# Patient Record
Sex: Female | Born: 1940 | Race: White | Hispanic: No | Smoking: Former smoker
Health system: Southern US, Community
[De-identification: ages and names within clinical notes are randomized; demographics above are authoritative.]

## PROBLEM LIST (undated history)

## (undated) DIAGNOSIS — N63 Unspecified lump in unspecified breast: Secondary | ICD-10-CM

## (undated) DIAGNOSIS — M1990 Unspecified osteoarthritis, unspecified site: Secondary | ICD-10-CM

## (undated) DIAGNOSIS — K219 Gastro-esophageal reflux disease without esophagitis: Secondary | ICD-10-CM

## (undated) DIAGNOSIS — N83209 Unspecified ovarian cyst, unspecified side: Secondary | ICD-10-CM

## (undated) DIAGNOSIS — F419 Anxiety disorder, unspecified: Secondary | ICD-10-CM

## (undated) DIAGNOSIS — M797 Fibromyalgia: Secondary | ICD-10-CM

## (undated) DIAGNOSIS — I6522 Occlusion and stenosis of left carotid artery: Secondary | ICD-10-CM

## (undated) DIAGNOSIS — I10 Essential (primary) hypertension: Secondary | ICD-10-CM

## (undated) DIAGNOSIS — J449 Chronic obstructive pulmonary disease, unspecified: Secondary | ICD-10-CM

## (undated) DIAGNOSIS — Z973 Presence of spectacles and contact lenses: Secondary | ICD-10-CM

## (undated) DIAGNOSIS — G629 Polyneuropathy, unspecified: Secondary | ICD-10-CM

## (undated) DIAGNOSIS — F32A Depression, unspecified: Secondary | ICD-10-CM

## (undated) DIAGNOSIS — F329 Major depressive disorder, single episode, unspecified: Secondary | ICD-10-CM

## (undated) DIAGNOSIS — K760 Fatty (change of) liver, not elsewhere classified: Secondary | ICD-10-CM

## (undated) DIAGNOSIS — C50919 Malignant neoplasm of unspecified site of unspecified female breast: Secondary | ICD-10-CM

## (undated) DIAGNOSIS — J45909 Unspecified asthma, uncomplicated: Secondary | ICD-10-CM

## (undated) DIAGNOSIS — K5790 Diverticulosis of intestine, part unspecified, without perforation or abscess without bleeding: Secondary | ICD-10-CM

## (undated) DIAGNOSIS — Z8719 Personal history of other diseases of the digestive system: Secondary | ICD-10-CM

## (undated) DIAGNOSIS — G43909 Migraine, unspecified, not intractable, without status migrainosus: Secondary | ICD-10-CM

## (undated) DIAGNOSIS — B009 Herpesviral infection, unspecified: Secondary | ICD-10-CM

## (undated) HISTORY — PX: MYOMECTOMY: SHX85

## (undated) HISTORY — DX: Unspecified ovarian cyst, unspecified side: N83.209

## (undated) HISTORY — PX: CARPAL TUNNEL RELEASE: SHX101

## (undated) HISTORY — DX: Depression, unspecified: F32.A

## (undated) HISTORY — DX: Unspecified lump in unspecified breast: N63.0

## (undated) HISTORY — DX: Diverticulosis of intestine, part unspecified, without perforation or abscess without bleeding: K57.90

## (undated) HISTORY — DX: Polyneuropathy, unspecified: G62.9

## (undated) HISTORY — PX: TONSILLECTOMY: SUR1361

## (undated) HISTORY — DX: Herpesviral infection, unspecified: B00.9

## (undated) HISTORY — DX: Essential (primary) hypertension: I10

## (undated) HISTORY — PX: ABDOMINAL HYSTERECTOMY: SHX81

## (undated) HISTORY — PX: BACK SURGERY: SHX140

## (undated) HISTORY — DX: Major depressive disorder, single episode, unspecified: F32.9

## (undated) HISTORY — PX: APPENDECTOMY: SHX54

## (undated) HISTORY — PX: BREAST DUCTAL SYSTEM EXCISION: SHX5242

---

## 1997-05-18 ENCOUNTER — Other Ambulatory Visit: Admission: RE | Admit: 1997-05-18 | Discharge: 1997-05-18 | Payer: Self-pay | Admitting: *Deleted

## 1997-06-01 ENCOUNTER — Other Ambulatory Visit: Admission: RE | Admit: 1997-06-01 | Discharge: 1997-06-01 | Payer: Self-pay | Admitting: *Deleted

## 1997-09-20 ENCOUNTER — Encounter: Admission: RE | Admit: 1997-09-20 | Discharge: 1997-12-19 | Payer: Self-pay

## 1998-03-28 ENCOUNTER — Inpatient Hospital Stay (HOSPITAL_COMMUNITY): Admission: AD | Admit: 1998-03-28 | Discharge: 1998-04-01 | Payer: Self-pay | Admitting: *Deleted

## 1998-03-28 ENCOUNTER — Encounter: Payer: Self-pay | Admitting: *Deleted

## 1999-05-09 ENCOUNTER — Encounter: Admission: RE | Admit: 1999-05-09 | Discharge: 1999-05-09 | Payer: Self-pay | Admitting: Family Medicine

## 1999-05-09 ENCOUNTER — Encounter: Payer: Self-pay | Admitting: Family Medicine

## 1999-05-23 ENCOUNTER — Encounter: Payer: Self-pay | Admitting: Family Medicine

## 1999-05-23 ENCOUNTER — Encounter: Admission: RE | Admit: 1999-05-23 | Discharge: 1999-05-23 | Payer: Self-pay | Admitting: Family Medicine

## 1999-07-21 ENCOUNTER — Encounter: Payer: Self-pay | Admitting: Family Medicine

## 1999-07-21 ENCOUNTER — Ambulatory Visit (HOSPITAL_COMMUNITY): Admission: RE | Admit: 1999-07-21 | Discharge: 1999-07-21 | Payer: Self-pay | Admitting: Family Medicine

## 2001-02-18 ENCOUNTER — Encounter: Payer: Self-pay | Admitting: Internal Medicine

## 2001-02-18 ENCOUNTER — Ambulatory Visit (HOSPITAL_COMMUNITY): Admission: RE | Admit: 2001-02-18 | Discharge: 2001-02-18 | Payer: Self-pay | Admitting: Internal Medicine

## 2001-02-18 DIAGNOSIS — K648 Other hemorrhoids: Secondary | ICD-10-CM | POA: Insufficient documentation

## 2001-02-18 DIAGNOSIS — K5730 Diverticulosis of large intestine without perforation or abscess without bleeding: Secondary | ICD-10-CM | POA: Insufficient documentation

## 2001-02-18 DIAGNOSIS — K2940 Chronic atrophic gastritis without bleeding: Secondary | ICD-10-CM | POA: Insufficient documentation

## 2001-04-28 ENCOUNTER — Ambulatory Visit (HOSPITAL_COMMUNITY): Admission: RE | Admit: 2001-04-28 | Discharge: 2001-04-28 | Payer: Self-pay | Admitting: Family Medicine

## 2001-04-28 ENCOUNTER — Encounter: Payer: Self-pay | Admitting: Family Medicine

## 2001-06-20 ENCOUNTER — Encounter: Payer: Self-pay | Admitting: Neurological Surgery

## 2001-06-20 ENCOUNTER — Encounter: Admission: RE | Admit: 2001-06-20 | Discharge: 2001-06-20 | Payer: Self-pay | Admitting: Neurological Surgery

## 2001-09-01 ENCOUNTER — Inpatient Hospital Stay (HOSPITAL_COMMUNITY): Admission: EM | Admit: 2001-09-01 | Discharge: 2001-09-06 | Payer: Self-pay | Admitting: Emergency Medicine

## 2001-09-01 ENCOUNTER — Encounter: Payer: Self-pay | Admitting: Emergency Medicine

## 2001-09-02 ENCOUNTER — Encounter: Payer: Self-pay | Admitting: Internal Medicine

## 2001-09-04 ENCOUNTER — Encounter: Payer: Self-pay | Admitting: Internal Medicine

## 2001-09-05 ENCOUNTER — Encounter: Payer: Self-pay | Admitting: Internal Medicine

## 2001-10-08 ENCOUNTER — Encounter: Admission: RE | Admit: 2001-10-08 | Discharge: 2001-10-08 | Payer: Self-pay | Admitting: Family Medicine

## 2001-10-08 ENCOUNTER — Encounter: Payer: Self-pay | Admitting: Family Medicine

## 2002-03-05 ENCOUNTER — Encounter: Payer: Self-pay | Admitting: Neurological Surgery

## 2002-03-05 ENCOUNTER — Encounter: Admission: RE | Admit: 2002-03-05 | Discharge: 2002-03-05 | Payer: Self-pay | Admitting: Neurological Surgery

## 2002-09-14 ENCOUNTER — Ambulatory Visit (HOSPITAL_COMMUNITY): Admission: RE | Admit: 2002-09-14 | Discharge: 2002-09-14 | Payer: Self-pay | Admitting: Family Medicine

## 2002-09-14 ENCOUNTER — Encounter: Payer: Self-pay | Admitting: Family Medicine

## 2002-12-21 ENCOUNTER — Emergency Department (HOSPITAL_COMMUNITY): Admission: EM | Admit: 2002-12-21 | Discharge: 2002-12-21 | Payer: Self-pay | Admitting: Emergency Medicine

## 2003-08-05 ENCOUNTER — Inpatient Hospital Stay (HOSPITAL_COMMUNITY): Admission: AD | Admit: 2003-08-05 | Discharge: 2003-08-12 | Payer: Self-pay | Admitting: Internal Medicine

## 2003-08-10 ENCOUNTER — Encounter (INDEPENDENT_AMBULATORY_CARE_PROVIDER_SITE_OTHER): Payer: Self-pay | Admitting: Cardiology

## 2003-08-26 ENCOUNTER — Encounter: Admission: RE | Admit: 2003-08-26 | Discharge: 2003-08-26 | Payer: Self-pay | Admitting: Internal Medicine

## 2003-09-02 HISTORY — PX: ANTERIOR CERVICAL DECOMP/DISCECTOMY FUSION: SHX1161

## 2004-01-07 ENCOUNTER — Ambulatory Visit: Payer: Self-pay | Admitting: Internal Medicine

## 2004-01-14 ENCOUNTER — Ambulatory Visit: Payer: Self-pay | Admitting: Family Medicine

## 2004-01-24 ENCOUNTER — Ambulatory Visit: Payer: Self-pay | Admitting: Internal Medicine

## 2004-02-10 ENCOUNTER — Ambulatory Visit: Payer: Self-pay | Admitting: Internal Medicine

## 2004-02-14 ENCOUNTER — Ambulatory Visit: Payer: Self-pay | Admitting: Family Medicine

## 2004-03-03 ENCOUNTER — Ambulatory Visit: Payer: Self-pay | Admitting: Family Medicine

## 2004-04-05 ENCOUNTER — Ambulatory Visit: Payer: Self-pay | Admitting: Family Medicine

## 2004-04-07 ENCOUNTER — Encounter: Admission: RE | Admit: 2004-04-07 | Discharge: 2004-04-07 | Payer: Self-pay | Admitting: Family Medicine

## 2004-04-21 ENCOUNTER — Encounter: Admission: RE | Admit: 2004-04-21 | Discharge: 2004-04-21 | Payer: Self-pay | Admitting: Family Medicine

## 2004-05-12 ENCOUNTER — Encounter: Admission: RE | Admit: 2004-05-12 | Discharge: 2004-05-12 | Payer: Self-pay | Admitting: Family Medicine

## 2004-07-14 ENCOUNTER — Emergency Department (HOSPITAL_COMMUNITY): Admission: EM | Admit: 2004-07-14 | Discharge: 2004-07-14 | Payer: Self-pay | Admitting: Emergency Medicine

## 2004-07-17 ENCOUNTER — Ambulatory Visit (HOSPITAL_COMMUNITY): Admission: RE | Admit: 2004-07-17 | Discharge: 2004-07-17 | Payer: Self-pay | Admitting: Anesthesiology

## 2004-08-30 ENCOUNTER — Ambulatory Visit: Payer: Self-pay | Admitting: Family Medicine

## 2004-09-01 ENCOUNTER — Ambulatory Visit: Payer: Self-pay | Admitting: Internal Medicine

## 2004-09-05 ENCOUNTER — Encounter (INDEPENDENT_AMBULATORY_CARE_PROVIDER_SITE_OTHER): Payer: Self-pay | Admitting: Specialist

## 2004-09-05 ENCOUNTER — Ambulatory Visit: Payer: Self-pay | Admitting: Internal Medicine

## 2004-09-05 DIAGNOSIS — K449 Diaphragmatic hernia without obstruction or gangrene: Secondary | ICD-10-CM | POA: Insufficient documentation

## 2004-09-05 DIAGNOSIS — K209 Esophagitis, unspecified without bleeding: Secondary | ICD-10-CM | POA: Insufficient documentation

## 2004-09-14 ENCOUNTER — Inpatient Hospital Stay (HOSPITAL_COMMUNITY): Admission: RE | Admit: 2004-09-14 | Discharge: 2004-09-16 | Payer: Self-pay | Admitting: Neurological Surgery

## 2004-10-01 ENCOUNTER — Emergency Department (HOSPITAL_COMMUNITY): Admission: EM | Admit: 2004-10-01 | Discharge: 2004-10-02 | Payer: Self-pay | Admitting: Emergency Medicine

## 2004-10-20 ENCOUNTER — Ambulatory Visit: Payer: Self-pay | Admitting: Family Medicine

## 2004-11-27 ENCOUNTER — Encounter: Admission: RE | Admit: 2004-11-27 | Discharge: 2005-02-25 | Payer: Self-pay | Admitting: Neurological Surgery

## 2005-01-22 ENCOUNTER — Ambulatory Visit (HOSPITAL_COMMUNITY): Admission: RE | Admit: 2005-01-22 | Discharge: 2005-01-22 | Payer: Self-pay | Admitting: Neurological Surgery

## 2005-01-26 ENCOUNTER — Ambulatory Visit (HOSPITAL_COMMUNITY): Admission: RE | Admit: 2005-01-26 | Discharge: 2005-01-26 | Payer: Self-pay | Admitting: Neurological Surgery

## 2005-02-08 ENCOUNTER — Ambulatory Visit: Payer: Self-pay | Admitting: Family Medicine

## 2005-02-15 ENCOUNTER — Ambulatory Visit: Payer: Self-pay | Admitting: Family Medicine

## 2005-04-05 ENCOUNTER — Ambulatory Visit: Payer: Self-pay | Admitting: Family Medicine

## 2005-04-30 ENCOUNTER — Ambulatory Visit: Payer: Self-pay | Admitting: Family Medicine

## 2005-05-07 ENCOUNTER — Ambulatory Visit: Payer: Self-pay | Admitting: Internal Medicine

## 2005-09-05 ENCOUNTER — Ambulatory Visit: Payer: Self-pay | Admitting: Family Medicine

## 2005-09-26 ENCOUNTER — Inpatient Hospital Stay (HOSPITAL_COMMUNITY): Admission: RE | Admit: 2005-09-26 | Discharge: 2005-09-28 | Payer: Self-pay | Admitting: Orthopaedic Surgery

## 2005-11-09 ENCOUNTER — Ambulatory Visit: Payer: Self-pay | Admitting: Family Medicine

## 2006-01-24 ENCOUNTER — Ambulatory Visit: Payer: Self-pay | Admitting: Family Medicine

## 2006-01-24 LAB — CONVERTED CEMR LAB

## 2006-01-31 ENCOUNTER — Encounter: Admission: RE | Admit: 2006-01-31 | Discharge: 2006-01-31 | Payer: Self-pay | Admitting: Family Medicine

## 2006-02-25 ENCOUNTER — Ambulatory Visit: Payer: Self-pay | Admitting: Family Medicine

## 2006-03-07 ENCOUNTER — Encounter: Admission: RE | Admit: 2006-03-07 | Discharge: 2006-03-07 | Payer: Self-pay | Admitting: Orthopaedic Surgery

## 2006-03-14 ENCOUNTER — Encounter: Admission: RE | Admit: 2006-03-14 | Discharge: 2006-03-14 | Payer: Self-pay | Admitting: Orthopaedic Surgery

## 2006-06-19 ENCOUNTER — Ambulatory Visit (HOSPITAL_COMMUNITY): Admission: RE | Admit: 2006-06-19 | Discharge: 2006-06-19 | Payer: Self-pay | Admitting: Orthopaedic Surgery

## 2006-06-19 ENCOUNTER — Ambulatory Visit: Payer: Self-pay | Admitting: Vascular Surgery

## 2006-06-19 ENCOUNTER — Ambulatory Visit: Payer: Self-pay | Admitting: Family Medicine

## 2006-06-19 ENCOUNTER — Encounter (INDEPENDENT_AMBULATORY_CARE_PROVIDER_SITE_OTHER): Payer: Self-pay | Admitting: Orthopaedic Surgery

## 2006-07-10 ENCOUNTER — Encounter: Payer: Self-pay | Admitting: Family Medicine

## 2006-08-06 ENCOUNTER — Telehealth (INDEPENDENT_AMBULATORY_CARE_PROVIDER_SITE_OTHER): Payer: Self-pay | Admitting: *Deleted

## 2006-09-04 ENCOUNTER — Ambulatory Visit: Payer: Self-pay | Admitting: Family Medicine

## 2006-09-04 DIAGNOSIS — M47812 Spondylosis without myelopathy or radiculopathy, cervical region: Secondary | ICD-10-CM | POA: Insufficient documentation

## 2006-09-04 DIAGNOSIS — G43009 Migraine without aura, not intractable, without status migrainosus: Secondary | ICD-10-CM | POA: Insufficient documentation

## 2006-09-04 DIAGNOSIS — E785 Hyperlipidemia, unspecified: Secondary | ICD-10-CM | POA: Insufficient documentation

## 2006-09-04 DIAGNOSIS — I10 Essential (primary) hypertension: Secondary | ICD-10-CM

## 2006-09-04 DIAGNOSIS — Z8679 Personal history of other diseases of the circulatory system: Secondary | ICD-10-CM | POA: Insufficient documentation

## 2006-09-04 DIAGNOSIS — E11649 Type 2 diabetes mellitus with hypoglycemia without coma: Secondary | ICD-10-CM | POA: Insufficient documentation

## 2006-09-04 DIAGNOSIS — M1990 Unspecified osteoarthritis, unspecified site: Secondary | ICD-10-CM | POA: Insufficient documentation

## 2006-09-04 LAB — CONVERTED CEMR LAB
Glucose, Urine, Semiquant: NEGATIVE
Protein, U semiquant: NEGATIVE
WBC Urine, dipstick: NEGATIVE
pH: 6.5

## 2006-09-10 ENCOUNTER — Encounter: Payer: Self-pay | Admitting: Family Medicine

## 2006-09-10 ENCOUNTER — Ambulatory Visit: Payer: Self-pay

## 2006-09-17 ENCOUNTER — Telehealth: Payer: Self-pay | Admitting: Family Medicine

## 2006-09-17 ENCOUNTER — Encounter: Payer: Self-pay | Admitting: Family Medicine

## 2006-09-17 DIAGNOSIS — K5289 Other specified noninfective gastroenteritis and colitis: Secondary | ICD-10-CM

## 2006-09-17 LAB — CONVERTED CEMR LAB
Basophils Relative: 0.7 % (ref 0.0–1.0)
CO2: 32 meq/L (ref 19–32)
Creatinine, Ser: 0.5 mg/dL (ref 0.4–1.2)
Creatinine,U: 69 mg/dL
Eosinophils Relative: 2.6 % (ref 0.0–5.0)
Glucose, Bld: 125 mg/dL — ABNORMAL HIGH (ref 70–99)
HCT: 40.3 % (ref 36.0–46.0)
Hemoglobin: 14.4 g/dL (ref 12.0–15.0)
MCHC: 35.7 g/dL (ref 30.0–36.0)
Microalb, Ur: 1.3 mg/dL (ref 0.0–1.9)
Monocytes Absolute: 0.4 10*3/uL (ref 0.2–0.7)
Neutrophils Relative %: 52 % (ref 43.0–77.0)
Potassium: 3.3 meq/L — ABNORMAL LOW (ref 3.5–5.1)
RDW: 13.1 % (ref 11.5–14.6)
Sodium: 143 meq/L (ref 135–145)
TSH: 1.58 microintl units/mL (ref 0.35–5.50)
Total Bilirubin: 0.8 mg/dL (ref 0.3–1.2)
Total CHOL/HDL Ratio: 4.9
Total Protein: 6.4 g/dL (ref 6.0–8.3)
VLDL: 50 mg/dL — ABNORMAL HIGH (ref 0–40)
WBC: 4.5 10*3/uL (ref 4.5–10.5)

## 2006-09-19 ENCOUNTER — Ambulatory Visit: Payer: Self-pay

## 2006-09-19 ENCOUNTER — Ambulatory Visit: Payer: Self-pay | Admitting: Family Medicine

## 2006-09-20 ENCOUNTER — Encounter (INDEPENDENT_AMBULATORY_CARE_PROVIDER_SITE_OTHER): Payer: Self-pay | Admitting: *Deleted

## 2006-10-03 ENCOUNTER — Ambulatory Visit: Payer: Self-pay | Admitting: Family Medicine

## 2006-10-15 ENCOUNTER — Telehealth (INDEPENDENT_AMBULATORY_CARE_PROVIDER_SITE_OTHER): Payer: Self-pay | Admitting: *Deleted

## 2006-10-16 ENCOUNTER — Ambulatory Visit: Payer: Self-pay | Admitting: Family Medicine

## 2006-10-16 DIAGNOSIS — J019 Acute sinusitis, unspecified: Secondary | ICD-10-CM

## 2006-10-22 ENCOUNTER — Telehealth (INDEPENDENT_AMBULATORY_CARE_PROVIDER_SITE_OTHER): Payer: Self-pay | Admitting: *Deleted

## 2006-10-30 ENCOUNTER — Ambulatory Visit: Payer: Self-pay | Admitting: Internal Medicine

## 2006-11-01 ENCOUNTER — Ambulatory Visit: Payer: Self-pay | Admitting: Internal Medicine

## 2006-11-05 ENCOUNTER — Telehealth (INDEPENDENT_AMBULATORY_CARE_PROVIDER_SITE_OTHER): Payer: Self-pay | Admitting: *Deleted

## 2006-11-27 ENCOUNTER — Ambulatory Visit: Payer: Self-pay | Admitting: Family Medicine

## 2006-12-09 ENCOUNTER — Ambulatory Visit: Payer: Self-pay | Admitting: Family Medicine

## 2006-12-16 ENCOUNTER — Ambulatory Visit: Payer: Self-pay | Admitting: Family Medicine

## 2006-12-19 ENCOUNTER — Telehealth (INDEPENDENT_AMBULATORY_CARE_PROVIDER_SITE_OTHER): Payer: Self-pay | Admitting: *Deleted

## 2006-12-24 LAB — CONVERTED CEMR LAB
ALT: 39 units/L — ABNORMAL HIGH (ref 0–35)
AST: 29 units/L (ref 0–37)
Alkaline Phosphatase: 186 units/L — ABNORMAL HIGH (ref 39–117)
HDL: 44.8 mg/dL (ref >39.0)
LDL Cholesterol: 117 mg/dL — ABNORMAL HIGH (ref 0–99)
Total Protein: 7.3 g/dL (ref 6.0–8.3)

## 2006-12-25 ENCOUNTER — Encounter (INDEPENDENT_AMBULATORY_CARE_PROVIDER_SITE_OTHER): Payer: Self-pay | Admitting: *Deleted

## 2006-12-30 ENCOUNTER — Encounter: Admission: RE | Admit: 2006-12-30 | Discharge: 2006-12-30 | Payer: Self-pay | Admitting: Internal Medicine

## 2007-01-06 ENCOUNTER — Ambulatory Visit: Payer: Self-pay | Admitting: Internal Medicine

## 2007-01-07 ENCOUNTER — Telehealth (INDEPENDENT_AMBULATORY_CARE_PROVIDER_SITE_OTHER): Payer: Self-pay | Admitting: *Deleted

## 2007-01-08 ENCOUNTER — Encounter: Payer: Self-pay | Admitting: Family Medicine

## 2007-01-08 ENCOUNTER — Ambulatory Visit: Payer: Self-pay | Admitting: Internal Medicine

## 2007-01-10 ENCOUNTER — Telehealth (INDEPENDENT_AMBULATORY_CARE_PROVIDER_SITE_OTHER): Payer: Self-pay | Admitting: *Deleted

## 2007-01-10 DIAGNOSIS — R945 Abnormal results of liver function studies: Secondary | ICD-10-CM

## 2007-01-14 ENCOUNTER — Ambulatory Visit: Payer: Self-pay | Admitting: Family Medicine

## 2007-01-16 ENCOUNTER — Telehealth (INDEPENDENT_AMBULATORY_CARE_PROVIDER_SITE_OTHER): Payer: Self-pay | Admitting: *Deleted

## 2007-01-24 ENCOUNTER — Encounter: Admission: RE | Admit: 2007-01-24 | Discharge: 2007-01-24 | Payer: Self-pay | Admitting: Family Medicine

## 2007-01-28 ENCOUNTER — Telehealth (INDEPENDENT_AMBULATORY_CARE_PROVIDER_SITE_OTHER): Payer: Self-pay | Admitting: *Deleted

## 2007-01-30 ENCOUNTER — Telehealth (INDEPENDENT_AMBULATORY_CARE_PROVIDER_SITE_OTHER): Payer: Self-pay | Admitting: *Deleted

## 2007-01-31 ENCOUNTER — Ambulatory Visit: Payer: Self-pay | Admitting: Family Medicine

## 2007-01-31 DIAGNOSIS — E782 Mixed hyperlipidemia: Secondary | ICD-10-CM | POA: Insufficient documentation

## 2007-02-14 ENCOUNTER — Encounter: Payer: Self-pay | Admitting: Family Medicine

## 2007-02-19 ENCOUNTER — Encounter: Payer: Self-pay | Admitting: Family Medicine

## 2007-02-20 ENCOUNTER — Telehealth (INDEPENDENT_AMBULATORY_CARE_PROVIDER_SITE_OTHER): Payer: Self-pay | Admitting: *Deleted

## 2007-02-21 ENCOUNTER — Ambulatory Visit: Payer: Self-pay | Admitting: Family Medicine

## 2007-02-21 DIAGNOSIS — J209 Acute bronchitis, unspecified: Secondary | ICD-10-CM

## 2007-02-25 ENCOUNTER — Telehealth (INDEPENDENT_AMBULATORY_CARE_PROVIDER_SITE_OTHER): Payer: Self-pay | Admitting: *Deleted

## 2007-02-25 ENCOUNTER — Ambulatory Visit: Payer: Self-pay | Admitting: Family Medicine

## 2007-02-26 DIAGNOSIS — IMO0002 Reserved for concepts with insufficient information to code with codable children: Secondary | ICD-10-CM

## 2007-02-26 DIAGNOSIS — J45909 Unspecified asthma, uncomplicated: Secondary | ICD-10-CM | POA: Insufficient documentation

## 2007-02-26 DIAGNOSIS — G43909 Migraine, unspecified, not intractable, without status migrainosus: Secondary | ICD-10-CM | POA: Insufficient documentation

## 2007-02-26 DIAGNOSIS — E669 Obesity, unspecified: Secondary | ICD-10-CM | POA: Insufficient documentation

## 2007-02-26 DIAGNOSIS — F329 Major depressive disorder, single episode, unspecified: Secondary | ICD-10-CM

## 2007-02-26 DIAGNOSIS — K219 Gastro-esophageal reflux disease without esophagitis: Secondary | ICD-10-CM

## 2007-02-26 DIAGNOSIS — IMO0001 Reserved for inherently not codable concepts without codable children: Secondary | ICD-10-CM

## 2007-02-26 DIAGNOSIS — J309 Allergic rhinitis, unspecified: Secondary | ICD-10-CM | POA: Insufficient documentation

## 2007-02-26 DIAGNOSIS — Z8679 Personal history of other diseases of the circulatory system: Secondary | ICD-10-CM

## 2007-02-28 ENCOUNTER — Telehealth (INDEPENDENT_AMBULATORY_CARE_PROVIDER_SITE_OTHER): Payer: Self-pay | Admitting: *Deleted

## 2007-03-07 ENCOUNTER — Telehealth (INDEPENDENT_AMBULATORY_CARE_PROVIDER_SITE_OTHER): Payer: Self-pay | Admitting: *Deleted

## 2007-03-14 ENCOUNTER — Telehealth (INDEPENDENT_AMBULATORY_CARE_PROVIDER_SITE_OTHER): Payer: Self-pay | Admitting: *Deleted

## 2007-03-20 ENCOUNTER — Telehealth: Payer: Self-pay | Admitting: Family Medicine

## 2007-03-25 ENCOUNTER — Ambulatory Visit: Payer: Self-pay | Admitting: Family Medicine

## 2007-03-27 ENCOUNTER — Telehealth (INDEPENDENT_AMBULATORY_CARE_PROVIDER_SITE_OTHER): Payer: Self-pay | Admitting: *Deleted

## 2007-03-28 ENCOUNTER — Ambulatory Visit: Payer: Self-pay | Admitting: Family Medicine

## 2007-04-05 LAB — CONVERTED CEMR LAB
Albumin: 3.3 g/dL — ABNORMAL LOW (ref 3.5–5.2)
BUN: 19 mg/dL (ref 6–23)
Cholesterol: 173 mg/dL (ref 0–200)
Creatinine, Ser: 0.7 mg/dL (ref 0.4–1.2)
GFR calc Af Amer: 108 mL/min
GFR calc non Af Amer: 89 mL/min
Hgb A1c MFr Bld: 9 % — ABNORMAL HIGH (ref 4.6–6.0)
LDL Cholesterol: 92 mg/dL (ref 0–99)
Potassium: 4.5 meq/L (ref 3.5–5.1)
Total CHOL/HDL Ratio: 3.9
Triglycerides: 184 mg/dL — ABNORMAL HIGH (ref 0–149)
VLDL: 37 mg/dL (ref 0–40)

## 2007-04-07 ENCOUNTER — Encounter (INDEPENDENT_AMBULATORY_CARE_PROVIDER_SITE_OTHER): Payer: Self-pay | Admitting: *Deleted

## 2007-04-08 ENCOUNTER — Telehealth: Payer: Self-pay | Admitting: Family Medicine

## 2007-04-08 ENCOUNTER — Encounter (INDEPENDENT_AMBULATORY_CARE_PROVIDER_SITE_OTHER): Payer: Self-pay | Admitting: *Deleted

## 2007-04-15 ENCOUNTER — Ambulatory Visit: Payer: Self-pay | Admitting: Family Medicine

## 2007-04-15 ENCOUNTER — Encounter (INDEPENDENT_AMBULATORY_CARE_PROVIDER_SITE_OTHER): Payer: Self-pay | Admitting: *Deleted

## 2007-04-15 ENCOUNTER — Telehealth (INDEPENDENT_AMBULATORY_CARE_PROVIDER_SITE_OTHER): Payer: Self-pay | Admitting: *Deleted

## 2007-04-16 ENCOUNTER — Telehealth (INDEPENDENT_AMBULATORY_CARE_PROVIDER_SITE_OTHER): Payer: Self-pay | Admitting: *Deleted

## 2007-04-16 ENCOUNTER — Encounter: Payer: Self-pay | Admitting: Internal Medicine

## 2007-04-16 ENCOUNTER — Encounter (INDEPENDENT_AMBULATORY_CARE_PROVIDER_SITE_OTHER): Payer: Self-pay | Admitting: *Deleted

## 2007-04-16 LAB — CONVERTED CEMR LAB
Alkaline Phosphatase: 114 units/L (ref 39–117)
Bilirubin, Direct: 0.1 mg/dL (ref 0.0–0.3)

## 2007-04-17 ENCOUNTER — Encounter (INDEPENDENT_AMBULATORY_CARE_PROVIDER_SITE_OTHER): Payer: Self-pay | Admitting: *Deleted

## 2007-04-18 ENCOUNTER — Telehealth (INDEPENDENT_AMBULATORY_CARE_PROVIDER_SITE_OTHER): Payer: Self-pay | Admitting: *Deleted

## 2007-04-21 ENCOUNTER — Ambulatory Visit: Payer: Self-pay | Admitting: Family Medicine

## 2007-04-21 DIAGNOSIS — R5381 Other malaise: Secondary | ICD-10-CM | POA: Insufficient documentation

## 2007-04-21 DIAGNOSIS — R5383 Other fatigue: Secondary | ICD-10-CM

## 2007-04-21 DIAGNOSIS — R609 Edema, unspecified: Secondary | ICD-10-CM

## 2007-04-21 DIAGNOSIS — I739 Peripheral vascular disease, unspecified: Secondary | ICD-10-CM

## 2007-04-22 LAB — CONVERTED CEMR LAB
BUN: 16 mg/dL (ref 6–23)
Basophils Absolute: 0 10*3/uL (ref 0.0–0.1)
Basophils Relative: 0.1 % (ref 0.0–1.0)
Eosinophils Absolute: 0.3 10*3/uL (ref 0.0–0.7)
GFR calc Af Amer: 108 mL/min
Glucose, Bld: 374 mg/dL — ABNORMAL HIGH (ref 70–99)
HCT: 43.5 % (ref 36.0–46.0)
Hemoglobin: 14.9 g/dL (ref 12.0–15.0)
MCHC: 34.3 g/dL (ref 30.0–36.0)
MCV: 98.1 fL (ref 78.0–100.0)
Neutro Abs: 0.8 10*3/uL — ABNORMAL LOW (ref 1.4–7.7)
Potassium: 4 meq/L (ref 3.5–5.1)
RBC: 4.44 M/uL (ref 3.87–5.11)

## 2007-04-23 ENCOUNTER — Ambulatory Visit: Payer: Self-pay

## 2007-04-23 ENCOUNTER — Encounter: Payer: Self-pay | Admitting: Family Medicine

## 2007-04-23 ENCOUNTER — Encounter (INDEPENDENT_AMBULATORY_CARE_PROVIDER_SITE_OTHER): Payer: Self-pay | Admitting: *Deleted

## 2007-04-29 ENCOUNTER — Ambulatory Visit: Payer: Self-pay | Admitting: Internal Medicine

## 2007-04-29 ENCOUNTER — Encounter: Payer: Self-pay | Admitting: Internal Medicine

## 2007-05-01 ENCOUNTER — Encounter: Payer: Self-pay | Admitting: Family Medicine

## 2007-05-02 ENCOUNTER — Telehealth (INDEPENDENT_AMBULATORY_CARE_PROVIDER_SITE_OTHER): Payer: Self-pay | Admitting: *Deleted

## 2007-05-05 ENCOUNTER — Telehealth (INDEPENDENT_AMBULATORY_CARE_PROVIDER_SITE_OTHER): Payer: Self-pay | Admitting: *Deleted

## 2007-05-05 ENCOUNTER — Ambulatory Visit: Payer: Self-pay | Admitting: Internal Medicine

## 2007-05-06 ENCOUNTER — Encounter: Payer: Self-pay | Admitting: Family Medicine

## 2007-05-08 ENCOUNTER — Telehealth (INDEPENDENT_AMBULATORY_CARE_PROVIDER_SITE_OTHER): Payer: Self-pay | Admitting: *Deleted

## 2007-06-02 ENCOUNTER — Telehealth (INDEPENDENT_AMBULATORY_CARE_PROVIDER_SITE_OTHER): Payer: Self-pay | Admitting: *Deleted

## 2007-06-06 ENCOUNTER — Ambulatory Visit: Payer: Self-pay | Admitting: Family Medicine

## 2007-06-06 ENCOUNTER — Telehealth (INDEPENDENT_AMBULATORY_CARE_PROVIDER_SITE_OTHER): Payer: Self-pay | Admitting: *Deleted

## 2007-06-09 ENCOUNTER — Encounter (INDEPENDENT_AMBULATORY_CARE_PROVIDER_SITE_OTHER): Payer: Self-pay | Admitting: *Deleted

## 2007-06-12 ENCOUNTER — Ambulatory Visit: Payer: Self-pay | Admitting: Family Medicine

## 2007-06-16 ENCOUNTER — Telehealth (INDEPENDENT_AMBULATORY_CARE_PROVIDER_SITE_OTHER): Payer: Self-pay | Admitting: *Deleted

## 2007-06-17 ENCOUNTER — Encounter (INDEPENDENT_AMBULATORY_CARE_PROVIDER_SITE_OTHER): Payer: Self-pay | Admitting: *Deleted

## 2007-06-25 ENCOUNTER — Ambulatory Visit: Payer: Self-pay | Admitting: Internal Medicine

## 2007-06-25 ENCOUNTER — Telehealth (INDEPENDENT_AMBULATORY_CARE_PROVIDER_SITE_OTHER): Payer: Self-pay | Admitting: *Deleted

## 2007-06-25 DIAGNOSIS — Z87891 Personal history of nicotine dependence: Secondary | ICD-10-CM

## 2007-06-25 DIAGNOSIS — R059 Cough, unspecified: Secondary | ICD-10-CM | POA: Insufficient documentation

## 2007-06-25 DIAGNOSIS — R05 Cough: Secondary | ICD-10-CM

## 2007-06-30 ENCOUNTER — Telehealth (INDEPENDENT_AMBULATORY_CARE_PROVIDER_SITE_OTHER): Payer: Self-pay | Admitting: *Deleted

## 2007-07-07 ENCOUNTER — Telehealth (INDEPENDENT_AMBULATORY_CARE_PROVIDER_SITE_OTHER): Payer: Self-pay | Admitting: *Deleted

## 2007-07-08 ENCOUNTER — Ambulatory Visit: Payer: Self-pay | Admitting: Family Medicine

## 2007-07-08 DIAGNOSIS — J069 Acute upper respiratory infection, unspecified: Secondary | ICD-10-CM | POA: Insufficient documentation

## 2007-07-09 ENCOUNTER — Telehealth (INDEPENDENT_AMBULATORY_CARE_PROVIDER_SITE_OTHER): Payer: Self-pay | Admitting: *Deleted

## 2007-07-10 ENCOUNTER — Ambulatory Visit: Payer: Self-pay | Admitting: Internal Medicine

## 2007-07-10 DIAGNOSIS — B370 Candidal stomatitis: Secondary | ICD-10-CM

## 2007-07-16 ENCOUNTER — Telehealth: Payer: Self-pay | Admitting: Internal Medicine

## 2007-07-21 ENCOUNTER — Ambulatory Visit: Payer: Self-pay | Admitting: Internal Medicine

## 2007-07-23 LAB — CONVERTED CEMR LAB
Basophils Absolute: 0.1 10*3/uL (ref 0.0–0.1)
Basophils Relative: 1.6 % (ref 0.0–3.0)
Eosinophils Absolute: 0.1 10*3/uL (ref 0.0–0.7)
MCHC: 35.2 g/dL (ref 30.0–36.0)
MCV: 95 fL (ref 78.0–100.0)
Monocytes Absolute: 0.4 10*3/uL (ref 0.1–1.0)
Neutro Abs: 3.4 10*3/uL (ref 1.4–7.7)
Neutrophils Relative %: 58.5 % (ref 43.0–77.0)
RBC: 4.56 M/uL (ref 3.87–5.11)

## 2007-07-24 ENCOUNTER — Ambulatory Visit: Payer: Self-pay | Admitting: Internal Medicine

## 2007-07-29 ENCOUNTER — Encounter: Payer: Self-pay | Admitting: Family Medicine

## 2007-08-11 ENCOUNTER — Ambulatory Visit: Payer: Self-pay | Admitting: Pulmonary Disease

## 2007-08-11 ENCOUNTER — Telehealth (INDEPENDENT_AMBULATORY_CARE_PROVIDER_SITE_OTHER): Payer: Self-pay | Admitting: *Deleted

## 2007-08-13 ENCOUNTER — Ambulatory Visit: Payer: Self-pay | Admitting: Cardiology

## 2007-08-15 ENCOUNTER — Telehealth (INDEPENDENT_AMBULATORY_CARE_PROVIDER_SITE_OTHER): Payer: Self-pay | Admitting: *Deleted

## 2007-09-02 ENCOUNTER — Ambulatory Visit: Payer: Self-pay | Admitting: Pulmonary Disease

## 2007-09-02 ENCOUNTER — Telehealth (INDEPENDENT_AMBULATORY_CARE_PROVIDER_SITE_OTHER): Payer: Self-pay | Admitting: *Deleted

## 2007-09-04 ENCOUNTER — Telehealth (INDEPENDENT_AMBULATORY_CARE_PROVIDER_SITE_OTHER): Payer: Self-pay | Admitting: *Deleted

## 2007-09-09 ENCOUNTER — Telehealth: Payer: Self-pay | Admitting: Internal Medicine

## 2007-09-09 ENCOUNTER — Ambulatory Visit: Payer: Self-pay | Admitting: Gastroenterology

## 2007-09-09 DIAGNOSIS — R110 Nausea: Secondary | ICD-10-CM

## 2007-09-09 DIAGNOSIS — K310 Acute dilatation of stomach: Secondary | ICD-10-CM

## 2007-09-09 DIAGNOSIS — R1013 Epigastric pain: Secondary | ICD-10-CM

## 2007-09-09 DIAGNOSIS — R1319 Other dysphagia: Secondary | ICD-10-CM

## 2007-09-09 DIAGNOSIS — R1084 Generalized abdominal pain: Secondary | ICD-10-CM | POA: Insufficient documentation

## 2007-09-09 DIAGNOSIS — K589 Irritable bowel syndrome without diarrhea: Secondary | ICD-10-CM

## 2007-09-10 ENCOUNTER — Ambulatory Visit (HOSPITAL_COMMUNITY): Admission: RE | Admit: 2007-09-10 | Discharge: 2007-09-10 | Payer: Self-pay | Admitting: Gastroenterology

## 2007-09-11 ENCOUNTER — Telehealth: Payer: Self-pay | Admitting: Physician Assistant

## 2007-09-11 LAB — CONVERTED CEMR LAB
ALT: 28 units/L (ref 0–35)
AST: 20 units/L (ref 0–37)
Albumin: 3.7 g/dL (ref 3.5–5.2)
Alkaline Phosphatase: 140 units/L — ABNORMAL HIGH (ref 39–117)
Basophils Absolute: 0.1 10*3/uL (ref 0.0–0.1)
Bilirubin Urine: NEGATIVE
Calcium: 9.7 mg/dL (ref 8.4–10.5)
Chloride: 97 meq/L (ref 96–112)
Eosinophils Absolute: 0.2 10*3/uL (ref 0.0–0.7)
Hemoglobin, Urine: NEGATIVE
Ketones, ur: NEGATIVE mg/dL
MCHC: 35.2 g/dL (ref 30.0–36.0)
MCV: 96.3 fL (ref 78.0–100.0)
Monocytes Absolute: 0.5 10*3/uL (ref 0.1–1.0)
Neutrophils Relative %: 55.9 % (ref 43.0–77.0)
Platelets: 282 10*3/uL (ref 150–400)
Potassium: 4.6 meq/L (ref 3.5–5.1)
RDW: 13 % (ref 11.5–14.6)
Sed Rate: 17 mm/hr (ref 0–22)
Urine Glucose: >=1000 mg/dL — CR
Urobilinogen, UA: 0.2 (ref 0.0–1.0)

## 2007-09-12 ENCOUNTER — Inpatient Hospital Stay (HOSPITAL_COMMUNITY): Admission: AD | Admit: 2007-09-12 | Discharge: 2007-09-18 | Payer: Self-pay | Admitting: Internal Medicine

## 2007-09-12 ENCOUNTER — Ambulatory Visit: Payer: Self-pay | Admitting: Internal Medicine

## 2007-09-18 ENCOUNTER — Encounter: Payer: Self-pay | Admitting: Family Medicine

## 2007-09-24 ENCOUNTER — Ambulatory Visit: Payer: Self-pay | Admitting: Family Medicine

## 2007-09-24 ENCOUNTER — Telehealth (INDEPENDENT_AMBULATORY_CARE_PROVIDER_SITE_OTHER): Payer: Self-pay | Admitting: *Deleted

## 2007-09-24 DIAGNOSIS — M549 Dorsalgia, unspecified: Secondary | ICD-10-CM | POA: Insufficient documentation

## 2007-09-24 DIAGNOSIS — F411 Generalized anxiety disorder: Secondary | ICD-10-CM | POA: Insufficient documentation

## 2007-09-24 DIAGNOSIS — K3184 Gastroparesis: Secondary | ICD-10-CM | POA: Insufficient documentation

## 2007-09-24 DIAGNOSIS — R079 Chest pain, unspecified: Secondary | ICD-10-CM

## 2007-09-29 ENCOUNTER — Encounter: Admission: RE | Admit: 2007-09-29 | Discharge: 2007-09-29 | Payer: Self-pay | Admitting: Family Medicine

## 2007-09-30 ENCOUNTER — Telehealth: Payer: Self-pay | Admitting: Family Medicine

## 2007-09-30 LAB — CONVERTED CEMR LAB
BUN: 16 mg/dL (ref 6–23)
Basophils Absolute: 0.1 10*3/uL (ref 0.0–0.1)
Basophils Relative: 3.1 % — ABNORMAL HIGH (ref 0.0–3.0)
CO2: 30 meq/L (ref 19–32)
Chloride: 103 meq/L (ref 96–112)
Eosinophils Relative: 4.7 % (ref 0.0–5.0)
GFR calc Af Amer: 129 mL/min
Glucose, Bld: 171 mg/dL — ABNORMAL HIGH (ref 70–99)
Hemoglobin: 15.6 g/dL — ABNORMAL HIGH (ref 12.0–15.0)
Lymphocytes Relative: 32.9 % (ref 12.0–46.0)
MCHC: 34.7 g/dL (ref 30.0–36.0)
MCV: 96.3 fL (ref 78.0–100.0)
Neutro Abs: 2.3 10*3/uL (ref 1.4–7.7)
Neutrophils Relative %: 50.5 % (ref 43.0–77.0)
Potassium: 3.9 meq/L (ref 3.5–5.1)
RBC: 4.67 M/uL (ref 3.87–5.11)
Sodium: 139 meq/L (ref 135–145)
Vit D, 1,25-Dihydroxy: 69 (ref 30–89)
WBC: 4.5 10*3/uL (ref 4.5–10.5)

## 2007-10-08 ENCOUNTER — Telehealth: Payer: Self-pay | Admitting: Internal Medicine

## 2007-10-13 ENCOUNTER — Encounter: Payer: Self-pay | Admitting: Family Medicine

## 2007-10-15 ENCOUNTER — Ambulatory Visit: Payer: Self-pay | Admitting: Family Medicine

## 2007-10-15 DIAGNOSIS — B029 Zoster without complications: Secondary | ICD-10-CM | POA: Insufficient documentation

## 2007-10-21 ENCOUNTER — Telehealth (INDEPENDENT_AMBULATORY_CARE_PROVIDER_SITE_OTHER): Payer: Self-pay | Admitting: *Deleted

## 2007-10-21 ENCOUNTER — Ambulatory Visit (HOSPITAL_COMMUNITY): Admission: RE | Admit: 2007-10-21 | Discharge: 2007-10-21 | Payer: Self-pay | Admitting: Family Medicine

## 2007-10-21 ENCOUNTER — Encounter (INDEPENDENT_AMBULATORY_CARE_PROVIDER_SITE_OTHER): Payer: Self-pay | Admitting: *Deleted

## 2007-10-22 ENCOUNTER — Ambulatory Visit: Payer: Self-pay | Admitting: Pulmonary Disease

## 2007-10-23 ENCOUNTER — Telehealth (INDEPENDENT_AMBULATORY_CARE_PROVIDER_SITE_OTHER): Payer: Self-pay | Admitting: *Deleted

## 2007-10-27 ENCOUNTER — Ambulatory Visit: Payer: Self-pay | Admitting: Family Medicine

## 2007-11-03 ENCOUNTER — Telehealth (INDEPENDENT_AMBULATORY_CARE_PROVIDER_SITE_OTHER): Payer: Self-pay | Admitting: *Deleted

## 2007-11-17 ENCOUNTER — Ambulatory Visit: Payer: Self-pay | Admitting: Family Medicine

## 2007-11-18 ENCOUNTER — Encounter (INDEPENDENT_AMBULATORY_CARE_PROVIDER_SITE_OTHER): Payer: Self-pay | Admitting: *Deleted

## 2007-11-21 ENCOUNTER — Telehealth (INDEPENDENT_AMBULATORY_CARE_PROVIDER_SITE_OTHER): Payer: Self-pay | Admitting: *Deleted

## 2007-11-24 ENCOUNTER — Encounter: Admission: RE | Admit: 2007-11-24 | Discharge: 2007-11-24 | Payer: Self-pay | Admitting: Family Medicine

## 2007-11-25 ENCOUNTER — Telehealth (INDEPENDENT_AMBULATORY_CARE_PROVIDER_SITE_OTHER): Payer: Self-pay | Admitting: *Deleted

## 2007-12-01 ENCOUNTER — Encounter: Payer: Self-pay | Admitting: Family Medicine

## 2007-12-03 ENCOUNTER — Telehealth (INDEPENDENT_AMBULATORY_CARE_PROVIDER_SITE_OTHER): Payer: Self-pay | Admitting: *Deleted

## 2007-12-09 ENCOUNTER — Telehealth (INDEPENDENT_AMBULATORY_CARE_PROVIDER_SITE_OTHER): Payer: Self-pay | Admitting: *Deleted

## 2007-12-10 ENCOUNTER — Encounter: Admission: RE | Admit: 2007-12-10 | Discharge: 2007-12-10 | Payer: Self-pay | Admitting: Neurology

## 2007-12-12 ENCOUNTER — Encounter: Payer: Self-pay | Admitting: Family Medicine

## 2007-12-16 ENCOUNTER — Ambulatory Visit: Payer: Self-pay | Admitting: Family Medicine

## 2007-12-18 ENCOUNTER — Encounter: Payer: Self-pay | Admitting: Family Medicine

## 2008-01-06 ENCOUNTER — Telehealth (INDEPENDENT_AMBULATORY_CARE_PROVIDER_SITE_OTHER): Payer: Self-pay | Admitting: *Deleted

## 2008-01-08 ENCOUNTER — Telehealth: Payer: Self-pay | Admitting: Pulmonary Disease

## 2008-01-15 ENCOUNTER — Ambulatory Visit: Payer: Self-pay | Admitting: Family Medicine

## 2008-01-19 ENCOUNTER — Telehealth: Payer: Self-pay | Admitting: Family Medicine

## 2008-01-21 ENCOUNTER — Encounter: Payer: Self-pay | Admitting: Family Medicine

## 2008-01-27 ENCOUNTER — Ambulatory Visit: Payer: Self-pay | Admitting: Family Medicine

## 2008-01-27 DIAGNOSIS — R81 Glycosuria: Secondary | ICD-10-CM

## 2008-01-27 LAB — CONVERTED CEMR LAB
Bilirubin Urine: NEGATIVE
Blood in Urine, dipstick: NEGATIVE
Glucose, Urine, Semiquant: >=1000
Protein, U semiquant: 30

## 2008-01-28 ENCOUNTER — Telehealth (INDEPENDENT_AMBULATORY_CARE_PROVIDER_SITE_OTHER): Payer: Self-pay | Admitting: *Deleted

## 2008-02-02 ENCOUNTER — Telehealth (INDEPENDENT_AMBULATORY_CARE_PROVIDER_SITE_OTHER): Payer: Self-pay | Admitting: *Deleted

## 2008-02-03 ENCOUNTER — Ambulatory Visit: Payer: Self-pay | Admitting: Family Medicine

## 2008-02-10 ENCOUNTER — Telehealth (INDEPENDENT_AMBULATORY_CARE_PROVIDER_SITE_OTHER): Payer: Self-pay | Admitting: *Deleted

## 2008-02-11 ENCOUNTER — Telehealth (INDEPENDENT_AMBULATORY_CARE_PROVIDER_SITE_OTHER): Payer: Self-pay | Admitting: *Deleted

## 2008-02-11 ENCOUNTER — Ambulatory Visit: Payer: Self-pay | Admitting: Internal Medicine

## 2008-02-12 ENCOUNTER — Telehealth (INDEPENDENT_AMBULATORY_CARE_PROVIDER_SITE_OTHER): Payer: Self-pay | Admitting: *Deleted

## 2008-02-12 ENCOUNTER — Ambulatory Visit: Payer: Self-pay | Admitting: Family Medicine

## 2008-02-12 DIAGNOSIS — Z862 Personal history of diseases of the blood and blood-forming organs and certain disorders involving the immune mechanism: Secondary | ICD-10-CM

## 2008-02-12 DIAGNOSIS — Z8639 Personal history of other endocrine, nutritional and metabolic disease: Secondary | ICD-10-CM | POA: Insufficient documentation

## 2008-02-12 LAB — CONVERTED CEMR LAB
ALT: 41 units/L — ABNORMAL HIGH (ref 0–35)
Bilirubin, Direct: 0.2 mg/dL (ref 0.0–0.3)
CO2: 29 meq/L (ref 19–32)
Calcium: 9.5 mg/dL (ref 8.4–10.5)
Cholesterol: 215 mg/dL (ref 0–200)
Creatinine, Ser: 0.7 mg/dL (ref 0.4–1.2)
Creatinine,U: 138.6 mg/dL
Direct LDL: 114.7 mg/dL
GFR calc Af Amer: 108 mL/min
HDL: 46.1 mg/dL (ref >39.0)
Microalb Creat Ratio: 259 mg/g — ABNORMAL HIGH (ref 0.0–30.0)
Microalb, Ur: 35.9 mg/dL — ABNORMAL HIGH (ref 0.0–1.9)
Sodium: 142 meq/L (ref 135–145)
Total Bilirubin: 1.1 mg/dL (ref 0.3–1.2)
Total CHOL/HDL Ratio: 4.7
Triglycerides: 377 mg/dL (ref 0–149)
VLDL: 75 mg/dL — ABNORMAL HIGH (ref 0–40)

## 2008-02-13 ENCOUNTER — Telehealth: Payer: Self-pay | Admitting: Family Medicine

## 2008-02-13 ENCOUNTER — Telehealth: Payer: Self-pay | Admitting: Internal Medicine

## 2008-02-13 ENCOUNTER — Encounter: Payer: Self-pay | Admitting: Family Medicine

## 2008-02-13 ENCOUNTER — Emergency Department (HOSPITAL_COMMUNITY): Admission: EM | Admit: 2008-02-13 | Discharge: 2008-02-14 | Payer: Self-pay | Admitting: Emergency Medicine

## 2008-02-15 ENCOUNTER — Emergency Department (HOSPITAL_COMMUNITY): Admission: EM | Admit: 2008-02-15 | Discharge: 2008-02-15 | Payer: Self-pay | Admitting: Emergency Medicine

## 2008-02-17 ENCOUNTER — Ambulatory Visit: Payer: Self-pay | Admitting: Gastroenterology

## 2008-02-17 ENCOUNTER — Encounter (INDEPENDENT_AMBULATORY_CARE_PROVIDER_SITE_OTHER): Payer: Self-pay | Admitting: *Deleted

## 2008-02-17 DIAGNOSIS — R109 Unspecified abdominal pain: Secondary | ICD-10-CM

## 2008-02-17 LAB — CONVERTED CEMR LAB
Amylase: 34 units/L (ref 27–131)
Eosinophils Absolute: 0.2 10*3/uL (ref 0.0–0.7)
H Pylori IgG: NEGATIVE
HCT: 47.5 % — ABNORMAL HIGH (ref 36.0–46.0)
Lipase: 37 units/L (ref 11.0–59.0)
MCV: 97 fL (ref 78.0–100.0)
Monocytes Absolute: 0.5 10*3/uL (ref 0.1–1.0)
Monocytes Relative: 10.1 % (ref 3.0–12.0)
Neutrophils Relative %: 50.3 % (ref 43.0–77.0)
Platelets: 191 10*3/uL (ref 150–400)
RDW: 12.5 % (ref 11.5–14.6)
WBC: 5.3 10*3/uL (ref 4.5–10.5)

## 2008-02-18 ENCOUNTER — Telehealth: Payer: Self-pay | Admitting: Nurse Practitioner

## 2008-02-18 ENCOUNTER — Telehealth: Payer: Self-pay | Admitting: Family Medicine

## 2008-02-19 ENCOUNTER — Telehealth: Payer: Self-pay | Admitting: Internal Medicine

## 2008-02-24 ENCOUNTER — Telehealth: Payer: Self-pay | Admitting: Family Medicine

## 2008-02-25 ENCOUNTER — Telehealth: Payer: Self-pay | Admitting: Nurse Practitioner

## 2008-02-27 ENCOUNTER — Encounter: Payer: Self-pay | Admitting: Family Medicine

## 2008-03-01 ENCOUNTER — Telehealth (INDEPENDENT_AMBULATORY_CARE_PROVIDER_SITE_OTHER): Payer: Self-pay | Admitting: *Deleted

## 2008-03-04 ENCOUNTER — Telehealth (INDEPENDENT_AMBULATORY_CARE_PROVIDER_SITE_OTHER): Payer: Self-pay | Admitting: *Deleted

## 2008-03-04 ENCOUNTER — Telehealth: Payer: Self-pay | Admitting: Internal Medicine

## 2008-03-10 ENCOUNTER — Telehealth (INDEPENDENT_AMBULATORY_CARE_PROVIDER_SITE_OTHER): Payer: Self-pay | Admitting: *Deleted

## 2008-03-16 ENCOUNTER — Ambulatory Visit: Payer: Self-pay | Admitting: Internal Medicine

## 2008-03-17 ENCOUNTER — Telehealth: Payer: Self-pay | Admitting: Internal Medicine

## 2008-03-23 ENCOUNTER — Encounter: Payer: Self-pay | Admitting: Family Medicine

## 2008-03-24 ENCOUNTER — Ambulatory Visit: Payer: Self-pay | Admitting: Family Medicine

## 2008-03-24 DIAGNOSIS — F341 Dysthymic disorder: Secondary | ICD-10-CM

## 2008-04-05 ENCOUNTER — Telehealth: Payer: Self-pay | Admitting: Family Medicine

## 2008-04-20 ENCOUNTER — Telehealth: Payer: Self-pay | Admitting: Family Medicine

## 2008-04-20 ENCOUNTER — Encounter: Payer: Self-pay | Admitting: Family Medicine

## 2008-05-24 ENCOUNTER — Telehealth (INDEPENDENT_AMBULATORY_CARE_PROVIDER_SITE_OTHER): Payer: Self-pay | Admitting: *Deleted

## 2008-06-07 ENCOUNTER — Telehealth (INDEPENDENT_AMBULATORY_CARE_PROVIDER_SITE_OTHER): Payer: Self-pay | Admitting: *Deleted

## 2008-06-09 ENCOUNTER — Ambulatory Visit: Payer: Self-pay | Admitting: Pulmonary Disease

## 2008-06-11 ENCOUNTER — Encounter: Payer: Self-pay | Admitting: Family Medicine

## 2008-07-06 ENCOUNTER — Telehealth: Payer: Self-pay | Admitting: Pulmonary Disease

## 2008-07-28 ENCOUNTER — Encounter: Admission: RE | Admit: 2008-07-28 | Discharge: 2008-07-28 | Payer: Self-pay | Admitting: Family Medicine

## 2008-08-03 ENCOUNTER — Telehealth: Payer: Self-pay | Admitting: Pulmonary Disease

## 2008-08-25 ENCOUNTER — Telehealth: Payer: Self-pay | Admitting: Internal Medicine

## 2008-09-02 ENCOUNTER — Encounter (INDEPENDENT_AMBULATORY_CARE_PROVIDER_SITE_OTHER): Payer: Self-pay | Admitting: *Deleted

## 2008-09-15 ENCOUNTER — Encounter: Admission: RE | Admit: 2008-09-15 | Discharge: 2008-09-15 | Payer: Self-pay | Admitting: Family Medicine

## 2008-10-04 ENCOUNTER — Telehealth (INDEPENDENT_AMBULATORY_CARE_PROVIDER_SITE_OTHER): Payer: Self-pay | Admitting: *Deleted

## 2008-10-20 ENCOUNTER — Ambulatory Visit (HOSPITAL_COMMUNITY): Admission: RE | Admit: 2008-10-20 | Discharge: 2008-10-21 | Payer: Self-pay | Admitting: General Surgery

## 2008-10-22 ENCOUNTER — Encounter: Payer: Self-pay | Admitting: Family Medicine

## 2008-10-26 ENCOUNTER — Inpatient Hospital Stay (HOSPITAL_COMMUNITY): Admission: EM | Admit: 2008-10-26 | Discharge: 2008-11-02 | Payer: Self-pay | Admitting: Emergency Medicine

## 2008-10-28 ENCOUNTER — Ambulatory Visit: Payer: Self-pay | Admitting: Vascular Surgery

## 2008-10-28 ENCOUNTER — Encounter (INDEPENDENT_AMBULATORY_CARE_PROVIDER_SITE_OTHER): Payer: Self-pay | Admitting: Internal Medicine

## 2008-11-01 ENCOUNTER — Ambulatory Visit: Payer: Self-pay | Admitting: Physical Medicine & Rehabilitation

## 2008-11-18 ENCOUNTER — Ambulatory Visit: Payer: Self-pay | Admitting: Psychiatry

## 2008-11-18 ENCOUNTER — Emergency Department (HOSPITAL_COMMUNITY): Admission: EM | Admit: 2008-11-18 | Discharge: 2008-11-18 | Payer: Self-pay | Admitting: Emergency Medicine

## 2008-11-18 ENCOUNTER — Inpatient Hospital Stay (HOSPITAL_COMMUNITY): Admission: AD | Admit: 2008-11-18 | Discharge: 2008-12-06 | Payer: Self-pay | Admitting: Psychiatry

## 2008-12-02 ENCOUNTER — Ambulatory Visit (HOSPITAL_COMMUNITY): Admission: RE | Admit: 2008-12-02 | Discharge: 2008-12-02 | Payer: Self-pay | Admitting: Psychiatry

## 2009-01-01 ENCOUNTER — Ambulatory Visit: Payer: Self-pay | Admitting: Diagnostic Radiology

## 2009-01-01 ENCOUNTER — Encounter: Payer: Self-pay | Admitting: Emergency Medicine

## 2009-01-01 ENCOUNTER — Inpatient Hospital Stay (HOSPITAL_COMMUNITY): Admission: EM | Admit: 2009-01-01 | Discharge: 2009-01-07 | Payer: Self-pay | Admitting: Internal Medicine

## 2009-02-11 ENCOUNTER — Telehealth (INDEPENDENT_AMBULATORY_CARE_PROVIDER_SITE_OTHER): Payer: Self-pay | Admitting: *Deleted

## 2009-02-17 ENCOUNTER — Encounter: Admission: RE | Admit: 2009-02-17 | Discharge: 2009-02-17 | Payer: Self-pay | Admitting: General Surgery

## 2009-05-02 ENCOUNTER — Telehealth: Payer: Self-pay | Admitting: Pulmonary Disease

## 2009-05-09 ENCOUNTER — Emergency Department (HOSPITAL_COMMUNITY): Admission: EM | Admit: 2009-05-09 | Discharge: 2009-05-09 | Payer: Self-pay | Admitting: Emergency Medicine

## 2009-05-20 ENCOUNTER — Encounter: Admission: RE | Admit: 2009-05-20 | Discharge: 2009-05-20 | Payer: Self-pay | Admitting: Orthopaedic Surgery

## 2009-06-20 ENCOUNTER — Emergency Department (HOSPITAL_COMMUNITY): Admission: EM | Admit: 2009-06-20 | Discharge: 2009-06-20 | Payer: Self-pay | Admitting: Emergency Medicine

## 2009-06-20 ENCOUNTER — Inpatient Hospital Stay (HOSPITAL_COMMUNITY): Admission: EM | Admit: 2009-06-20 | Discharge: 2009-06-27 | Payer: Self-pay | Admitting: Psychiatry

## 2009-06-20 ENCOUNTER — Ambulatory Visit: Payer: Self-pay | Admitting: Psychiatry

## 2009-06-23 ENCOUNTER — Emergency Department (HOSPITAL_COMMUNITY): Admission: EM | Admit: 2009-06-23 | Discharge: 2009-06-23 | Payer: Self-pay | Admitting: Emergency Medicine

## 2009-07-12 ENCOUNTER — Emergency Department (HOSPITAL_COMMUNITY): Admission: EM | Admit: 2009-07-12 | Discharge: 2009-07-12 | Payer: Self-pay | Admitting: Emergency Medicine

## 2009-07-13 ENCOUNTER — Emergency Department (HOSPITAL_COMMUNITY): Admission: EM | Admit: 2009-07-13 | Discharge: 2009-07-13 | Payer: Self-pay | Admitting: Emergency Medicine

## 2009-07-14 ENCOUNTER — Inpatient Hospital Stay (HOSPITAL_COMMUNITY): Admission: EM | Admit: 2009-07-14 | Discharge: 2009-07-18 | Payer: Self-pay | Admitting: Emergency Medicine

## 2009-07-14 ENCOUNTER — Ambulatory Visit: Payer: Self-pay | Admitting: Cardiovascular Disease

## 2009-07-15 ENCOUNTER — Encounter (INDEPENDENT_AMBULATORY_CARE_PROVIDER_SITE_OTHER): Payer: Self-pay | Admitting: Internal Medicine

## 2009-08-19 ENCOUNTER — Emergency Department (HOSPITAL_COMMUNITY): Admission: EM | Admit: 2009-08-19 | Discharge: 2009-08-19 | Payer: Self-pay | Admitting: Emergency Medicine

## 2009-09-13 ENCOUNTER — Emergency Department (HOSPITAL_COMMUNITY): Admission: EM | Admit: 2009-09-13 | Discharge: 2009-09-13 | Payer: Self-pay | Admitting: Emergency Medicine

## 2009-09-28 ENCOUNTER — Emergency Department (HOSPITAL_COMMUNITY): Admission: EM | Admit: 2009-09-28 | Discharge: 2009-09-28 | Payer: Self-pay | Admitting: Emergency Medicine

## 2009-11-07 ENCOUNTER — Emergency Department (HOSPITAL_COMMUNITY): Admission: EM | Admit: 2009-11-07 | Discharge: 2009-11-07 | Payer: Self-pay | Admitting: Emergency Medicine

## 2009-11-12 ENCOUNTER — Inpatient Hospital Stay (HOSPITAL_COMMUNITY)
Admission: EM | Admit: 2009-11-12 | Discharge: 2009-11-17 | Payer: Self-pay | Source: Home / Self Care | Admitting: Emergency Medicine

## 2009-11-12 ENCOUNTER — Emergency Department (HOSPITAL_COMMUNITY): Admission: EM | Admit: 2009-11-12 | Discharge: 2009-11-12 | Payer: Self-pay | Admitting: Emergency Medicine

## 2010-01-22 ENCOUNTER — Encounter: Payer: Self-pay | Admitting: Family Medicine

## 2010-01-22 ENCOUNTER — Encounter: Payer: Self-pay | Admitting: Orthopaedic Surgery

## 2010-02-02 NOTE — Progress Notes (Signed)
Summary: refill tramadol  Phone Note From Pharmacy   Caller: [facility] Call For: Kimberle Stanfill  Summary of Call: refill tramadol 50mg  1-2 tabs by mouth every 6-8 hours as needed for pain Initial call taken by: Alfred Levins, CMA,  April 08, 2007 8:44 AM  Follow-up for Phone Call        call in #100 with 5 rf Follow-up by: Nelwyn Salisbury MD,  April 08, 2007 9:08 AM  Additional Follow-up for Phone Call Additional follow up Details #1::        Phone call completed, Pharmacist called Additional Follow-up by: Alfred Levins, CMA,  April 08, 2007 9:21 AM      Prescriptions: TRAMADOL HCL 50 MG  TABS (TRAMADOL HCL) 1 TAB EVERY 4 HOURS as needed PAIN  #100 x 5   Entered by:   Alfred Levins, CMA   Authorized by:   Nelwyn Salisbury MD   Signed by:   Alfred Levins, CMA on 04/08/2007   Method used:   Telephoned to ...       [facility] [address] [phone]*       [address] [place], [place]  [address]       Ph: [phone]       Fax: [phone]   RxID:   [id number]

## 2010-02-02 NOTE — Progress Notes (Signed)
Summary: Cough syrup refill request  Phone Note Refill Request Message from:  Fax from Pharmacy  Refills Requested: Medication #1:  PROMETHAZINE-CODEINE 6.25-10 MG/5ML SYRP 1 tsp by mouth two times a day as needed [facility] [address] phone [phone] fax [phone] last filled 09-02-2008, pt last seen June 2010, please advise  Initial call taken by: Zackery Barefoot CMA,  May 02, 2009 11:59 AM  Follow-up for Phone Call        arrange ov with TP. ok x1 refill Follow-up by: Comer Locket. Vassie Loll MD,  May 02, 2009 12:33 PM  Additional Follow-up for Phone Call Additional follow up Details #1::        pt scheduled. rx called to pharmacy. Zackery Barefoot CMA  May 02, 2009 4:13 PM     Prescriptions: PROMETHAZINE-CODEINE 6.25-10 MG/5ML SYRP (PROMETHAZINE-CODEINE) 1 tsp by mouth two times a day as needed  #125mL x 0   Entered by:   Zackery Barefoot CMA   Authorized by:   Comer Locket. Vassie Loll MD   Signed by:   Zackery Barefoot CMA on 05/02/2009   Method used:   Telephoned to ...       [facility] [address] (retail)       [address]       [place], [place]  [address]       Ph: [phone] or [phone]       Fax: [phone]   RxID:   [id number]

## 2010-02-02 NOTE — Progress Notes (Signed)
Summary: REFILL  Phone Note Refill Request Message from:  Fax from Pharmacy on February 11, 2009 4:23 PM  Refills Requested: Medication #1:  METOPROLOL SUCCINATE 50 MG  TB24 1/2 by mouth once daily [facility] [address] FAX [phone]   Method Requested: Fax to Local Pharmacy Initial call taken by: Barb Merino,  February 11, 2009 4:24 PM  Follow-up for Phone Call        Pt no longer a pt here.faxed to pharm. Army Fossa CMA  February 11, 2009 4:26 PM

## 2010-02-27 ENCOUNTER — Emergency Department (HOSPITAL_COMMUNITY): Payer: Medicare Other

## 2010-02-27 ENCOUNTER — Inpatient Hospital Stay (HOSPITAL_COMMUNITY)
Admission: EM | Admit: 2010-02-27 | Discharge: 2010-03-01 | DRG: 641 | Disposition: A | Discharge status: Home Health | Payer: Medicare Other | Attending: Internal Medicine | Admitting: Internal Medicine

## 2010-02-27 DIAGNOSIS — Z794 Long term (current) use of insulin: Secondary | ICD-10-CM

## 2010-02-27 DIAGNOSIS — E119 Type 2 diabetes mellitus without complications: Secondary | ICD-10-CM | POA: Diagnosis present

## 2010-02-27 DIAGNOSIS — R32 Unspecified urinary incontinence: Secondary | ICD-10-CM | POA: Diagnosis present

## 2010-02-27 DIAGNOSIS — Z9181 History of falling: Secondary | ICD-10-CM

## 2010-02-27 DIAGNOSIS — F329 Major depressive disorder, single episode, unspecified: Secondary | ICD-10-CM | POA: Diagnosis present

## 2010-02-27 DIAGNOSIS — E860 Dehydration: Principal | ICD-10-CM | POA: Diagnosis present

## 2010-02-27 DIAGNOSIS — Z7982 Long term (current) use of aspirin: Secondary | ICD-10-CM

## 2010-02-27 DIAGNOSIS — K5730 Diverticulosis of large intestine without perforation or abscess without bleeding: Secondary | ICD-10-CM | POA: Diagnosis present

## 2010-02-27 DIAGNOSIS — F3289 Other specified depressive episodes: Secondary | ICD-10-CM | POA: Diagnosis present

## 2010-02-27 DIAGNOSIS — G609 Hereditary and idiopathic neuropathy, unspecified: Secondary | ICD-10-CM | POA: Diagnosis present

## 2010-02-27 DIAGNOSIS — M47817 Spondylosis without myelopathy or radiculopathy, lumbosacral region: Secondary | ICD-10-CM | POA: Diagnosis present

## 2010-02-27 DIAGNOSIS — R5381 Other malaise: Secondary | ICD-10-CM | POA: Diagnosis present

## 2010-02-27 DIAGNOSIS — I10 Essential (primary) hypertension: Secondary | ICD-10-CM | POA: Diagnosis present

## 2010-02-27 LAB — CK TOTAL AND CKMB (NOT AT ARMC): Relative Index: INVALID (ref 0.0–2.5)

## 2010-02-27 LAB — POCT I-STAT, CHEM 8
Glucose, Bld: 277 mg/dL — ABNORMAL HIGH (ref 70–99)
HCT: 40 % (ref 36.0–46.0)
Hemoglobin: 13.6 g/dL (ref 12.0–15.0)
Potassium: 4.4 mEq/L (ref 3.5–5.1)
Sodium: 134 mEq/L — ABNORMAL LOW (ref 135–145)

## 2010-02-27 LAB — DIFFERENTIAL
Basophils Absolute: 0 10*3/uL (ref 0.0–0.1)
Basophils Relative: 0 % (ref 0–1)
Eosinophils Relative: 3 % (ref 0–5)
Monocytes Absolute: 0.7 10*3/uL (ref 0.1–1.0)

## 2010-02-27 LAB — URINALYSIS, ROUTINE W REFLEX MICROSCOPIC
Hgb urine dipstick: NEGATIVE
Nitrite: NEGATIVE
Specific Gravity, Urine: 1.032 — ABNORMAL HIGH (ref 1.005–1.030)
Urobilinogen, UA: 0.2 mg/dL (ref 0.0–1.0)
pH: 5 (ref 5.0–8.0)

## 2010-02-27 LAB — CBC
MCHC: 36.2 g/dL — ABNORMAL HIGH (ref 30.0–36.0)
Platelets: 215 10*3/uL (ref 150–400)
RDW: 14.2 % (ref 11.5–15.5)
WBC: 6.9 10*3/uL (ref 4.0–10.5)

## 2010-02-27 LAB — POCT CARDIAC MARKERS
CKMB, poc: <1 ng/mL — ABNORMAL LOW (ref 1.0–8.0)
Myoglobin, poc: 80.6 ng/mL (ref 12–200)

## 2010-02-27 LAB — URINE MICROSCOPIC-ADD ON

## 2010-02-27 LAB — TROPONIN I: Troponin I: <0.01 ng/mL (ref 0.00–0.06)

## 2010-02-27 LAB — GLUCOSE, CAPILLARY: Glucose-Capillary: 275 mg/dL — ABNORMAL HIGH (ref 70–99)

## 2010-02-27 NOTE — H&P (Signed)
NAME:  NIKKY, DUBA NO.:  [id number]  MEDICAL RECORD NO.:  [id number]           PATIENT TYPE:  E  LOCATION:  [facility]                         FACILITY:  [facility]  PHYSICIAN:  Talmage Nap, MD  DATE OF BIRTH:  09-Oct-1940  DATE OF ADMISSION:  02/27/2010 DATE OF DISCHARGE:                             HISTORY & PHYSICAL   PRIMARY CARE PHYSICIAN:  Stacie Acres. White, MD  History obtainable from the patient and family members.  CHIEF COMPLAINT:  Multiple episodes of fall at home.  The patient is a 70 year old Caucasian female with history of diabetes mellitus and severe peripheral neuropathy, presenting to the emergency room with multiple episodes of falls which is said to be getting progressively worse.  The patient initially was said to have been situated in [place] and was able to carry out her activities of daily living with aid.  However, after relocating to [facility] in the past 3 weeks, the patient was said to be getting progressively less active in her activities of daily living.  She was said to have had multiple episodes of falls with no premonitory symptoms.  The patient had 2 falls yesterday and 1 fall today.  She denied any premonitory symptoms prior to the onset of the fall.  She denied any history of chest pain.  She denied any history of shortness of breath.  No dizzy spell and prior to now, the patient has had a total of about 8 falls.  She is also said to be getting less active in her activities of daily living and to be incontinent of urine.  No systemic symptoms i.e. no fever, no chills, no rigor.  The patient was however brought to the hospital for help from the social worker.  PAST MEDICAL HISTORY:  Positive for: 1. Diabetes mellitus. 2. Hypertension. 3. Peripheral neuropathy. 4. Depression. 5. History of diverticulosis. 6. Peripheral neuropathy.  PAST SURGICAL HISTORY: 1. Lower back surgery. 2. Bilateral carpal tunnel surgery.  Her  preadmission meds include: 1. Insulin, dose unknown. 2. Metoprolol, dose unknown. 3. Tramadol/acetaminophen, dose unknown. 4. Vicodin dose unknown.  ALLERGIES: 1. ACCOLATE. 2. ATIVAN. 3. AVANDIA. 4. CODEINE. 5. FLEXERIL. 6. GLUCOPHAGE. 7. LEVAQUIN. 8. METFORMIN. 9. NEURONTIN. 10.PHENERGAN. 11.VERAPAMIL.  SOCIAL HISTORY:  Negative for alcohol or tobacco use.  FAMILY HISTORY:  York Spaniel to be positive for diabetes mellitus.  REVIEW OF SYSTEMS:  The patient denies any history of headaches.  No blurred vision.  No nausea or vomiting.  No fever.  No chills.  No rigor.  No chest pain or shortness of breath.  No cough.  No abdominal discomfort.  No diarrhea or hematochezia.  Occasionally, incontinent of urine.  No dysuria or hematuria.  No swelling of the lower extremities. No intolerance to heat or cold.  No known psychiatric disorder.  PHYSICAL EXAMINATION:  GENERAL:  Elderly lady, dehydrated, looking very unkempt, not in any obvious respiratory distress. VITAL SIGNS:  Blood pressure is 126/57, pulse of 79, respiratory rate is 18, temperature is 97.4. HEENT: Mild pallor.  Both pupils are reactive to light and extraocular muscles are intact. NECK:  No jugular venous distention.  No carotid bruit.  No lymphadenopathy. CHEST:  Clear to auscultation. HEART:  Sounds are 1 and 2. ABDOMEN:  Soft, nontender.  Liver, spleen, kidney not palpable.  Bowel sounds are positive. EXTREMITIES:  No pedal edema. NEUROLOGIC:  Nonfocal. MUSCULOSKELETAL:  Arthritic changes in the knees and in the feet. NEUROPSYCHIATRIC:  Unremarkable. SKIN:  Decreased turgor.  LABORATORY DATA:  Initial data, first set of cardiac markers troponin-I less than 0.05.  Chem-8 showed sodium of 134, potassium of 4.4, chloride of 106, BUN is 20, creatinine is 0.7, glucose is 277.  Hematological indices showed WBC of 6.9, hemoglobin of 14.4, hematocrit of 39.8, MCV of 89.6 with a platelet count of 215 with normal  differential.  Imaging studies done include chest x-ray which showed no acute cardiopulmonary findings and EKG showed normal sinus rhythm with a rate of 76.  No acute ST-wave change noted.  ADMITTING IMPRESSION: 1. Multiple falls. 2. Dehydration. 3. Diabetes mellitus. 4. Peripheral neuropathy. 5. Urinary incontinence. 6. Hypertension. 7. Depression. 8. Deconditioning.  PLAN:  To admit the patient to general medical floor.  The patient will be adequately rehydrated with normal saline IV to go at a rate of  75mL an hour.  She will be given aspirin 81 mg p.o. daily, Percocet 5/325 1-2 tablets p.o. q.6 p.r.n. for pain, and then Lyrica 75 mg p.o. b.i.d. for her peripheral neuropathy.  The patient will be on Accu-Cheks t.i.d. with a.c. at bedtime with regular insulin sliding scale (moderate scale).  She will be on Protonix 40 mg IV q.24 for GI prophylaxis and then Lovenox 40 mg subcu q.24 for DVT prophylaxis.  Other labs to be ordered on the patient will include cardiac enzymes q.6 x3, hemoglobin A1c, CBC, CMP, and magnesium will be repeated in a.m. and the patient will have an x-ray of the lumbosacral spine done.  Finally, Physical Therapy will be consulted for gradual ambulation and gait exercises and a social worker will be consulted for evaluation of the patient for home aide.  The patient will be followed and evaluated on a daily basis.     Talmage Nap, MD     CN/MEDQ  D:  02/27/2010  T:  02/27/2010  Job:  [id number]  Electronically Signed by Talmage Nap  on 02/27/2010 06:34:33 PM

## 2010-02-28 ENCOUNTER — Inpatient Hospital Stay (HOSPITAL_COMMUNITY): Payer: Medicare Other

## 2010-02-28 LAB — COMPREHENSIVE METABOLIC PANEL
ALT: 36 U/L — ABNORMAL HIGH (ref 0–35)
AST: 30 U/L (ref 0–37)
Albumin: 3.4 g/dL — ABNORMAL LOW (ref 3.5–5.2)
Alkaline Phosphatase: 123 U/L — ABNORMAL HIGH (ref 39–117)
CO2: 30 mEq/L (ref 19–32)
Chloride: 103 mEq/L (ref 96–112)
Creatinine, Ser: 0.71 mg/dL (ref 0.4–1.2)
GFR calc Af Amer: >60 mL/min (ref >60)
GFR calc non Af Amer: >60 mL/min (ref >60)
Potassium: 4.4 mEq/L (ref 3.5–5.1)
Sodium: 139 mEq/L (ref 135–145)
Total Bilirubin: 0.7 mg/dL (ref 0.3–1.2)

## 2010-02-28 LAB — GLUCOSE, CAPILLARY
Glucose-Capillary: 252 mg/dL — ABNORMAL HIGH (ref 70–99)
Glucose-Capillary: 251 mg/dL — ABNORMAL HIGH (ref 70–99)
Glucose-Capillary: 336 mg/dL — ABNORMAL HIGH (ref 70–99)

## 2010-02-28 LAB — DIFFERENTIAL
Basophils Relative: 0 % (ref 0–1)
Eosinophils Absolute: 0.1 10*3/uL (ref 0.0–0.7)
Monocytes Relative: 9 % (ref 3–12)
Neutro Abs: 2.8 10*3/uL (ref 1.7–7.7)
Neutrophils Relative %: 53 % (ref 43–77)

## 2010-02-28 LAB — HEMOGLOBIN A1C: Mean Plasma Glucose: 166 mg/dL — ABNORMAL HIGH (ref <117)

## 2010-02-28 LAB — CARDIAC PANEL(CRET KIN+CKTOT+MB+TROPI)
CK, MB: 1.9 ng/mL (ref 0.3–4.0)
Relative Index: INVALID (ref 0.0–2.5)
Relative Index: INVALID (ref 0.0–2.5)
Troponin I: 0.01 ng/mL (ref 0.00–0.06)
Troponin I: 0.02 ng/mL (ref 0.00–0.06)

## 2010-02-28 LAB — CBC
Hemoglobin: 13.4 g/dL (ref 12.0–15.0)
Platelets: 192 10*3/uL (ref 150–400)
RBC: 4.19 MIL/uL (ref 3.87–5.11)
WBC: 5.3 10*3/uL (ref 4.0–10.5)

## 2010-03-01 LAB — GLUCOSE, CAPILLARY: Glucose-Capillary: 259 mg/dL — ABNORMAL HIGH (ref 70–99)

## 2010-03-12 NOTE — Discharge Summary (Signed)
  NAME:  Katherine Carr, Katherine Carr            ACCOUNT NO.:  [id number]  MEDICAL RECORD NO.:  [id number]           PATIENT TYPE:  I  LOCATION:  5154                         FACILITY:  [facility]  PHYSICIAN:  Lonia Blood, M.D.       DATE OF BIRTH:  Sep 15, 1940  DATE OF ADMISSION:  02/27/2010 DATE OF DISCHARGE:  03/01/2010                              DISCHARGE SUMMARY   PRIMARY CARE PHYSICIAN:  Stacie Acres. White, MD.  DISCHARGE DIAGNOSES: 1. Severe weakness and deconditioning with multiple falls - the     patient was felt to be unsafe to discharge home, but she refused     any skilled nursing home placement and adamantly requested to be     sent home. 2. Diabetes mellitus type 2. 3. Hypertension. 4. Peripheral neuropathy. 5. Depression. 6. Diverticulosis. 7. History of back surgery.  DISCHARGE MEDICATIONS: 1. Humulin 500 units per mL.  The patient to take 0.6 mL three times a     day before meals. 2. Alprazolam 0.5 mg by mouth every hours as needed for anxiety. 3. Estradiol 0.5 mg daily. 4. Hydrocodone/APAP 5/325 a tablet every 48 hours as needed. 5. Klonopin 0.5 mg twice a day. 6. Lexapro 20 mg daily. 7. Lidoderm patch topical daily. 8. Nexium 40 mg daily. 9. Toprol-XL 25 mg daily.  CONDITION ON DISCHARGE:  [name] was discharged home with home health, PT/OT, registered nurse, and social worker.  She was told that we think she needs skilled nursing home placement.  She refused this offer.  She says she gets meals on wheels and her friend checks on her.  PROCEDURE THIS ADMISSION:  The patient underwent a x-ray of the lumbar spine, which was positive for spondylosis, but no fractures.  CONSULTATION ON ADMISSION:  The patient was seen by physical therapist and occupation therapist and they both recommended skilled nursing home placement.  HISTORY AND PHYSICAL:  Refer to the H and P done by Dr. Beverly Gust.  HOSPITAL COURSE:  [name] is a 70 year old woman with  multiple medical problems, presented from home with multiple falls.  She did not have any acute problems on top of her chronic illnesses.  She received some intravenous fluids for a day.  She was offered Lyrica for neuropathy, but she promptly refused.  She was then resumed on her home medications and was evaluated to baseline.  The patient is found to be extremely weak with difficulties moving around.  She was offered skilled nursing home placement, but she refused and elected to go home with a wheelchair and transported by her friend.  She says she has meals on wheels and she could manage on her own at home.     Lonia Blood, M.D.     SL/MEDQ  D:  03/03/2010  T:  03/03/2010  Job:  [id number]  cc:   Stacie Acres. Cliffton Asters, M.D.  Electronically Signed by Lonia Blood M.D. on 03/12/2010 08:53:57 AM

## 2010-03-14 LAB — BASIC METABOLIC PANEL
CO2: 29 mEq/L (ref 19–32)
CO2: 31 mEq/L (ref 19–32)
Calcium: 8.8 mg/dL (ref 8.4–10.5)
Calcium: 8.9 mg/dL (ref 8.4–10.5)
Calcium: 9.3 mg/dL (ref 8.4–10.5)
GFR calc Af Amer: >60 mL/min (ref >60)
GFR calc Af Amer: >60 mL/min (ref >60)
GFR calc non Af Amer: >60 mL/min (ref >60)
GFR calc non Af Amer: >60 mL/min (ref >60)
Glucose, Bld: 251 mg/dL — ABNORMAL HIGH (ref 70–99)
Glucose, Bld: 405 mg/dL — ABNORMAL HIGH (ref 70–99)
Potassium: 3.7 mEq/L (ref 3.5–5.1)
Potassium: 4.1 mEq/L (ref 3.5–5.1)
Sodium: 135 mEq/L (ref 135–145)
Sodium: 138 mEq/L (ref 135–145)
Sodium: 139 mEq/L (ref 135–145)

## 2010-03-14 LAB — URINE MICROSCOPIC-ADD ON

## 2010-03-14 LAB — DIFFERENTIAL
Basophils Relative: 0 % (ref 0–1)
Lymphs Abs: 1.1 10*3/uL (ref 0.7–4.0)
Monocytes Absolute: 0.4 10*3/uL (ref 0.1–1.0)
Monocytes Relative: 8 % (ref 3–12)
Neutro Abs: 4 10*3/uL (ref 1.7–7.7)
Neutrophils Relative %: 71 % (ref 43–77)

## 2010-03-14 LAB — GLUCOSE, CAPILLARY
Glucose-Capillary: 203 mg/dL — ABNORMAL HIGH (ref 70–99)
Glucose-Capillary: 246 mg/dL — ABNORMAL HIGH (ref 70–99)
Glucose-Capillary: 294 mg/dL — ABNORMAL HIGH (ref 70–99)
Glucose-Capillary: 294 mg/dL — ABNORMAL HIGH (ref 70–99)
Glucose-Capillary: 298 mg/dL — ABNORMAL HIGH (ref 70–99)
Glucose-Capillary: 302 mg/dL — ABNORMAL HIGH (ref 70–99)
Glucose-Capillary: 303 mg/dL — ABNORMAL HIGH (ref 70–99)
Glucose-Capillary: 312 mg/dL — ABNORMAL HIGH (ref 70–99)
Glucose-Capillary: 327 mg/dL — ABNORMAL HIGH (ref 70–99)
Glucose-Capillary: 362 mg/dL — ABNORMAL HIGH (ref 70–99)
Glucose-Capillary: 381 mg/dL — ABNORMAL HIGH (ref 70–99)
Glucose-Capillary: 408 mg/dL — ABNORMAL HIGH (ref 70–99)
Glucose-Capillary: 293 mg/dL — ABNORMAL HIGH (ref 70–99)

## 2010-03-14 LAB — URINALYSIS, ROUTINE W REFLEX MICROSCOPIC
Glucose, UA: >1000 mg/dL — AB
Leukocytes, UA: NEGATIVE
Protein, ur: NEGATIVE mg/dL
Specific Gravity, Urine: 1.034 — ABNORMAL HIGH (ref 1.005–1.030)
Urobilinogen, UA: 0.2 mg/dL (ref 0.0–1.0)

## 2010-03-14 LAB — RAPID URINE DRUG SCREEN, HOSP PERFORMED
Amphetamines: NOT DETECTED
Barbiturates: NOT DETECTED
Opiates: POSITIVE — AB

## 2010-03-14 LAB — CBC
HCT: 42.9 % (ref 36.0–46.0)
Hemoglobin: 15.4 g/dL — ABNORMAL HIGH (ref 12.0–15.0)
MCHC: 35.9 g/dL (ref 30.0–36.0)
MCV: 92.9 fL (ref 78.0–100.0)
Platelets: 173 10*3/uL (ref 150–400)
RBC: 4.67 MIL/uL (ref 3.87–5.11)
RDW: 14.2 % (ref 11.5–15.5)
WBC: 4.1 10*3/uL (ref 4.0–10.5)

## 2010-03-14 LAB — COMPREHENSIVE METABOLIC PANEL
ALT: 47 U/L — ABNORMAL HIGH (ref 0–35)
AST: 31 U/L (ref 0–37)
Albumin: 3.5 g/dL (ref 3.5–5.2)
Alkaline Phosphatase: 158 U/L — ABNORMAL HIGH (ref 39–117)
CO2: 31 mEq/L (ref 19–32)
Chloride: 102 mEq/L (ref 96–112)
Creatinine, Ser: 0.55 mg/dL (ref 0.4–1.2)
GFR calc Af Amer: >60 mL/min (ref >60)
GFR calc non Af Amer: >60 mL/min (ref >60)
Potassium: 3.6 mEq/L (ref 3.5–5.1)
Sodium: 141 mEq/L (ref 135–145)
Total Bilirubin: 0.7 mg/dL (ref 0.3–1.2)

## 2010-03-14 LAB — URINE CULTURE: Colony Count: 75000

## 2010-03-14 LAB — LIPID PANEL
Cholesterol: 176 mg/dL (ref 0–200)
LDL Cholesterol: 79 mg/dL (ref 0–99)
Total CHOL/HDL Ratio: 3.5 RATIO
Triglycerides: 233 mg/dL — ABNORMAL HIGH (ref <150)
VLDL: 47 mg/dL — ABNORMAL HIGH (ref 0–40)

## 2010-03-14 LAB — CK TOTAL AND CKMB (NOT AT ARMC)
CK, MB: 1.8 ng/mL (ref 0.3–4.0)
Relative Index: INVALID (ref 0.0–2.5)
Total CK: 44 U/L (ref 7–177)

## 2010-03-14 LAB — VITAMIN B12: Vitamin B-12: 617 pg/mL (ref 211–911)

## 2010-03-14 LAB — CARDIAC PANEL(CRET KIN+CKTOT+MB+TROPI)
CK, MB: 1.5 ng/mL (ref 0.3–4.0)
Total CK: 42 U/L (ref 7–177)
Troponin I: 0.02 ng/mL (ref 0.00–0.06)

## 2010-03-14 LAB — TROPONIN I: Troponin I: 0.02 ng/mL (ref 0.00–0.06)

## 2010-03-14 LAB — MAGNESIUM: Magnesium: 1.9 mg/dL (ref 1.5–2.5)

## 2010-03-16 LAB — BASIC METABOLIC PANEL
BUN: 15 mg/dL (ref 6–23)
CO2: 29 mEq/L (ref 19–32)
Glucose, Bld: 487 mg/dL — ABNORMAL HIGH (ref 70–99)
Potassium: 4.5 mEq/L (ref 3.5–5.1)
Sodium: 134 mEq/L — ABNORMAL LOW (ref 135–145)

## 2010-03-16 LAB — CBC
Hemoglobin: 15.1 g/dL — ABNORMAL HIGH (ref 12.0–15.0)
MCH: 32.7 pg (ref 26.0–34.0)
RBC: 4.62 MIL/uL (ref 3.87–5.11)

## 2010-03-16 LAB — DIFFERENTIAL
Lymphs Abs: 1.5 10*3/uL (ref 0.7–4.0)
Monocytes Relative: 8 % (ref 3–12)
Neutro Abs: 4.7 10*3/uL (ref 1.7–7.7)
Neutrophils Relative %: 67 % (ref 43–77)

## 2010-03-16 LAB — POCT I-STAT, CHEM 8
BUN: 22 mg/dL (ref 6–23)
Chloride: 105 mEq/L (ref 96–112)
Creatinine, Ser: 0.6 mg/dL (ref 0.4–1.2)
Potassium: 3.8 mEq/L (ref 3.5–5.1)
Sodium: 139 mEq/L (ref 135–145)

## 2010-03-16 LAB — URINALYSIS, ROUTINE W REFLEX MICROSCOPIC
Glucose, UA: >1000 mg/dL — AB
Hgb urine dipstick: NEGATIVE
Specific Gravity, Urine: 1.037 — ABNORMAL HIGH (ref 1.005–1.030)

## 2010-03-16 LAB — CK TOTAL AND CKMB (NOT AT ARMC)
CK, MB: 1.8 ng/mL (ref 0.3–4.0)
Total CK: 31 U/L (ref 7–177)

## 2010-03-16 LAB — URINE MICROSCOPIC-ADD ON

## 2010-03-16 LAB — GLUCOSE, CAPILLARY
Glucose-Capillary: 279 mg/dL — ABNORMAL HIGH (ref 70–99)
Glucose-Capillary: 346 mg/dL — ABNORMAL HIGH (ref 70–99)

## 2010-03-18 LAB — GLUCOSE, CAPILLARY
Glucose-Capillary: 298 mg/dL — ABNORMAL HIGH (ref 70–99)
Glucose-Capillary: 298 mg/dL — ABNORMAL HIGH (ref 70–99)
Glucose-Capillary: 302 mg/dL — ABNORMAL HIGH (ref 70–99)
Glucose-Capillary: 307 mg/dL — ABNORMAL HIGH (ref 70–99)
Glucose-Capillary: 356 mg/dL — ABNORMAL HIGH (ref 70–99)
Glucose-Capillary: 363 mg/dL — ABNORMAL HIGH (ref 70–99)
Glucose-Capillary: 388 mg/dL — ABNORMAL HIGH (ref 70–99)

## 2010-03-18 LAB — LIPID PANEL
LDL Cholesterol: 45 mg/dL (ref 0–99)
Total CHOL/HDL Ratio: 3.7 RATIO
VLDL: 50 mg/dL — ABNORMAL HIGH (ref 0–40)

## 2010-03-18 LAB — CBC
HCT: 34.6 % — ABNORMAL LOW (ref 36.0–46.0)
Hemoglobin: 12.3 g/dL (ref 12.0–15.0)
MCH: 33.4 pg (ref 26.0–34.0)
MCV: 94.8 fL (ref 78.0–100.0)
MCV: 95.1 fL (ref 78.0–100.0)
Platelets: 167 10*3/uL (ref 150–400)
RBC: 3.64 MIL/uL — ABNORMAL LOW (ref 3.87–5.11)
RDW: 14.7 % (ref 11.5–15.5)
WBC: 3.7 10*3/uL — ABNORMAL LOW (ref 4.0–10.5)
WBC: 3.9 10*3/uL — ABNORMAL LOW (ref 4.0–10.5)

## 2010-03-18 LAB — BASIC METABOLIC PANEL
BUN: 13 mg/dL (ref 6–23)
CO2: 32 mEq/L (ref 19–32)
Chloride: 99 mEq/L (ref 96–112)
Creatinine, Ser: 0.69 mg/dL (ref 0.4–1.2)
GFR calc Af Amer: >60 mL/min (ref >60)
Potassium: 4.2 mEq/L (ref 3.5–5.1)
Sodium: 138 mEq/L (ref 135–145)

## 2010-03-18 LAB — COMPREHENSIVE METABOLIC PANEL
ALT: 40 U/L — ABNORMAL HIGH (ref 0–35)
Alkaline Phosphatase: 141 U/L — ABNORMAL HIGH (ref 39–117)
CO2: 29 mEq/L (ref 19–32)
Chloride: 105 mEq/L (ref 96–112)
Glucose, Bld: 318 mg/dL — ABNORMAL HIGH (ref 70–99)
Potassium: 4.2 mEq/L (ref 3.5–5.1)
Sodium: 137 mEq/L (ref 135–145)
Total Bilirubin: 0.4 mg/dL (ref 0.3–1.2)
Total Protein: 5.3 g/dL — ABNORMAL LOW (ref 6.0–8.3)

## 2010-03-18 LAB — HEMOGLOBIN A1C: Hgb A1c MFr Bld: 7.7 % — ABNORMAL HIGH (ref <5.7)

## 2010-03-18 LAB — CARDIAC PANEL(CRET KIN+CKTOT+MB+TROPI): Troponin I: 0.02 ng/mL (ref 0.00–0.06)

## 2010-03-19 LAB — BASIC METABOLIC PANEL
CO2: 28 mEq/L (ref 19–32)
Chloride: 98 mEq/L (ref 96–112)
Chloride: 100 mEq/L (ref 96–112)
Chloride: 104 mEq/L (ref 96–112)
Chloride: 100 mEq/L (ref 96–112)
Creatinine, Ser: 0.69 mg/dL (ref 0.4–1.2)
Creatinine, Ser: 0.64 mg/dL (ref 0.4–1.2)
GFR calc Af Amer: >60 mL/min (ref >60)
GFR calc Af Amer: >60 mL/min (ref >60)
GFR calc Af Amer: >60 mL/min (ref >60)
GFR calc non Af Amer: >60 mL/min (ref >60)
GFR calc non Af Amer: >60 mL/min (ref >60)
GFR calc non Af Amer: >60 mL/min (ref >60)
Glucose, Bld: 192 mg/dL — ABNORMAL HIGH (ref 70–99)
Glucose, Bld: 294 mg/dL — ABNORMAL HIGH (ref 70–99)
Potassium: 4.1 mEq/L (ref 3.5–5.1)
Potassium: 4.1 mEq/L (ref 3.5–5.1)
Potassium: 4.3 mEq/L (ref 3.5–5.1)
Potassium: 4.3 mEq/L (ref 3.5–5.1)
Sodium: 135 mEq/L (ref 135–145)
Sodium: 139 mEq/L (ref 135–145)

## 2010-03-19 LAB — CBC
HCT: 37.5 % (ref 36.0–46.0)
HCT: 32.5 % — ABNORMAL LOW (ref 36.0–46.0)
HCT: 43.8 % (ref 36.0–46.0)
Hemoglobin: 13.4 g/dL (ref 12.0–15.0)
Hemoglobin: 15.4 g/dL — ABNORMAL HIGH (ref 12.0–15.0)
Hemoglobin: 14.3 g/dL (ref 12.0–15.0)
MCH: 32.4 pg (ref 26.0–34.0)
MCV: 94.3 fL (ref 78.0–100.0)
MCV: 96.7 fL (ref 78.0–100.0)
MCV: 94.9 fL (ref 78.0–100.0)
MCV: 96.3 fL (ref 78.0–100.0)
Platelets: 205 10*3/uL (ref 150–400)
Platelets: 136 10*3/uL — ABNORMAL LOW (ref 150–400)
Platelets: 194 10*3/uL (ref 150–400)
Platelets: 206 10*3/uL (ref 150–400)
RBC: 4.57 MIL/uL (ref 3.87–5.11)
RBC: 4.71 MIL/uL (ref 3.87–5.11)
RBC: 4.42 MIL/uL (ref 3.87–5.11)
RBC: 4.55 MIL/uL (ref 3.87–5.11)
RDW: 15.1 % (ref 11.5–15.5)
RDW: 15.4 % (ref 11.5–15.5)
RDW: 14.2 % (ref 11.5–15.5)
RDW: 13.2 % (ref 11.5–15.5)
WBC: 3.8 10*3/uL — ABNORMAL LOW (ref 4.0–10.5)
WBC: 9 10*3/uL (ref 4.0–10.5)
WBC: 6.5 10*3/uL (ref 4.0–10.5)
WBC: 7.8 10*3/uL (ref 4.0–10.5)

## 2010-03-19 LAB — URINALYSIS, ROUTINE W REFLEX MICROSCOPIC
Glucose, UA: 250 mg/dL — AB
Glucose, UA: >1000 mg/dL — AB
Hgb urine dipstick: NEGATIVE
Hgb urine dipstick: NEGATIVE
Hgb urine dipstick: NEGATIVE
Ketones, ur: NEGATIVE mg/dL
Leukocytes, UA: NEGATIVE
Nitrite: NEGATIVE
Nitrite: NEGATIVE
Nitrite: NEGATIVE
Protein, ur: NEGATIVE mg/dL
Protein, ur: NEGATIVE mg/dL
Protein, ur: NEGATIVE mg/dL
Protein, ur: NEGATIVE mg/dL
Protein, ur: NEGATIVE mg/dL
Specific Gravity, Urine: 1.038 — ABNORMAL HIGH (ref 1.005–1.030)
Specific Gravity, Urine: 1.026 (ref 1.005–1.030)
Urobilinogen, UA: 1 mg/dL (ref 0.0–1.0)
Urobilinogen, UA: 1 mg/dL (ref 0.0–1.0)
Urobilinogen, UA: 1 mg/dL (ref 0.0–1.0)
pH: 5 (ref 5.0–8.0)
pH: 5 (ref 5.0–8.0)

## 2010-03-19 LAB — CK TOTAL AND CKMB (NOT AT ARMC): Relative Index: 1.1 (ref 0.0–2.5)

## 2010-03-19 LAB — COMPREHENSIVE METABOLIC PANEL
ALT: 50 U/L — ABNORMAL HIGH (ref 0–35)
ALT: 53 U/L — ABNORMAL HIGH (ref 0–35)
ALT: 57 U/L — ABNORMAL HIGH (ref 0–35)
AST: 41 U/L — ABNORMAL HIGH (ref 0–37)
AST: 45 U/L — ABNORMAL HIGH (ref 0–37)
AST: 41 U/L — ABNORMAL HIGH (ref 0–37)
Albumin: 3.3 g/dL — ABNORMAL LOW (ref 3.5–5.2)
Albumin: 3.4 g/dL — ABNORMAL LOW (ref 3.5–5.2)
Alkaline Phosphatase: 179 U/L — ABNORMAL HIGH (ref 39–117)
Alkaline Phosphatase: 256 U/L — ABNORMAL HIGH (ref 39–117)
Alkaline Phosphatase: 255 U/L — ABNORMAL HIGH (ref 39–117)
BUN: 18 mg/dL (ref 6–23)
CO2: 29 mEq/L (ref 19–32)
Chloride: 104 mEq/L (ref 96–112)
Chloride: 100 mEq/L (ref 96–112)
GFR calc Af Amer: >60 mL/min (ref >60)
GFR calc Af Amer: >60 mL/min (ref >60)
GFR calc Af Amer: >60 mL/min (ref >60)
GFR calc non Af Amer: >60 mL/min (ref >60)
Glucose, Bld: 262 mg/dL — ABNORMAL HIGH (ref 70–99)
Potassium: 3.9 mEq/L (ref 3.5–5.1)
Potassium: 4.4 mEq/L (ref 3.5–5.1)
Potassium: 4.3 mEq/L (ref 3.5–5.1)
Sodium: 138 mEq/L (ref 135–145)
Sodium: 139 mEq/L (ref 135–145)
Sodium: 136 mEq/L (ref 135–145)
Total Bilirubin: 0.8 mg/dL (ref 0.3–1.2)
Total Bilirubin: 0.9 mg/dL (ref 0.3–1.2)
Total Protein: 7.5 g/dL (ref 6.0–8.3)

## 2010-03-19 LAB — GLUCOSE, CAPILLARY
Glucose-Capillary: 194 mg/dL — ABNORMAL HIGH (ref 70–99)
Glucose-Capillary: 246 mg/dL — ABNORMAL HIGH (ref 70–99)
Glucose-Capillary: 154 mg/dL — ABNORMAL HIGH (ref 70–99)
Glucose-Capillary: 189 mg/dL — ABNORMAL HIGH (ref 70–99)
Glucose-Capillary: 204 mg/dL — ABNORMAL HIGH (ref 70–99)
Glucose-Capillary: 212 mg/dL — ABNORMAL HIGH (ref 70–99)
Glucose-Capillary: 238 mg/dL — ABNORMAL HIGH (ref 70–99)
Glucose-Capillary: 262 mg/dL — ABNORMAL HIGH (ref 70–99)
Glucose-Capillary: 287 mg/dL — ABNORMAL HIGH (ref 70–99)
Glucose-Capillary: 309 mg/dL — ABNORMAL HIGH (ref 70–99)
Glucose-Capillary: 343 mg/dL — ABNORMAL HIGH (ref 70–99)
Glucose-Capillary: 363 mg/dL — ABNORMAL HIGH (ref 70–99)
Glucose-Capillary: 281 mg/dL — ABNORMAL HIGH (ref 70–99)
Glucose-Capillary: 224 mg/dL — ABNORMAL HIGH (ref 70–99)
Glucose-Capillary: 299 mg/dL — ABNORMAL HIGH (ref 70–99)
Glucose-Capillary: 301 mg/dL — ABNORMAL HIGH (ref 70–99)
Glucose-Capillary: 327 mg/dL — ABNORMAL HIGH (ref 70–99)
Glucose-Capillary: 331 mg/dL — ABNORMAL HIGH (ref 70–99)
Glucose-Capillary: 333 mg/dL — ABNORMAL HIGH (ref 70–99)
Glucose-Capillary: 342 mg/dL — ABNORMAL HIGH (ref 70–99)
Glucose-Capillary: 357 mg/dL — ABNORMAL HIGH (ref 70–99)
Glucose-Capillary: 365 mg/dL — ABNORMAL HIGH (ref 70–99)
Glucose-Capillary: 386 mg/dL — ABNORMAL HIGH (ref 70–99)
Glucose-Capillary: 389 mg/dL — ABNORMAL HIGH (ref 70–99)
Glucose-Capillary: 404 mg/dL — ABNORMAL HIGH (ref 70–99)

## 2010-03-19 LAB — URINE CULTURE: Colony Count: >=100000

## 2010-03-19 LAB — URINE MICROSCOPIC-ADD ON

## 2010-03-19 LAB — LIPASE, BLOOD
Lipase: 159 U/L (ref 23–300)
Lipase: 44 U/L (ref 11–59)

## 2010-03-19 LAB — DIFFERENTIAL
Basophils Absolute: 0 10*3/uL (ref 0.0–0.1)
Basophils Absolute: 0 10*3/uL (ref 0.0–0.1)
Basophils Absolute: 0 10*3/uL (ref 0.0–0.1)
Basophils Absolute: 0 10*3/uL (ref 0.0–0.1)
Basophils Relative: 1 % (ref 0–1)
Basophils Relative: 1 % (ref 0–1)
Eosinophils Absolute: 0.6 10*3/uL (ref 0.0–0.7)
Eosinophils Absolute: 0.4 10*3/uL (ref 0.0–0.7)
Eosinophils Absolute: 0.1 10*3/uL (ref 0.0–0.7)
Eosinophils Absolute: 0.1 10*3/uL (ref 0.0–0.7)
Eosinophils Relative: 1 % (ref 0–5)
Eosinophils Relative: 4 % (ref 0–5)
Eosinophils Relative: 14 % — ABNORMAL HIGH (ref 0–5)
Eosinophils Relative: 8 % — ABNORMAL HIGH (ref 0–5)
Eosinophils Relative: 1 % (ref 0–5)
Lymphocytes Relative: 17 % (ref 12–46)
Lymphocytes Relative: 16 % (ref 12–46)
Lymphocytes Relative: 17 % (ref 12–46)
Lymphs Abs: 1.5 10*3/uL (ref 0.7–4.0)
Lymphs Abs: 1.3 10*3/uL (ref 0.7–4.0)
Monocytes Absolute: 0.3 10*3/uL (ref 0.1–1.0)
Monocytes Absolute: 0.3 10*3/uL (ref 0.1–1.0)
Monocytes Absolute: 0.5 10*3/uL (ref 0.1–1.0)
Monocytes Relative: 5 % (ref 3–12)
Monocytes Relative: 7 % (ref 3–12)
Neutro Abs: 6.8 10*3/uL (ref 1.7–7.7)
Neutrophils Relative %: 57 % (ref 43–77)
Neutrophils Relative %: 75 % (ref 43–77)

## 2010-03-19 LAB — BENZODIAZEPINE, QUANTITATIVE, URINE
Alprazolam (GC/LC/MS), ur confirm: NEGATIVE NG/ML
Flurazepam GC/MS Conf: NEGATIVE NG/ML
Lorazepam UR QT: 1301 NG/ML — ABNORMAL HIGH

## 2010-03-19 LAB — URINALYSIS, MICROSCOPIC ONLY
Ketones, ur: 15 mg/dL — AB
Leukocytes, UA: NEGATIVE
Nitrite: NEGATIVE
Protein, ur: NEGATIVE mg/dL

## 2010-03-19 LAB — OPIATE, QUANTITATIVE, URINE
Hydrocodone: 672 NG/ML — ABNORMAL HIGH
Oxycodone, ur: NEGATIVE NG/ML

## 2010-03-19 LAB — ETHANOL: Alcohol, Ethyl (B): <5 mg/dL (ref 0–10)

## 2010-03-19 LAB — RAPID URINE DRUG SCREEN, HOSP PERFORMED
Barbiturates: NOT DETECTED
Benzodiazepines: POSITIVE — AB
Cocaine: NOT DETECTED
Opiates: NOT DETECTED

## 2010-03-19 LAB — CARDIAC PANEL(CRET KIN+CKTOT+MB+TROPI)
CK, MB: 0.8 ng/mL (ref 0.3–4.0)
CK, MB: 0.9 ng/mL (ref 0.3–4.0)
CK, MB: 0.9 ng/mL (ref 0.3–4.0)
Relative Index: INVALID (ref 0.0–2.5)
Relative Index: INVALID (ref 0.0–2.5)
Relative Index: INVALID (ref 0.0–2.5)
Total CK: 21 U/L (ref 7–177)
Total CK: 23 U/L (ref 7–177)
Total CK: 31 U/L (ref 7–177)
Troponin I: 0.01 ng/mL (ref 0.00–0.06)

## 2010-03-19 LAB — URINE DRUGS OF ABUSE SCREEN W ALC, ROUTINE (REF LAB)
Barbiturate Quant, Ur: NEGATIVE
Benzodiazepines.: POSITIVE — AB
Cocaine Metabolites: NEGATIVE
Opiate Screen, Urine: POSITIVE — AB
Phencyclidine (PCP): NEGATIVE

## 2010-03-19 LAB — TSH: TSH: 1.004 u[IU]/mL (ref 0.350–4.500)

## 2010-03-19 LAB — POCT CARDIAC MARKERS

## 2010-03-21 LAB — COMPREHENSIVE METABOLIC PANEL
Alkaline Phosphatase: 176 U/L — ABNORMAL HIGH (ref 39–117)
BUN: 22 mg/dL (ref 6–23)
Calcium: 9.1 mg/dL (ref 8.4–10.5)
Glucose, Bld: 355 mg/dL — ABNORMAL HIGH (ref 70–99)
Total Protein: 6.3 g/dL (ref 6.0–8.3)

## 2010-03-21 LAB — URINALYSIS, ROUTINE W REFLEX MICROSCOPIC
Glucose, UA: >1000 mg/dL — AB
Leukocytes, UA: NEGATIVE
Nitrite: NEGATIVE
Protein, ur: NEGATIVE mg/dL
Urobilinogen, UA: 0.2 mg/dL (ref 0.0–1.0)

## 2010-03-21 LAB — CBC
HCT: 43.5 % (ref 36.0–46.0)
Hemoglobin: 14.6 g/dL (ref 12.0–15.0)
MCHC: 33.4 g/dL (ref 30.0–36.0)
MCV: 94.4 fL (ref 78.0–100.0)
RDW: 14.9 % (ref 11.5–15.5)

## 2010-03-21 LAB — DIFFERENTIAL
Basophils Relative: 0 % (ref 0–1)
Lymphs Abs: 1.3 10*3/uL (ref 0.7–4.0)
Monocytes Relative: 8 % (ref 3–12)
Neutro Abs: 4.1 10*3/uL (ref 1.7–7.7)
Neutrophils Relative %: 68 % (ref 43–77)

## 2010-03-21 LAB — ACETAMINOPHEN LEVEL: Acetaminophen (Tylenol), Serum: <10 ug/mL — ABNORMAL LOW (ref 10–30)

## 2010-03-21 LAB — GLUCOSE, CAPILLARY: Glucose-Capillary: 235 mg/dL — ABNORMAL HIGH (ref 70–99)

## 2010-03-21 LAB — URINE MICROSCOPIC-ADD ON

## 2010-03-21 LAB — URINE CULTURE

## 2010-04-04 LAB — BASIC METABOLIC PANEL
BUN: 21 mg/dL (ref 6–23)
CO2: 30 mEq/L (ref 19–32)
Calcium: 9.4 mg/dL (ref 8.4–10.5)
Creatinine, Ser: 0.6 mg/dL (ref 0.4–1.2)
GFR calc Af Amer: >60 mL/min (ref >60)

## 2010-04-04 LAB — CBC
MCHC: 34 g/dL (ref 30.0–36.0)
MCHC: 34 g/dL (ref 30.0–36.0)
MCHC: 34.1 g/dL (ref 30.0–36.0)
MCV: 95 fL (ref 78.0–100.0)
MCV: 95.3 fL (ref 78.0–100.0)
MCV: 95.8 fL (ref 78.0–100.0)
Platelets: 191 10*3/uL (ref 150–400)
Platelets: 195 10*3/uL (ref 150–400)
Platelets: 208 10*3/uL (ref 150–400)
RBC: 4.28 MIL/uL (ref 3.87–5.11)
RDW: 14.6 % (ref 11.5–15.5)
RDW: 14.6 % (ref 11.5–15.5)

## 2010-04-04 LAB — GLUCOSE, CAPILLARY
Glucose-Capillary: 185 mg/dL — ABNORMAL HIGH (ref 70–99)
Glucose-Capillary: 215 mg/dL — ABNORMAL HIGH (ref 70–99)
Glucose-Capillary: 251 mg/dL — ABNORMAL HIGH (ref 70–99)
Glucose-Capillary: 253 mg/dL — ABNORMAL HIGH (ref 70–99)
Glucose-Capillary: 281 mg/dL — ABNORMAL HIGH (ref 70–99)
Glucose-Capillary: 299 mg/dL — ABNORMAL HIGH (ref 70–99)
Glucose-Capillary: 303 mg/dL — ABNORMAL HIGH (ref 70–99)
Glucose-Capillary: 309 mg/dL — ABNORMAL HIGH (ref 70–99)
Glucose-Capillary: 323 mg/dL — ABNORMAL HIGH (ref 70–99)
Glucose-Capillary: 341 mg/dL — ABNORMAL HIGH (ref 70–99)
Glucose-Capillary: 362 mg/dL — ABNORMAL HIGH (ref 70–99)
Glucose-Capillary: 90 mg/dL (ref 70–99)

## 2010-04-04 LAB — DIFFERENTIAL
Eosinophils Relative: 4 % (ref 0–5)
Lymphocytes Relative: 45 % (ref 12–46)
Lymphs Abs: 2 10*3/uL (ref 0.7–4.0)
Monocytes Absolute: 0.4 10*3/uL (ref 0.1–1.0)
Neutro Abs: 1.8 10*3/uL (ref 1.7–7.7)

## 2010-04-04 LAB — COMPREHENSIVE METABOLIC PANEL
AST: 41 U/L — ABNORMAL HIGH (ref 0–37)
AST: 44 U/L — ABNORMAL HIGH (ref 0–37)
Albumin: 3.6 g/dL (ref 3.5–5.2)
Albumin: 3.6 g/dL (ref 3.5–5.2)
BUN: 20 mg/dL (ref 6–23)
BUN: 24 mg/dL — ABNORMAL HIGH (ref 6–23)
Calcium: 9.4 mg/dL (ref 8.4–10.5)
Calcium: 9.4 mg/dL (ref 8.4–10.5)
Creatinine, Ser: 0.51 mg/dL (ref 0.4–1.2)
Creatinine, Ser: 0.65 mg/dL (ref 0.4–1.2)
GFR calc Af Amer: >60 mL/min (ref >60)
GFR calc Af Amer: >60 mL/min (ref >60)

## 2010-04-05 ENCOUNTER — Emergency Department (HOSPITAL_COMMUNITY)
Admission: EM | Admit: 2010-04-05 | Discharge: 2010-04-06 | Disposition: A | Discharge status: Home / Self Care | Payer: Medicare Other | Attending: Emergency Medicine | Admitting: Emergency Medicine

## 2010-04-05 DIAGNOSIS — E1142 Type 2 diabetes mellitus with diabetic polyneuropathy: Secondary | ICD-10-CM | POA: Insufficient documentation

## 2010-04-05 DIAGNOSIS — Z794 Long term (current) use of insulin: Secondary | ICD-10-CM | POA: Insufficient documentation

## 2010-04-05 DIAGNOSIS — M549 Dorsalgia, unspecified: Secondary | ICD-10-CM | POA: Insufficient documentation

## 2010-04-05 DIAGNOSIS — G8929 Other chronic pain: Secondary | ICD-10-CM | POA: Insufficient documentation

## 2010-04-05 DIAGNOSIS — E1049 Type 1 diabetes mellitus with other diabetic neurological complication: Secondary | ICD-10-CM | POA: Insufficient documentation

## 2010-04-05 DIAGNOSIS — I10 Essential (primary) hypertension: Secondary | ICD-10-CM | POA: Insufficient documentation

## 2010-04-05 LAB — GLUCOSE, CAPILLARY
Glucose-Capillary: 230 mg/dL — ABNORMAL HIGH (ref 70–99)
Glucose-Capillary: 251 mg/dL — ABNORMAL HIGH (ref 70–99)
Glucose-Capillary: 266 mg/dL — ABNORMAL HIGH (ref 70–99)
Glucose-Capillary: 267 mg/dL — ABNORMAL HIGH (ref 70–99)
Glucose-Capillary: 282 mg/dL — ABNORMAL HIGH (ref 70–99)
Glucose-Capillary: 289 mg/dL — ABNORMAL HIGH (ref 70–99)
Glucose-Capillary: 293 mg/dL — ABNORMAL HIGH (ref 70–99)
Glucose-Capillary: 302 mg/dL — ABNORMAL HIGH (ref 70–99)
Glucose-Capillary: 302 mg/dL — ABNORMAL HIGH (ref 70–99)
Glucose-Capillary: 303 mg/dL — ABNORMAL HIGH (ref 70–99)
Glucose-Capillary: 306 mg/dL — ABNORMAL HIGH (ref 70–99)
Glucose-Capillary: 309 mg/dL — ABNORMAL HIGH (ref 70–99)
Glucose-Capillary: 309 mg/dL — ABNORMAL HIGH (ref 70–99)
Glucose-Capillary: 316 mg/dL — ABNORMAL HIGH (ref 70–99)
Glucose-Capillary: 356 mg/dL — ABNORMAL HIGH (ref 70–99)
Glucose-Capillary: 364 mg/dL — ABNORMAL HIGH (ref 70–99)
Glucose-Capillary: 374 mg/dL — ABNORMAL HIGH (ref 70–99)
Glucose-Capillary: 400 mg/dL — ABNORMAL HIGH (ref 70–99)
Glucose-Capillary: 417 mg/dL — ABNORMAL HIGH (ref 70–99)
Glucose-Capillary: 421 mg/dL — ABNORMAL HIGH (ref 70–99)
Glucose-Capillary: 421 mg/dL — ABNORMAL HIGH (ref 70–99)
Glucose-Capillary: 494 mg/dL — ABNORMAL HIGH (ref 70–99)
Glucose-Capillary: 215 mg/dL — ABNORMAL HIGH (ref 70–99)
Glucose-Capillary: 269 mg/dL — ABNORMAL HIGH (ref 70–99)
Glucose-Capillary: 310 mg/dL — ABNORMAL HIGH (ref 70–99)
Glucose-Capillary: 352 mg/dL — ABNORMAL HIGH (ref 70–99)
Glucose-Capillary: 367 mg/dL — ABNORMAL HIGH (ref 70–99)

## 2010-04-05 LAB — DIFFERENTIAL
Basophils Absolute: 0 10*3/uL (ref 0.0–0.1)
Basophils Relative: 0 % (ref 0–1)
Eosinophils Absolute: 0.1 10*3/uL (ref 0.0–0.7)
Eosinophils Relative: 2 % (ref 0–5)
Eosinophils Relative: 2 % (ref 0–5)
Lymphocytes Relative: 32 % (ref 12–46)
Monocytes Absolute: 0.4 10*3/uL (ref 0.1–1.0)
Monocytes Relative: 6 % (ref 3–12)
Neutrophils Relative %: 70 % (ref 43–77)

## 2010-04-05 LAB — URINALYSIS, ROUTINE W REFLEX MICROSCOPIC
Bilirubin Urine: NEGATIVE
Glucose, UA: 500 mg/dL — AB
Hgb urine dipstick: NEGATIVE
Ketones, ur: NEGATIVE mg/dL
Ketones, ur: NEGATIVE mg/dL
Nitrite: NEGATIVE
Nitrite: NEGATIVE
Specific Gravity, Urine: 1.026 (ref 1.005–1.030)
pH: 6 (ref 5.0–8.0)
pH: 5 (ref 5.0–8.0)

## 2010-04-05 LAB — CBC
HCT: 40.8 % (ref 36.0–46.0)
HCT: 42.7 % (ref 36.0–46.0)
Hemoglobin: 14.3 g/dL (ref 12.0–15.0)
Hemoglobin: 12.7 g/dL (ref 12.0–15.0)
Hemoglobin: 15.2 g/dL — ABNORMAL HIGH (ref 12.0–15.0)
MCHC: 35.2 g/dL (ref 30.0–36.0)
MCHC: 34.9 g/dL (ref 30.0–36.0)
MCHC: 35.5 g/dL (ref 30.0–36.0)
MCHC: 35.6 g/dL (ref 30.0–36.0)
MCV: 94.1 fL (ref 78.0–100.0)
Platelets: 221 10*3/uL (ref 150–400)
RBC: 4.81 MIL/uL (ref 3.87–5.11)
RBC: 3.75 MIL/uL — ABNORMAL LOW (ref 3.87–5.11)
RBC: 4.54 MIL/uL (ref 3.87–5.11)
RDW: 14 % (ref 11.5–15.5)
RDW: 13.9 % (ref 11.5–15.5)
WBC: 4.6 10*3/uL (ref 4.0–10.5)

## 2010-04-05 LAB — RAPID URINE DRUG SCREEN, HOSP PERFORMED
Barbiturates: NOT DETECTED
Benzodiazepines: POSITIVE — AB
Cocaine: NOT DETECTED
Opiates: POSITIVE — AB

## 2010-04-05 LAB — BASIC METABOLIC PANEL
BUN: 24 mg/dL — ABNORMAL HIGH (ref 6–23)
CO2: 27 mEq/L (ref 19–32)
CO2: 28 mEq/L (ref 19–32)
Calcium: 9 mg/dL (ref 8.4–10.5)
Calcium: 9.4 mg/dL (ref 8.4–10.5)
Chloride: 98 mEq/L (ref 96–112)
Creatinine, Ser: 0.7 mg/dL (ref 0.4–1.2)
Creatinine, Ser: 0.71 mg/dL (ref 0.4–1.2)
GFR calc Af Amer: >60 mL/min (ref >60)
GFR calc Af Amer: >60 mL/min (ref >60)
GFR calc non Af Amer: >60 mL/min (ref >60)
Glucose, Bld: 257 mg/dL — ABNORMAL HIGH (ref 70–99)
Glucose, Bld: 143 mg/dL — ABNORMAL HIGH (ref 70–99)
Potassium: 3.7 mEq/L (ref 3.5–5.1)
Sodium: 136 mEq/L (ref 135–145)
Sodium: 134 mEq/L — ABNORMAL LOW (ref 135–145)

## 2010-04-05 LAB — COMPREHENSIVE METABOLIC PANEL
ALT: 68 U/L — ABNORMAL HIGH (ref 0–35)
AST: 54 U/L — ABNORMAL HIGH (ref 0–37)
Albumin: 3.5 g/dL (ref 3.5–5.2)
Alkaline Phosphatase: 196 U/L — ABNORMAL HIGH (ref 39–117)
Chloride: 99 mEq/L (ref 96–112)
GFR calc Af Amer: >60 mL/min (ref >60)
Potassium: 4.3 mEq/L (ref 3.5–5.1)
Sodium: 135 mEq/L (ref 135–145)
Total Bilirubin: 0.6 mg/dL (ref 0.3–1.2)

## 2010-04-05 LAB — URINE MICROSCOPIC-ADD ON

## 2010-04-06 LAB — DIFFERENTIAL
Basophils Absolute: 0 10*3/uL (ref 0.0–0.1)
Basophils Absolute: 0 10*3/uL (ref 0.0–0.1)
Basophils Relative: 1 % (ref 0–1)
Basophils Relative: 0 % (ref 0–1)
Eosinophils Absolute: 0.1 10*3/uL (ref 0.0–0.7)
Eosinophils Relative: 3 % (ref 0–5)
Lymphocytes Relative: 48 % — ABNORMAL HIGH (ref 12–46)
Lymphs Abs: 1.6 10*3/uL (ref 0.7–4.0)
Monocytes Absolute: 0.2 10*3/uL (ref 0.1–1.0)
Monocytes Absolute: 0.4 10*3/uL (ref 0.1–1.0)
Monocytes Relative: 7 % (ref 3–12)
Monocytes Relative: 8 % (ref 3–12)
Monocytes Relative: 7 % (ref 3–12)
Neutro Abs: 1.4 10*3/uL — ABNORMAL LOW (ref 1.7–7.7)
Neutro Abs: 3.2 10*3/uL (ref 1.7–7.7)
Neutrophils Relative %: 40 % — ABNORMAL LOW (ref 43–77)
Neutrophils Relative %: 64 % (ref 43–77)

## 2010-04-06 LAB — CBC
HCT: 37.7 % (ref 36.0–46.0)
Hemoglobin: 12.4 g/dL (ref 12.0–15.0)
Hemoglobin: 13.4 g/dL (ref 12.0–15.0)
Hemoglobin: 14.2 g/dL (ref 12.0–15.0)
MCHC: 35.4 g/dL (ref 30.0–36.0)
MCV: 95.6 fL (ref 78.0–100.0)
Platelets: 189 10*3/uL (ref 150–400)
Platelets: 194 10*3/uL (ref 150–400)
RBC: 3.61 MIL/uL — ABNORMAL LOW (ref 3.87–5.11)
RBC: 3.65 MIL/uL — ABNORMAL LOW (ref 3.87–5.11)
RBC: 4.24 MIL/uL (ref 3.87–5.11)
RDW: 13.6 % (ref 11.5–15.5)
RDW: 14 % (ref 11.5–15.5)
WBC: 3.4 10*3/uL — ABNORMAL LOW (ref 4.0–10.5)
WBC: 4.3 10*3/uL (ref 4.0–10.5)
WBC: 5 10*3/uL (ref 4.0–10.5)

## 2010-04-06 LAB — GLUCOSE, CAPILLARY
Glucose-Capillary: 153 mg/dL — ABNORMAL HIGH (ref 70–99)
Glucose-Capillary: 203 mg/dL — ABNORMAL HIGH (ref 70–99)
Glucose-Capillary: 211 mg/dL — ABNORMAL HIGH (ref 70–99)
Glucose-Capillary: 218 mg/dL — ABNORMAL HIGH (ref 70–99)
Glucose-Capillary: 221 mg/dL — ABNORMAL HIGH (ref 70–99)
Glucose-Capillary: 225 mg/dL — ABNORMAL HIGH (ref 70–99)
Glucose-Capillary: 267 mg/dL — ABNORMAL HIGH (ref 70–99)
Glucose-Capillary: 272 mg/dL — ABNORMAL HIGH (ref 70–99)
Glucose-Capillary: 279 mg/dL — ABNORMAL HIGH (ref 70–99)
Glucose-Capillary: 289 mg/dL — ABNORMAL HIGH (ref 70–99)
Glucose-Capillary: 294 mg/dL — ABNORMAL HIGH (ref 70–99)
Glucose-Capillary: 296 mg/dL — ABNORMAL HIGH (ref 70–99)
Glucose-Capillary: 313 mg/dL — ABNORMAL HIGH (ref 70–99)
Glucose-Capillary: 381 mg/dL — ABNORMAL HIGH (ref 70–99)

## 2010-04-06 LAB — URINE CULTURE

## 2010-04-06 LAB — URINALYSIS, ROUTINE W REFLEX MICROSCOPIC
Hgb urine dipstick: NEGATIVE
Specific Gravity, Urine: 1.031 — ABNORMAL HIGH (ref 1.005–1.030)
pH: 5 (ref 5.0–8.0)

## 2010-04-06 LAB — COMPREHENSIVE METABOLIC PANEL
ALT: 33 U/L (ref 0–35)
AST: 25 U/L (ref 0–37)
Albumin: 2.9 g/dL — ABNORMAL LOW (ref 3.5–5.2)
Alkaline Phosphatase: 140 U/L — ABNORMAL HIGH (ref 39–117)
BUN: 14 mg/dL (ref 6–23)
Chloride: 101 mEq/L (ref 96–112)
Potassium: 3.5 mEq/L (ref 3.5–5.1)
Sodium: 138 mEq/L (ref 135–145)
Total Bilirubin: 0.5 mg/dL (ref 0.3–1.2)
Total Protein: 5.8 g/dL — ABNORMAL LOW (ref 6.0–8.3)

## 2010-04-06 LAB — URINE MICROSCOPIC-ADD ON

## 2010-04-06 LAB — BASIC METABOLIC PANEL
BUN: 14 mg/dL (ref 6–23)
CO2: 26 mEq/L (ref 19–32)
CO2: 29 mEq/L (ref 19–32)
Calcium: 8.4 mg/dL (ref 8.4–10.5)
Calcium: 9.3 mg/dL (ref 8.4–10.5)
Chloride: 96 mEq/L (ref 96–112)
Creatinine, Ser: 0.52 mg/dL (ref 0.4–1.2)
Creatinine, Ser: 0.63 mg/dL (ref 0.4–1.2)
Creatinine, Ser: 0.68 mg/dL (ref 0.4–1.2)
GFR calc Af Amer: >60 mL/min (ref >60)
GFR calc Af Amer: >60 mL/min (ref >60)
GFR calc non Af Amer: >60 mL/min (ref >60)
GFR calc non Af Amer: >60 mL/min (ref >60)
Sodium: 135 mEq/L (ref 135–145)

## 2010-04-06 LAB — MAGNESIUM: Magnesium: 1.6 mg/dL (ref 1.5–2.5)

## 2010-04-06 LAB — POCT CARDIAC MARKERS: Myoglobin, poc: 65.8 ng/mL (ref 12–200)

## 2010-04-06 LAB — HEMOGLOBIN AND HEMATOCRIT, BLOOD
HCT: 38.2 % (ref 36.0–46.0)
Hemoglobin: 12.7 g/dL (ref 12.0–15.0)
Hemoglobin: 13.6 g/dL (ref 12.0–15.0)
Hemoglobin: 13.8 g/dL (ref 12.0–15.0)

## 2010-04-06 LAB — PROTIME-INR: INR: 0.86 (ref 0.00–1.49)

## 2010-04-06 LAB — APTT: aPTT: 27 seconds (ref 24–37)

## 2010-04-08 ENCOUNTER — Inpatient Hospital Stay (HOSPITAL_COMMUNITY)
Admission: EM | Admit: 2010-04-08 | Discharge: 2010-04-14 | DRG: 556 | Disposition: A | Discharge status: Skilled Nursing Facility | Payer: Medicare Other | Attending: Internal Medicine | Admitting: Internal Medicine

## 2010-04-08 ENCOUNTER — Emergency Department (HOSPITAL_COMMUNITY): Payer: Medicare Other

## 2010-04-08 DIAGNOSIS — E119 Type 2 diabetes mellitus without complications: Secondary | ICD-10-CM | POA: Diagnosis present

## 2010-04-08 DIAGNOSIS — Z9181 History of falling: Secondary | ICD-10-CM

## 2010-04-08 DIAGNOSIS — G609 Hereditary and idiopathic neuropathy, unspecified: Secondary | ICD-10-CM | POA: Diagnosis present

## 2010-04-08 DIAGNOSIS — F341 Dysthymic disorder: Secondary | ICD-10-CM | POA: Diagnosis present

## 2010-04-08 DIAGNOSIS — I10 Essential (primary) hypertension: Secondary | ICD-10-CM | POA: Diagnosis present

## 2010-04-08 DIAGNOSIS — R209 Unspecified disturbances of skin sensation: Secondary | ICD-10-CM | POA: Diagnosis present

## 2010-04-08 DIAGNOSIS — IMO0001 Reserved for inherently not codable concepts without codable children: Principal | ICD-10-CM | POA: Diagnosis present

## 2010-04-08 DIAGNOSIS — K3184 Gastroparesis: Secondary | ICD-10-CM | POA: Diagnosis present

## 2010-04-08 DIAGNOSIS — Z794 Long term (current) use of insulin: Secondary | ICD-10-CM

## 2010-04-08 DIAGNOSIS — M79609 Pain in unspecified limb: Secondary | ICD-10-CM | POA: Diagnosis present

## 2010-04-08 LAB — BASIC METABOLIC PANEL
GFR calc non Af Amer: >60 mL/min (ref >60)
Potassium: 4.3 mEq/L (ref 3.5–5.1)
Sodium: 137 mEq/L (ref 135–145)

## 2010-04-08 LAB — URINALYSIS, ROUTINE W REFLEX MICROSCOPIC
Ketones, ur: NEGATIVE mg/dL
Nitrite: NEGATIVE
Specific Gravity, Urine: 1.031 — ABNORMAL HIGH (ref 1.005–1.030)
Urobilinogen, UA: 0.2 mg/dL (ref 0.0–1.0)
pH: 5 (ref 5.0–8.0)

## 2010-04-08 LAB — DIFFERENTIAL
Basophils Absolute: 0 10*3/uL (ref 0.0–0.1)
Basophils Relative: 1 % (ref 0–1)
Eosinophils Absolute: 0.1 10*3/uL (ref 0.0–0.7)
Eosinophils Relative: 3 % (ref 0–5)
Lymphocytes Relative: 35 % (ref 12–46)

## 2010-04-08 LAB — CBC
Platelets: 197 10*3/uL (ref 150–400)
RDW: 13.9 % (ref 11.5–15.5)
WBC: 4 10*3/uL (ref 4.0–10.5)

## 2010-04-08 LAB — GLUCOSE, CAPILLARY

## 2010-04-08 NOTE — H&P (Signed)
NAME:  Katherine Carr, Katherine Carr            ACCOUNT NO.:  [id number]  MEDICAL RECORD NO.:  [id number]           PATIENT TYPE:  E  LOCATION:  [facility]                         FACILITY:  [facility]  PHYSICIAN:  Tana Felts, MD     DATE OF BIRTH:  01/08/1940  DATE OF ADMISSION:  04/08/2010 DATE OF DISCHARGE:                             HISTORY & PHYSICAL   CHIEF COMPLAINT:  Fall.  HISTORY OF PRESENT ILLNESS:  This is a 70 year old Caucasian woman with a past medical history notable for depression, type 2 diabetes complicated by peripheral neuropathy and gastroparesis, and fibromyalgia, who comes in today after falling while getting out of bed.  Of note, she was admitted approximately 1 month ago to the hospitalist service for similar reasons and upon discharge, it was recommended that she go to skilled nursing home.  She refused placement at this time, but is now amenable to placement there.  In fact, she has nearly secured a place at [facility] in [place], though I think she is still sorting out the financial details.  This morning, she was getting out of bed because she had to use the bathroom and was trying to get into a wheelchair chair when she tripped because of her foot drop.  She was a bit dizzy before the fall, but denies any loss of consciousness or head trauma.  She does not recall any chest pain.  Denies any shortness of breath, cough or GI symptoms other than some chronic stomach pain.  She was unable to get up due to severe weakness, so she called EMS and was brought to the hospital.    She notes that over the last month her physical status has deteriorated.  She has lost about 15 pounds and is really only eating cereal and snacks at home.  She feels that she is unable to take care herself.  She does not have family nearby and her only source of support appears to be a friend named Fannie Knee.  She does not have any  help and still lives alone.  As well as mentioned above,  she is interested in placement into a nursing facility or assisted living where she can have more help with her day-to-day care.  The patient also notes that her depression also been worse in the last few weeks, though she denies any suicidal ideation.    REVIEW OF SYSTEMS:  Otherwise negative for 12 systems.  PAST MEDICAL HISTORY: 1. Type 2 diabetes. 2. Peripheral neuropathy. 3. Gastroparesis. 4. Diverticulosis. 5. Depression and anxiety. 6. Hypertension. 7. Fibromyalgia. 8. Fatty liver disease per notes.  PAST SURGICAL HISTORY: 1. Carpal tunnel repair. 2. Left breast biopsy. 3. Lumbar decompression in 2007. 4. Cervical decompression at 2006. 5. Colonoscopy.  FAMILY HISTORY:  Notable for diabetes.  SOCIAL HISTORY:  The patient lives alone and has minimal social support. She has been married and divorced three times.  Has one son.  She has not worked since 2001.  She notes that she smoked in the past, but quit about 18 years ago.  No other bad habits.  ALLERGIES:  The patient has multiple drug allergies, these include:  1. CODEINE. 2. IV CONTRAST. 3. GADOLINIUM. 4. PROMETHAZINE. 5. METOCLOPRAMIDE. 6. LORAZEPAM. 7. METFORMIN. 8. ROSIGLITAZONE. 9. FENTANYL. 10.LEVOFLOXACIN. 11.GABAPENTIN. 12.VERAPAMIL. 13.ZOLOFT. 14.CYMBALTA. 15.OXYCODONE. 16.MORPHINE. 17.She is allergic to CRABMEAT.  MEDICATIONS:  Include: 1. Humulin 500 units per mL.  She takes 67 units in the morning, 60     with lunch and 67 with dinner. 2. Alprazolam 0.5 mg at bedtime as needed. 3. Estradiol 0.5 mg daily. 4. Hydrocodone acetaminophen 5/325 mg every 4-6 hours as needed. 5. Klonopin 0.2 mg twice a day.  Of note, she ran out of this     medication about 2 weeks ago. 6. Lexapro 20 mg daily. 7. Lidoderm patch to her back and leg daily. 8. Nexium 40 mg daily. 9. Toprol XL 25 mg once a day.  PHYSICAL EXAMINATION:  VITAL SIGNS:  Temperature 97,7, pulse 79, blood pressure 136/60, respiratory  rate 20. GENERAL:  This is an obese elderly woman, who appears anxious and in mild emotional distress. HEENT:  Pupils are equal, round and reactive to light.  Extraocular movements are intact.  There is no scleral icterus or conjunctivitis. Oropharynx is clear with moist mucous membranes.  There is no cervical lymphadenopathy. LUNGS:  Clear to auscultation bilaterally. CARDIAC EXAMINATION:  Reveals regular rate and rhythm with no murmurs, rubs or gallops other than the possible occasional S4. ABDOMEN:  Soft, nontender, nondistended with normal bowel sounds.  She does complain of some general pain, but it is not focally tender. EXTREMITIES:  Thin, warm, well-perfused.  No cyanosis, clubbing or edema. SKIN:  Notable for being slightly dry especially on her feet.  There were no ulcers noted on her feet. NEUROLOGICALLY:  She has foot drop in the left foot and some numbness there as well as neuropathic pain in both lower extremities.  LABORATORY DATA:  White count 4.0, hemoglobin 13.5, platelets 197,000. Chemistries are notable only for BUN of 32, normal creatinine of 0.67, glucose of 249.  Urinalysis was normal other than specific gravity of 1.031 and elevated glucose.  DIAGNOSTIC STUDIES:  She had a left hip x-ray performed, which showed no abnormality.  IMPRESSION:  This is a 70 year old woman with multiple medical problems complicated by what appears to be pretty severe depression, who presents with an acute fall at home, which appears mechanical in nature and an overall failure to thrive in the last month.  She is more amenable to placement in nursing facility, but feels unable to go home at this time as she is unable to care for herself.  PLAN: 1. Recent fall:  The patient had an x-ray, which showed no evidence of     acute fracture, injury.  She does have leg pain but this does not     appear to be above her baseline neuropathic pain, so we will     continue to treat this with  Percocet at her home dose.  She will     need fall precautions and assistance getting out of bed. 2. Depression:  We will continue the patient's antianxiety and     antidepressive medications.  She did not want to see a psychiatrist     at this time, but we can continue to address this with her.  She     has no suicidal ideation at this time, but does clearly express     some depressive symptoms. 3. Diabetes:  We will check the hemoglobin A1c and she will be on     sliding scale insulin.  I will  put her on somewhat smaller dose     of her home prandial insulin given that she has been eating poorly     lately, but this may need to be titrated up if this does not     achieve adequate glycemic control.  She may benefit from basal     insulin once her insulin needs are established. 4. Hypertension:  The patient will continue on her Toprol XL. 5. Failure to thrive:  The patient is in the process of securing a     place at [facility] and hopefully discharge could     be arranged there since she does not feel safe at home.  PT and OT     consults as well as social work consults were ordered to further     assess her needs after hospitalization.     Tana Felts, MD     NB/MEDQ  D:  04/08/2010  T:  04/08/2010  Job:  [id number]  Electronically Signed by Tana Felts M.D. on 04/08/2010 08:02:52 PM

## 2010-04-09 LAB — GLUCOSE, CAPILLARY
Glucose-Capillary: 234 mg/dL — ABNORMAL HIGH (ref 70–99)
Glucose-Capillary: 218 mg/dL — ABNORMAL HIGH (ref 70–99)
Glucose-Capillary: 250 mg/dL — ABNORMAL HIGH (ref 70–99)
Glucose-Capillary: 276 mg/dL — ABNORMAL HIGH (ref 70–99)

## 2010-04-09 LAB — CK: Total CK: 24 U/L (ref 7–177)

## 2010-04-09 LAB — MAGNESIUM: Magnesium: 2 mg/dL (ref 1.5–2.5)

## 2010-04-09 LAB — COMPREHENSIVE METABOLIC PANEL
AST: 28 U/L (ref 0–37)
BUN: 20 mg/dL (ref 6–23)
CO2: 26 mEq/L (ref 19–32)
Calcium: 8.9 mg/dL (ref 8.4–10.5)
Creatinine, Ser: 0.6 mg/dL (ref 0.4–1.2)
GFR calc Af Amer: >60 mL/min (ref >60)
GFR calc non Af Amer: >60 mL/min (ref >60)
Total Bilirubin: 0.6 mg/dL (ref 0.3–1.2)

## 2010-04-09 LAB — CBC
Hemoglobin: 14 g/dL (ref 12.0–15.0)
MCH: 32.8 pg (ref 26.0–34.0)
MCHC: 34.4 g/dL (ref 30.0–36.0)
MCV: 95.3 fL (ref 78.0–100.0)

## 2010-04-09 LAB — LIPID PANEL
Cholesterol: 213 mg/dL — ABNORMAL HIGH (ref 0–200)
HDL: 42 mg/dL (ref >39)

## 2010-04-09 LAB — HEMOGLOBIN A1C: Hgb A1c MFr Bld: 6.2 % — ABNORMAL HIGH (ref <5.7)

## 2010-04-09 LAB — PHOSPHORUS: Phosphorus: 3.3 mg/dL (ref 2.3–4.6)

## 2010-04-10 LAB — CBC
MCV: 95.5 fL (ref 78.0–100.0)
Platelets: 170 10*3/uL (ref 150–400)
RBC: 4.03 MIL/uL (ref 3.87–5.11)
RDW: 13.8 % (ref 11.5–15.5)
WBC: 4.3 10*3/uL (ref 4.0–10.5)

## 2010-04-10 LAB — DIFFERENTIAL
Basophils Absolute: 0 10*3/uL (ref 0.0–0.1)
Basophils Relative: 1 % (ref 0–1)
Eosinophils Absolute: 0.1 10*3/uL (ref 0.0–0.7)
Eosinophils Relative: 3 % (ref 0–5)
Neutrophils Relative %: 35 % — ABNORMAL LOW (ref 43–77)

## 2010-04-10 LAB — GLUCOSE, CAPILLARY
Glucose-Capillary: 214 mg/dL — ABNORMAL HIGH (ref 70–99)
Glucose-Capillary: 214 mg/dL — ABNORMAL HIGH (ref 70–99)
Glucose-Capillary: 206 mg/dL — ABNORMAL HIGH (ref 70–99)

## 2010-04-10 LAB — COMPREHENSIVE METABOLIC PANEL
AST: 24 U/L (ref 0–37)
Albumin: 3.3 g/dL — ABNORMAL LOW (ref 3.5–5.2)
Alkaline Phosphatase: 100 U/L (ref 39–117)
BUN: 20 mg/dL (ref 6–23)
Chloride: 104 mEq/L (ref 96–112)
Creatinine, Ser: 0.53 mg/dL (ref 0.4–1.2)
GFR calc Af Amer: >60 mL/min (ref >60)
Potassium: 4.7 mEq/L (ref 3.5–5.1)
Total Bilirubin: 0.5 mg/dL (ref 0.3–1.2)
Total Protein: 6 g/dL (ref 6.0–8.3)

## 2010-04-10 NOTE — Discharge Summary (Signed)
NAME:  Katherine Carr, Katherine Carr            ACCOUNT NO.:  [id number]  MEDICAL RECORD NO.:  [id number]           PATIENT TYPE:  I  LOCATION:  1303                         FACILITY:  [facility]  PHYSICIAN:  Talmage Nap, MD  DATE OF BIRTH:  Feb 18, 1940  DATE OF ADMISSION:  04/08/2010 DATE OF DISCHARGE:  04/10/2010 OR 04/11/2010                        DISCHARGE SUMMARY - REFERRING   PRIMARY CARE PHYSICIAN:  Stacie Acres. White, M.D.  DISCHARGE DIAGNOSES: 1. Fall/myalgia.  No fractures seen.  The patient managed with pain     medication. 2. Type 2 diabetes mellitus. 3. Peripheral neuropathy. 4. Gastroparesis. 5. Anxiety disorder. 6. Depression. 7. Hypertension. 8. Fibromyalgia. 9. History of fatty liver disease. 10.History of diverticulosis.  HISTORY:  The patient is a 70 year old Caucasian female with multiple medical problems who lives alone by herself who was admitted to the hospital on April 08, 2010, by Dr. Tana Felts with complaint of accidental fall when the patient was attempting to use the bathroom. She, however, denied any premonitory symptoms prior to the onset of fall.  Prior to the fall the patient was complaining about aches and pain, but she denied any history of loss of consciousness.  No chest pain.  No shortness of breath, but the aches and pain was said to have persisted.  Hence, the patient was brought to the emergency room to be evaluated.  PAST SURGICAL HISTORY: 1. Carpal tunnel surgery. 2. Left breast biopsy. 3. Lumbar decompression surgery in 2007. 4. Cervical decompression surgery in 2006. 5. Colonoscopy and findings are that of diverticulosis.  PREADMISSION MEDICATIONS: 1. Humulin 5000 units per mL.  She takes 75 units subcutaneous q.a.m.     and 15 units subcutaneous q.p.m. 2. Alprazolam 0.5 mg p.o. at bedtime p.r.n. 3. Estradiol 0.5 mg p.o. daily. 4. Hydrocodone/acetaminophen 5/325 one p.o. q.4-6 p.r.n. 5. Klonopin 0.2 mg p.o. b.i.d. 6. Lexapro 20 mg  p.o. daily. 7. Lidoderm patch apply transdermally to the leg daily. 8. Nexium 40 mg p.o. daily. 9. Toprol XL 25 mg p.o. daily.  ALLERGIES:  CODEINE, IV CONTRAST, GADOLINIUM, PROMETHAZINE, METOCLOPRAMIDE, LORAZEPAM, METFORMIN, ROSIGLITAZONE, FENTANYL, LEVOFLOXACIN, GABAPENTIN, VERAPAMIL, ZOLOFT, CYMBALTA, OXYCODONE, MORPHINE.  FOOD ALLERGIES:  CRAB MEAT.  SOCIAL HISTORY:  The patient lives alone.  No history of alcohol or tobacco use.  FAMILY HISTORY:  York Spaniel to be positive for diabetes mellitus.  REVIEW OF SYSTEMS:  Review of systems is essentially documented in the initial history and physical.  PHYSICAL EXAMINATION:  VITAL SIGNS:  At time the patient was seen by the admitting physician temperature was 97.7, pulse 78, blood pressure was 136/60, respiratory rate 20. GENERAL:  She was said to be anxious, in slight emotional distress. HEENT:  Pupils are reactive to light and extraocular muscles are intact. NECK:  No jugular venous distention.  No carotid bruit.  No lymphadenopathy. CHEST:  Clear to auscultation. CARDIOVASCULAR:  Heart sounds are 1 and 2. ABDOMEN:  Soft, nontender.  Liver, spleen and kidney not palpable. Bowel sounds are positive. EXTREMITIES:  No pedal edema. NEUROLOGIC EXAMINATION:  Nonfocal. MUSCULOSKELETAL SYSTEM:  Showed arthritic changes in the knees and in the feet. NEUROPSYCHIATRIC EVALUATION:  Unremarkable. SKIN:  Showed normal  turgor.  LABORATORY DATA:  Initial basic metabolic panel showed a sodium of 134, potassium of 3.7, chloride of 101 with a bicarbonate of 28.  Glucose is 143, BUN is 23, creatinine 0.58.  Complete blood count with differential showed a WBC of 4.0, hemoglobin of 13.5, hematocrit of 39.9, MCV of 95.2 with a platelet count of 197 with a normal differential.  Urinalysis unremarkable.  Hemoglobin A1c 6.2.  Magnesium level is 2.0.  CPK level done on April 09, 2010, 24 and a repeat complete blood count with differential done on April 10, 2010, showed a WBC of 4.3, hemoglobin of 13.1, hematocrit of 38.5, MCV of 95.5 with a platelet count of 170. Comprehensive metabolic panel showed a sodium of 139, potassium of 4.7, chloride of 104 with a bicarbonate of 29.  Glucose is 241, BUN is 20, creatinine is 0.53.  Magnesium level is 2.1.  RADIOLOGIC:  Imaging studies done include x-ray of the hip, which did not show any fracture or dislocation.  HOSPITAL COURSE:  The patient was admitted to the General Medical floor. She was started on normal saline IV to go at a rate of 75 cc an hour and Lovenox for DVT prophylaxis.  She was also given Dulcolax suppository for constipation.  Pain control was done with hydrocodone/APAP and also added for pain control was IV Dilantin as well as Lidoderm patch.  The patient was also placed on Accu-Chek with insulin sliding scale and she was restarted on home medications which include alprazolam, clonazepam, Lexapro, estradiol, Nexium and was placed on fall precautions.  She was also given Toprol 25 mg p.o. daily for blood pressure control.  At the time the patient was seen by me for the very first time which was on April 09, 2010, she complained about aches and pain and examination of the patient was essentially unremarkable and further pain control was done with Dilaudid 2 mg IV q.4 p.r.n. and she was also given Zofran for nausea.  The dose of Toprol was, however, increased to 50 mg p.o. daily. The patient was reevaluated by me today, which is April 10, 2010. Clinically improved with minimal aches and pains.  Examination was essentially unremarkable.  Her vital signs:  Blood pressure is 137/70, temperature of 97.8, pulse 66, respiratory rate 18, medically stable.  PLAN:  The plan is for the patient to be discharged either to home or skilled nursing facility.  ACTIVITY:  As tolerated.  DIET:  Diabetic ADA diet.  FOLLOWUP:  To be followed up by her primary care physician in 1 to 2 weeks.  HOME  MEDICATIONS: 1. Bisacodyl 10 mg suppository rectally p.r.n. for constipation. 2. Hydrocodone/APAP 5/325,1-2 tablets p.o q.4 p.r.n 3. Vicodin/Norco/Lortab one to two tablets p.o. q.4 p.r.n. 4. Alprazolam 0.5 mg one p.o. q.8 p.r.n. 5. Estradiol 0.5 mg one p.o. daily. 6. Humulin insulin sliding scale subcutaneous t.i.d. before meals. 7. Klonopin (clonazepam) 0.5 mg one p.o. b.i.d. 8. Lexapro (citalopram) 20 mg one p.o. q.8. 9. Lidoderm patch 5% one to three patches topically transdermal q.24     hours. 10.Nexium (omeprazole) 40 mg p.o. daily. 11.Toprol XL (metoprolol succinate) 25 mg one p.o. q.a.m. 12.Tramadol 50 mg two tablets p.o. t.i.d.     Talmage Nap, MD     CN/MEDQ  D:  04/10/2010  T:  04/10/2010  Job:  [id number]  cc:   Stacie Acres. Cliffton Asters, M.D. Fax: [phone]  Electronically Signed by Talmage Nap  on 04/10/2010 03:03:03 PM

## 2010-04-11 LAB — GLUCOSE, CAPILLARY
Glucose-Capillary: 220 mg/dL — ABNORMAL HIGH (ref 70–99)
Glucose-Capillary: 234 mg/dL — ABNORMAL HIGH (ref 70–99)

## 2010-04-12 LAB — GLUCOSE, CAPILLARY: Glucose-Capillary: 235 mg/dL — ABNORMAL HIGH (ref 70–99)

## 2010-04-13 LAB — GLUCOSE, CAPILLARY
Glucose-Capillary: 231 mg/dL — ABNORMAL HIGH (ref 70–99)
Glucose-Capillary: 161 mg/dL — ABNORMAL HIGH (ref 70–99)
Glucose-Capillary: 221 mg/dL — ABNORMAL HIGH (ref 70–99)
Glucose-Capillary: 214 mg/dL — ABNORMAL HIGH (ref 70–99)

## 2010-04-14 LAB — GLUCOSE, CAPILLARY: Glucose-Capillary: 188 mg/dL — ABNORMAL HIGH (ref 70–99)

## 2010-04-17 ENCOUNTER — Emergency Department (HOSPITAL_COMMUNITY)
Admission: EM | Admit: 2010-04-17 | Discharge: 2010-04-17 | Disposition: A | Discharge status: Home / Self Care | Payer: Medicare Other | Attending: Emergency Medicine | Admitting: Emergency Medicine

## 2010-04-17 DIAGNOSIS — G8929 Other chronic pain: Secondary | ICD-10-CM | POA: Insufficient documentation

## 2010-04-17 DIAGNOSIS — F341 Dysthymic disorder: Secondary | ICD-10-CM | POA: Insufficient documentation

## 2010-04-17 DIAGNOSIS — M79609 Pain in unspecified limb: Secondary | ICD-10-CM | POA: Insufficient documentation

## 2010-04-17 DIAGNOSIS — E1142 Type 2 diabetes mellitus with diabetic polyneuropathy: Secondary | ICD-10-CM | POA: Insufficient documentation

## 2010-04-17 DIAGNOSIS — E1149 Type 2 diabetes mellitus with other diabetic neurological complication: Secondary | ICD-10-CM | POA: Insufficient documentation

## 2010-04-17 DIAGNOSIS — I10 Essential (primary) hypertension: Secondary | ICD-10-CM | POA: Insufficient documentation

## 2010-04-18 LAB — DIFFERENTIAL
Basophils Absolute: 0 10*3/uL (ref 0.0–0.1)
Basophils Absolute: 0 10*3/uL (ref 0.0–0.1)
Basophils Relative: 0 % (ref 0–1)
Basophils Relative: 1 % (ref 0–1)
Eosinophils Relative: 1 % (ref 0–5)
Lymphocytes Relative: 29 % (ref 12–46)
Monocytes Absolute: 0.5 10*3/uL (ref 0.1–1.0)
Monocytes Relative: 10 % (ref 3–12)
Neutro Abs: 2.5 10*3/uL (ref 1.7–7.7)
Neutrophils Relative %: 58 % (ref 43–77)

## 2010-04-18 LAB — COMPREHENSIVE METABOLIC PANEL
AST: 31 U/L (ref 0–37)
Albumin: 3.4 g/dL — ABNORMAL LOW (ref 3.5–5.2)
Alkaline Phosphatase: 101 U/L (ref 39–117)
Alkaline Phosphatase: 117 U/L (ref 39–117)
BUN: 13 mg/dL (ref 6–23)
BUN: 8 mg/dL (ref 6–23)
CO2: 29 mEq/L (ref 19–32)
Chloride: 102 mEq/L (ref 96–112)
Creatinine, Ser: 0.61 mg/dL (ref 0.4–1.2)
GFR calc Af Amer: >60 mL/min (ref >60)
Glucose, Bld: 165 mg/dL — ABNORMAL HIGH (ref 70–99)
Potassium: 3.6 mEq/L (ref 3.5–5.1)
Potassium: 4 mEq/L (ref 3.5–5.1)
Total Bilirubin: 0.8 mg/dL (ref 0.3–1.2)
Total Bilirubin: 0.9 mg/dL (ref 0.3–1.2)
Total Protein: 6.2 g/dL (ref 6.0–8.3)

## 2010-04-18 LAB — CBC
HCT: 41.7 % (ref 36.0–46.0)
HCT: 43.1 % (ref 36.0–46.0)
Hemoglobin: 15 g/dL (ref 12.0–15.0)
MCHC: 34.9 g/dL (ref 30.0–36.0)
MCV: 95.7 fL (ref 78.0–100.0)
Platelets: 188 10*3/uL (ref 150–400)
RBC: 4.39 MIL/uL (ref 3.87–5.11)
RDW: 12.3 % (ref 11.5–15.5)
WBC: 5.1 10*3/uL (ref 4.0–10.5)

## 2010-04-18 LAB — URINALYSIS, ROUTINE W REFLEX MICROSCOPIC
Bilirubin Urine: NEGATIVE
Glucose, UA: NEGATIVE mg/dL
Hgb urine dipstick: NEGATIVE
Ketones, ur: NEGATIVE mg/dL
Protein, ur: NEGATIVE mg/dL
pH: 5 (ref 5.0–8.0)

## 2010-04-18 LAB — LACTIC ACID, PLASMA: Lactic Acid, Venous: 2.8 mmol/L — ABNORMAL HIGH (ref 0.5–2.2)

## 2010-04-24 NOTE — Discharge Summary (Signed)
  NAME:  Katherine Carr, SINKLER            ACCOUNT NO.:  [id number]  MEDICAL RECORD NO.:  [id number]           PATIENT TYPE:  I  LOCATION:  1303                         [facility]ACILITY:  [facility]  PHYSICIAN:  Hollice Espy, M.D.DATE OF BIRTH:  [birth year]  DATE OF ADMISSION:  04/08/2010 DATE OF DISCHARGE:                        DISCHARGE SUMMARY - REFERRING   ADDENDUM  ANTICIPATED DATE OF DISCHARGE:  April 13, 2010  ATTENDING PHYSICIAN:  Hollice Espy, MD  PRIMARY CARE PHYSICIAN:  Stacie Acres. Cliffton Asters, MD, [facility]  The patient's original discharge summary was covered from events from date of admission, April 7 to April 9.  The patient since that time was felt to be medically stable and evaluated.  She complains of lower extremity numbness and pain.  She was evaluated for peripheral neuropathy.  Dr.__________ added Neurontin 100 mg p.o. t.i.d. which the patient has responded well to.  She was also evaluated by physical and occupational therapy, who found her to recommend skilled nursing placement and not be okay to go back to independent living.  In addition, the only other issue was the patient's trending blood sugars. She is on a sliding scale t.i.d. before meals and as per recommendations by the diabetes coordinator.  The patient should be receiving 6 units of NovoLog insulin t.i.d. with meals in addition to her sliding scale coverage.  The rest of her medical issues are stable, and rest of her medications will continue, and plan will be patient is awaiting a and once I gets that she is felt to be medically stable, will be discharged to skilled nursing facility.  DISPOSITION:  Improved.  ACTIVITY:  As per skilled nursing facility.  FOLLOWUP:  She will follow up with Laurann Montana, PCP in the next 4-6 weeks.     Hollice Espy, M.D.     SKK/MEDQ  D:  04/13/2010  T:  04/13/2010  Job:  [id number]  cc:   Stacie Acres. Cliffton Asters, M.D. Fax: [phone]  Electronically  Signed by Virginia Rochester M.D. on 04/24/2010 02:02:26 PM

## 2010-05-16 NOTE — Assessment & Plan Note (Signed)
[facility]                         GASTROENTEROLOGY OFFICE NOTE   NAME:Katherine Carr, Katherine Carr                   MRN:          [id number]  DATE:11/01/2006                            DOB:          11-29-1940    HISTORY:  This is a 70 year old female with multiple medical problems  including diabetes mellitus, asthma, fibromyalgia, obesity, migraine  headaches, chronic reflux disease, and chronic abdominal complaints  consistent with irritable bowel syndrome. She was last evaluated in the  office on 05/07/2005 for constipation, predominant irritable bowel, and  reflux disease. For reflux, she was continued on Prevacid. For  constipation, she was placed on MiraLax. She reports to me today that  she changed from Dr. Clent Ridges to Dr. Laury Axon. She explained some of her  abdominal complaints to Dr. Laury Axon who recommended GI evaluation. The  patient reports to me problems with regurgitation. She states that she  takes her Prevacid up to four times per day. Historically, twice daily  dosage controlled her heartburn and indigestion. It seems that her  biggest problem now is regurgitation. She has had diabetes for almost  ten years.   She reports that her bowel movements are less constipated, more loose.  This, however, only occurs on Sundays when she is getting ready to go to  church. She states that stress has an impact on her bowel movements.   She has multiple allergies as listed in the healthcare form.   CURRENT MEDICATIONS:  1. Toprol.  2. Potassium chloride.  3. Hydrochlorothiazide.  4. Lipitor.  5. Prevacid.  6. Estradiol.  7. Clarinex.  8. Nasonex.  9. Astelin.  10.Etodolac.  11.Humalog insulin.  12.Mobic p.r.n.  13.Imitrex p.r.n.   Her last upper endoscopy was performed on 09/26/2004. At that time, she  was found to have a sliding hiatal hernia and benign gastric polyps. Her  last colonoscopy was performed on 02/18/2001. This was normal except for  mild  diverticulosis. The patient denies rectal bleeding or weight loss.  She has actually had weight gain.   PHYSICAL EXAMINATION:  GENERAL:  Well appearing female in no acute  distress.  VITAL SIGNS:  Blood pressure 122/68, heart rate 84 and regular, weight  187.8 pounds.  HEENT:  Sclerae anicteric, conjunctivae are pink, oral mucosa is intact.  There is no adenopathy.  LUNGS:  Clear.  HEART:  Regular.  ABDOMEN:  Soft, obese, without tenderness, mass, or hernia. Good bowel  sounds heard.  EXTREMITIES:  Without edema.   IMPRESSION:  1. Gastroesophageal reflux disease. Currently with a significant      regurgitant component. I suspect that she may have an element of      gastroparesis due to diabetes, which is worsening her reflux      symptoms.  2. Irritable bowel syndrome. Ongoing.   RECOMMENDATIONS:  1. Probiotic Align 1 p.o. daily for two weeks. Samples were provided.  2. Continue Prevacid 30 mg b.i.d.  3. Initiate metoclopramide 10 mg b.i.d. to see if this helps the      regurgitant component as she may have an element of      gastroparesis.Side effect profile reviewed.  4. GI office appointment in six weeks.  5. Ongoing general medical care with Dr. Laury Axon.     Wilhemina Bonito. Marina Goodell, MD  Electronically Signed    JNP/MedQ  DD: 11/01/2006  DT: 11/03/2006  Job #: [id number]   cc:   Lelon Perla, DO

## 2010-05-16 NOTE — Discharge Summary (Signed)
NAME:  Katherine Carr, Katherine Carr            ACCOUNT NO.:  [id number]   MEDICAL RECORD NO.:  [id number]          PATIENT TYPE:  INP   LOCATION:  1343                         FACILITY:  [facility]   PHYSICIAN:  Iva Boop, MD,FACGDATE OF BIRTH:  1940-01-07   DATE OF ADMISSION:  09/12/2007  DATE OF DISCHARGE:  09/18/2007                               DISCHARGE SUMMARY   ADMITTING DIAGNOSES:  77. A 70 year old white female with progressive nausea, upper abdominal      pain and bloating in the setting of an outpatient CT scan showing      prominent wall thickness of the distal jejunum.  Doubt infectious.      Question nonsteroidal antiinflammatory drug-induced enteropathy.      Rule out ischemia.  2. Multiple medication intolerances/allergies.  3. History of gastroesophageal reflux disease.  4. Hypertension.  5. Dyslipidemia.  6. Fibromyalgia.  7. Obesity.  8. Diabetes mellitus insulin dependent.   DISCHARGE DIAGNOSES:  5. A 70 year old white female with complaints of upper abdominal pain      and bloating with no evidence of persistent small bowel      abnormality.  Question resolved transient partial small-bowel      obstruction or nonsteroidal antiinflammatory drug-induced      enteropathy.  2. Abdominal pain felt multifactorial with a musculoskeletal component      due to costochondritis and fibromyalgia.  3. Diabetic gastroparesis/visceral neuropathy.  4. Significant anxiety with panic attacks this admission.  5. Multiple medication intolerances/allergies.  6. History of gastroesophageal reflux disease.  7. Hypertension.  8. Dyslipidemia.  9. Fibromyalgia.  10.Obesity.  11.Diabetes mellitus insulin dependent.  12.Fatty liver.   CONSULTATIONS:  None.   PROCEDURES:  1. Upper GI small-bowel follow-through.  2. Rib films.  3. Thoracic spine films.  4. Upper abdominal ultrasound.   BRIEF HISTORY:  Katherine Carr is a 70 year old white female GI patient of Dr.  Marina Goodell, primary patient of  Dr. Laury Axon, who was recently seen in the office  with complaints of bloating, upper abdominal pain and nausea which she  says has been progressive over the past 1 month.  The upper abdominal  pain has been more recent.  She had CT scan of the abdomen and pelvis  ordered as an outpatient after that visit which shows a wall thickening  of the distal jejunum.  The patient at this point states that she is  feeling very poorly and that her pain had not improved any, and the  decision was made to admit her for pain control, antiemetics, hydration  and further diagnostic workup.  Of note, she has been taking regular  NSAIDs and also uses occasional Goody's powders.  Her pain of  predominantly has been in the left upper quadrant, but does span across  her whole upper abdomen.  She also complains of some mid back pain.  She  had undergone prior colonoscopy with Dr. Marina Goodell in 2003 showing sigmoid  diverticulosis and had an EGD in 2006 with hiatal hernia, polyp in the  body of the stomach and some evidence for esophagitis.   LABORATORY STUDIES:  On admission, WBC of 3.8,  hemoglobin 14.4,  hematocrit of 40.7, MCV of 95, platelets 187.  Follow up on September 14, 2007 revealed a WBC of 4.3, hemoglobin 14.2, hematocrit of 40.3.  Electrolytes on admission within normal limits.  Glucose was 454, BUN  18, creatinine 0.68.  Follow up on September 13, 2007 revealed glucose  was 178, and on September 14, 2007, glucose was 212.  Albumin 3.3 on  admission.  LFTs normal with the exception of an alkaline phosphatase at  144.  TSH of 2.80.   Abdominal films on September 12, 2007 showed no acute abnormality.  There was contrast material in the colon and mid transverse colon from  the CT two days previous.  Follow up on September 15, 2007 - barium into  the right colon.  Upper GI and small bowel follow-through on September 15, 2007 showed a small hiatal hernia, mildly prominent esophageal  ampulla, distal  esophageal dysmotility, otherwise unremarkable.   HOSPITAL COURSE:  The patient was admitted to the service of Dr. Lina Sar who was covering the hospital.  Initially, she was placed on IV  fluids, a clear liquid diet, was given antiemetics, IV PPI and pain  control with morphine.  She was also placed on sliding scale insulin  coverage, as her diabetes was fairly poorly controlled at the time of  admission.  She initially had plain abdominal films which did not show  any evidence of obstruction or bowel edema, and it was felt that perhaps  she had had some sort of transient process there (i.e. partial small  bowel obstruction or an NSAID-induced enteropathy).  She continued to  complain of pain and constipation.  She was given a bowel prep with  initially some relief in her symptoms, but overall by September 15, 2007  was not feeling any better, complaining rather bitterly of upper  abdominal discomfort.  She was then seen by Dr. Leone Payor who felt that a  lot of her pain was musculoskeletal in etiology, and she was started on  a trial of Toradol.  She was also felt to have a significant component  of anxiety and started regularly on anxiolytics.  This did help.  She  admitted to having a couple of panic attacks during this admission, and  the around-the-clock anxiolytics were helpful.  She underwent the upper  GI and small bowel follow-through on September 15, 2007,  which was  unremarkable as the cause of her pain.  We began advancing her diet,  tried to convert her to oral pain medications which initially was  unsuccessful.  On further discussion, she had had a diagnosis of  diabetic gastroparesis made in the past that was felt to be contributing  to her symptoms.  However, she was apparently intolerant to Reglan, and  therefore we discussed a gastroparesis diet and a low gas diet in place  of medication therapy.  We also placed her on Ultram around-the-clock  for her pain.  By  September 18, 2007, she was feeling better and was  allowed discharge to home with instructions to follow up with Dr. Laury Axon  in the office in 1-2 weeks and to follow up with Dr. Yancey Flemings on  October 14, 2007 at 4:00 p.m. and to call for problems in the interim.  She was to maintain a diabetic gastroparesis diet.  She was to take her  Ultram 50 mg t.i.d. regularly for the time being with plans to wean over  the next couple weeks and then  Xanax  0.25 t.i.d. on a regular basis.  She was to stop her NSAIDs (i.e.  meloxicam) and continue metoprolol, estradiol, Lipitor, potassium,  Prevacid, Humulin insulin, NovoLog insulin and hydrochlorothiazide, all  at previous dosages.   CONDITION ON DISCHARGE:  Stable and improved.      Amy Esterwood, PA-C      Iva Boop, MD,FACG  Electronically Signed    AE/MEDQ  D:  10/02/2007  T:  10/02/2007  Job:  [id number]   cc:   Lelon Perla, DO  [address] [place], [place] [address]

## 2010-05-16 NOTE — Assessment & Plan Note (Signed)
[facility]                         GASTROENTEROLOGY OFFICE NOTE   NAME:Brannock, LAFAYE MCELMURRY                   MRN:          [id number]  DATE:01/06/2007                            DOB:          January 11, 1940    HISTORY OF PRESENT ILLNESS:  Lorma presents today for followup.  She is  a 70 year old with multiple medical problems including diabetes  mellitus, asthma, fibromyalgia, obesity, migraine headaches, chronic  back pain, gastroesophageal reflux disease, chronic abdominal complaints  consistent with irritable bowel syndrome.  She was last evaluated in the  office on November 01, 2006, principally for reflux disease with  worsening regurgitation.  Due to complaints of increased gas and  bloating, she was placed on Align one daily for two weeks.  She states  this increased her flatus.  She continued on Prevacid.  She was started  on Metoclopramide, but after considering the side effect profile, we  reviewed and did not start the drug.  She presents now for followup.   Overall, she is about the same.  She continues with regurgitation and  reflux which is worse with dietary indiscretion.  No dysphagia.  Also  nagging or throbbing left lower quadrant discomfort which she has had  for years.   She also mentions to me that her left lower quadrant discomfort seems to  involve the groin, with radiation into the left leg, down to the level  of the knee.   MEDICATIONS:  1. Toprol XL.  2. Hydrochlorothiazide.  3. Potassium chloride.  4. Prevacid.  5. Estradiol.  6. Nasacort.  7. Lipitor.  8. Humalog insulin.  9. NovoLog.  10.Mobic.  11.Imitrex.   PHYSICAL EXAMINATION:  GENERAL:  A well-appearing female, in no acute  distress.  VITAL SIGNS:  Blood pressure 110/66, heart rate 72, weight 185 pounds.  ABDOMEN:  Obese and soft, without tenderness, mass or hernia.   IMPRESSION:  1. Gastroesophageal reflux disease with regurgitation.  2. Irritable  bowel.  3. Left lower quadrant discomfort with radiation into the left leg.      Question of referred pain from the back.  4. Multiple medical problems.   RECOMMENDATIONS:  1. Continue proton pump inhibitor therapy.  2. Reflux precautions, with attention to weight loss.  3. Resume general medical care with Dr. Lelon Perla.     Wilhemina Bonito. Marina Goodell, MD  Electronically Signed    JNP/MedQ  DD: 01/06/2007  DT: 01/06/2007  Job #: [id number]   cc:   Lelon Perla, DO  Mark C. Ophelia Charter, M.D.

## 2010-05-16 NOTE — H&P (Signed)
NAME:  RAYETTE, MOGG            ACCOUNT NO.:  [id number]   MEDICAL RECORD NO.:  [id number]          PATIENT TYPE:  INP   LOCATION:  1343                         FACILITY:  [facility]   PHYSICIAN:  Hedwig Morton. Juanda Chance, MD     DATE OF BIRTH:  04-23-1940   DATE OF ADMISSION:  09/12/2007  DATE OF DISCHARGE:                              HISTORY & PHYSICAL   HISTORY OF PRESENT ILLNESS:  A 70 year old female recently seen in our  office with complaints of bloating, upper abdominal pain, and nausea.  The patient reports increasing abdominal bloating over the last month.  The upper abdominal pain is a rather new development.  The patient's  nausea has come and gone over the last month.  After being seen in our  office on September 09, 2007, a CT of the abdomen and pelvis was ordered  without IV contrast, and it revealed wall-thickening in the distal  jejunum.  Today our office telephoned the patient to see how she was  feeling and discuss her recent CAT scan results.  The patient stated she  was doing poorly, and it was decided that she would be admitted for  hydration, pain control, and antiemetics.  The patient has been taking  Mobic at home.  She uses occasional Goody Powders.   Patient has a history of GERD and takes Prevacid twice daily.  She has  had no black stools at home.  Bowel movements are unchanged.  The  patient is able to pass gas.  No fevers.  No weight loss, although her  appetite is not good.   The patient's upper abdominal pain is predominantly in the left upper  quadrant but does span to the whole upper abdomen.  She does complain of  mid back pain, although the abdominal pain is not described as boring  straight through to the back.  The patient does have chronic back  problems and has had surgery on her back in the past.  Along with a CAT  scan, the patient also had lab work done in our office which included a  normal lipase, normal LFTs, except for mildly elevated alkaline  phosphatase of 140.  The patient's sed rate was normal at 17.  Renal  function was normal.  Her white count and hemoglobin and hematocrit were  normal as well.   Her last EGD with Dr. Marina Goodell was September, 2006.  It was done for  dysphagia.  The examination revealed esophagitis, hiatal hernia, and a  polyp in the body of the stomach.  Her last colonoscopy was with Dr.  Marina Goodell in February, 2003, and it was done for positive fecal occult blood  test.  Findings included sigmoid diverticulosis and internal  hemorrhoids.   PAST MEDICAL HISTORY:  1. Depression.  2. Obesity.  3. Fibromyalgia.  4. Diabetes.  5. Hypertension.  6. Osteoarthritis.  7. GERD.   PAST SURGICAL HISTORY:  1. Hysterectomy.  2. Appendectomy.  3. Back surgery.   FAMILY HISTORY:  Diabetes.   SOCIAL HISTORY:  Former tobacco use.  No alcohol use.  Patient lives  alone.  She has a  friendly relationship with her ex-husband, who was in  the room.   MEDICATIONS:  1. Asmanex inhaler once daily.  2. Metoprolol 50 mg 1/2 tablet daily.  3. Hydrochlorothiazide, dose needs to be determined.  4. Estradiol 2 mg daily.  5. Lipitor 20 mg daily.  6. Prevacid 30 mg b.i.d.  7. Meloxicam 7.5 mg daily.  8. Ultram 50 mg every 4 hours p.r.n.  9. NovoLog insulin 100 units t.i.d.  10.Humulin insulin 100 units t.i.d.  11.Vitamins.   ALLERGIES:  Multiple allergies include MRI DYE, GADOLINEUM, ATIVAN,  ZOLOFT, FLEXERIL, ACCOLATE, VERAPAMIL, GLUCOPHAGE, ADVANTA, NEURONTIN,  CYMBALTA, LEVAQUIN, FORTAMET, CODEINE, PHENERGAN, AND FENTANYL.   LABORATORY STUDIES:  Today's CBC on admission reveals a WBC of 3.8,  hemoglobin 14.4, hematocrit 40.7, platelets 187.   REVIEW OF SYSTEMS:  No weight loss.  No fevers.  No enlarged lymph  nodes.  No visual disturbances.  Positive nonproductive cough controlled  with inhaler.  No shortness of breath.  No chest pain.  Positive nausea  without vomiting.  Positive upper abdominal pain, as described  in the  HPI.  No change in bowel habits.  Positive frequency of urination with  hyperglycemia.  No dysuria.  No gross hematuria.  Positive right lower  extremity swelling.  No lower extremity pain.  No dizziness.  No skin  rashes or lesions.   PHYSICAL EXAMINATION:  Temp 97.8, pulse 92, respirations 20, blood  pressure 130/82, O2 saturation 96% on room air.  Ms. Recktenwald is a pleasant 70 year old female lying in bed in no acute  distress.  HEENT:  No scleral icterus.  NECK:  Supple.  No palpable lymphadenopathy.  CARDIAC:  Regular rate and rhythm.  RESPIRATORY:  Bilateral lung fields clear.  GI:  Normoactive bowel sounds.  ABDOMEN:  Soft with mild diffuse tenderness.  Tenderness is greatest in  the right upper quadrant.  No masses.  Patient has normoactive bowel  sounds.  Abdomen is obese.  EXTREMITIES:  Trace right lower extremity edema.  PSYCHOLOGICAL:  Pleasant, alert and oriented x3.   IMPRESSION:  1. Progressive nausea, upper abdominal pain, bloating in the setting      of a CAT scan revealing prominent wall thickness of the distal      jejunum.  Doubt infectious etiology.  Question nonsteroidal-induced      enteropathy.  Doubt ischemia.  2. Multiple medication intolerances/allergies.  3. History of gastroesophageal reflux, on twice daily proton pump      inhibitors.  4. Hypertension.  5. Dyslipidemia.  6. Diabetes.  7. Obesity.  8. Chronic cough.  9. Fibromyalgia.   PLAN:  1. Will admit the patient for IV fluids, analgesics, antiemetics, and      further workup.  2. Clear-liquid diabetic diet.  3. Accu-Cheks for sliding scale insulin coverage.  4. Schedule a.m. small bowel enteroscopy.  5. Continue PPI.  6. Continue home medications except NSAIDs.      Willette Cluster, NP      Hedwig Morton. Juanda Chance, MD  Electronically Signed    PG/MEDQ  D:  09/12/2007  T:  09/12/2007  Job:  [id number]

## 2010-05-19 NOTE — Op Note (Signed)
NAME:  Katherine Carr, Katherine Carr            ACCOUNT NO.:  [id number]   MEDICAL RECORD NO.:  [id number]          PATIENT TYPE:  INP   LOCATION:  5019                         FACILITY:  [facility]   PHYSICIAN:  Mark C. Ophelia Charter, M.D.    DATE OF BIRTH:  07-03-1940   DATE OF PROCEDURE:  09/26/2005  DATE OF DISCHARGE:  09/28/2005                                 OPERATIVE REPORT   PREOPERATIVE DIAGNOSIS:  L5 stenosis.   POSTOPERATIVE DIAGNOSIS:  L5 stenosis.   PROCEDURE PERFORMED:  Central decompression, L5-S1.   SURGEON:  Mark C. Ophelia Charter, M.D.   ASSISTANT:  Maud Deed, P.A.-C.   ANESTHESIA:  General endotracheal tube.   ESTIMATED BLOOD LOSS:  Less than 100 mL.   DRAINS:  None.   COMPLICATIONS:  None.   This patient has stenosis with primarily posterior laminar compression with  hypertrophic ligamentum and some mild foraminal stenosis with neurogenic  claudication symptoms.  She has failed conservative treatment including  injections.  She lives alone.   DESCRIPTION OF PROCEDURE:  After induction of general anesthesia orotracheal  intubation, the patient was placed in the prone position.  Standard prep and  drape was performed.  The back was squared with towels.  Needle localization  with a cross-table lateral x-ray and a needle was performed and Betadine and  bio drape were then applied, and a laminectomy sheet and drape.  An incision  was made in a midline initial approach after needle localization was  performed, and the lamina was removed partially with a small keyhole.  Disk  appeared flat and was not prominent.  A second x-ray was taken, which showed  the exposure a little bit high, and progression down to the L5-S1 level was  performed with central decompression at L5-S1, removing thick chunks of  ligament and overhanging portions of facet.  No diskectomy was performed,  and once central decompression was performed, with  no areas of remaining stenosis, the disk was checked on both  sides.  The  operative microscope was used for visualization with decompression.  The  operative field was irrigated.  The deep fascia was closed with 0 Vicryl, 2-  0 in subcutaneous tissue, and then skin closure with subcuticular.  Instrument count and needle count was correct.      Mark C. Ophelia Charter, M.D.  Electronically Signed     MCY/MEDQ  D:  10/23/2005  T:  10/24/2005  Job:  [id number]

## 2010-05-19 NOTE — Discharge Summary (Signed)
NAME:  Katherine Carr, Katherine Carr                      ACCOUNT NO.:  [id number]   MEDICAL RECORD NO.:  [id number]                   PATIENT TYPE:  INP   LOCATION:  0345                                 FACILITY:  [facility]   PHYSICIAN:  Katherine Carr, M.D. [facility]             DATE OF BIRTH:  1940/11/06   DATE OF ADMISSION:  09/01/2001  DATE OF DISCHARGE:  09/06/2001                                 DISCHARGE SUMMARY   ADMISSION DIAGNOSES:  43. A 70 year old white female with suspected gastroenteritis.  2. Migraine headache, acute.  3. A 14 cm irregular low density liver lesion, etiology not clear.  4. Insulin-dependent diabetes mellitus.  5. Obesity.  6. Gastroesophageal reflux disease and hiatal hernia.  7. Degenerative disc disease of the spine.  8. History of fatty liver.  9. Asthma.  10.      Fibromyalgia.  11.      Chronic early satiety suspect gastroparesis.   DISCHARGE DIAGNOSES:  1. Resolving acute gastroenteritis with nausea and vomiting, diarrhea and     abdominal discomfort.  2. Migraine headache, acute, exacerbated by above.  3. Diabetic gastroparesis exacerbated by acute illness.  4. Large low density liver lesion consistent with focal fat on MRI.  5. Insulin-dependent diabetes mellitus.  6. Obesity.  7. Gastroesophageal reflux disease and hiatal hernia.  8. Degenerative disc disease of the spine.  9. History of fatty liver.  10.      Asthma.  11.      Fibromyalgia.  12.      Chronic early satiety suspect gastroparesis.   CONSULTATION:  Internal medicine, Dr. Felicity Carr.   BRIEF HISTORY:  The patient is a pleasant 70 year old white female primary  patient of Dr. Clent Carr known to Dr. Yancey Carr in the past with multiple medical  problems as outlined above, disabled secondary to chronic back pain and  fibromyalgia.  She presented with acute nausea, vomiting, and diarrhea x1  week initially having diarrhea and cramping which resolved and then the  patient feeling better, had eaten a  hamburger and very quickly became ill  again with nausea, vomiting, and diarrhea.  She was unable to keep down any  p.o. and presented to the emergency room where she was seen and evaluated,  given Zofran and some narcotic for pain, eventually Imitrex for headache and  continued to complain of abdominal discomfort.  CT scan was done showing a  large 14 cm irregular hepatic lesion in the mid and left hepatic lobe.  No  IV contrast was used due to IV contrast allergy.  Because of this abnormal  finding and persistent complaints of nausea, vomiting, etc., she was seen by  GI and admitted to the hospital for further diagnostic evaluation and  medical management.    LABORATORY DATA ON ADMISSION:  Urinalysis with specific gravity 1.04, glucose greater than 1000, ketones  greater than 80, leukocyte esterase negative.  Amylase 30.  Sodium 130,  glucose 233, BUN 16, creatinine 0.9.  Albumin 3.5, SGOT 39, SGPT 45,  alkaline phosphatase 147, total bilirubin 1.0.  WBC 5.7, hemoglobin 15.5,  hematocrit 43.7, platelets 236.  Lipase 19.  Follow-up on September 02, 2001,  liver function studies entirely normal.  Albumin 2.9,, potassium 3.3.  Coagulations within normal limits.  Alpha fetoprotein normal at 4.4.  Hemoglobin A1C was 8.1 on September 05, 2001.   CT scan of the abdomen and pelvis as outlined above, concerning for a large  area of irregular low attenuation in the anterior half of the liver up to  14.4 cm and 8 cm thick.  Could not rule out large tumor abscess or hepatic  infarct.  Appeared atypical for geographic fatty infiltration, otherwise  negative study.  Mild diverticulosis of the sigmoid colon.  MRI of the  abdomen without contrast on September 02, 2001, showed a large area of focal  fat within the medial left and anterior right hepatic lobes, no suspicious  mass demonstrated.  No other significant findings.  KUB on September 04, 2001, fluid filled dilated stomach.  Ultrasound of the  upper abdomen on  September 05, 2001, showed gallbladder normal, no gallstones, abnormal liver  as previous, spleen mildly enlarged at 14 cm.  Gastric emptying scan on  September 05, 2001, showed 50 to 60% retention at two hours.   HOSPITAL COURSE:  The patient was admitted to the service of Iva Boop, M.D., who was covering the hospital.  She was initially started on  IV fluids, sliding scale insulin coverage, clear liquid diet, and maintained  on most of her home medications.  She was given Zofran for nausea and  Demerol as needed for pain.  The following morning, she was complaining of a  severe migraine, was quite nauseated and actually vomiting because of this.  MRI had to be delayed until her migraine could be adequately treated.  From  the standpoint of her gastroenteritis, she seemed to be improved with no  further diarrhea.  MRI was eventually obtained with findings as outlined  above.  She continued to complain of nausea .  We attempted to try IV  Reglan, however, she reported an intolerance to Reglan with dyskinesia in  the past.  On September 03, 2001, she was also complaining of neck spasms.  By September 04, 2001, again had a migraine, nausea and dry heaves, an  episode of diarrhea which did not recur.  Due to a multitude of her symptoms  and inability to treat them symptomatically, we asked internal medicine to  see her for any other recommendations.  They felt that she also had a  gastroenteritis.  They increased her migraine prophylaxis, Inderal, to 240  mg, continued oral Imitrex as needed.  The patient did fine Fioricet  helpful.  Finally, by September 5,2003, she was feeling better, though not  ready for discharge.  We did obtain KUB, ultrasound and gastric emptying  scan on September 04, 2001, as well, pertinent for findings of gastroparesis  which were suspected.  We gave her a trial of Zelnorm 6 mg b.i.d. to see if this would help her gastroparetic symptoms.  By  September 06, 2001, she was  stable and discharged to home with instructions to follow up with Dr. Clent Carr in  one to two weeks and to follow up with GI on a p.r.n. basis.  She was to  maintain a diabetic, low residue diet with five or six small meals per day.  MEDICATIONS:  1. Prevacid 30 mg p.o. q.d.  2. Zelnorm 6 mg p.o. b.i.d.  3. Imitrex 50 mg as previous p.r.n.  4. Propranolol 240 mg q.d.  5. Estrace as previous.  6. Inhalers as previous.  7. Glucotrol XL two tablets at bedtime as previous.  8. Singulair 10 mg q.d.  9.     Zyrtec 10 mg q.d.  10.      Insulin 40 units of 70/30 q.a.m. and 40 units q.p.m.   CONDITION ON DISCHARGE:  Stable and improved.      Amy Esterwood, P.A.-C. [facility]                John N. Marina Carr, M.D. [facility]    AE/MEDQ  D:  09/08/2001  T:  09/08/2001  Job:  [id number]

## 2010-05-19 NOTE — Discharge Summary (Signed)
NAME:  Katherine Carr, Katherine Carr NO.:  [id number]   MEDICAL RECORD NO.:  [id number]          PATIENT TYPE:  INP   LOCATION:  5713                         FACILITY:  [facility]   PHYSICIAN:  Margaretmary Bayley, M.D.    DATE OF BIRTH:  1940/01/23   DATE OF ADMISSION:  08/05/2003  DATE OF DISCHARGE:  08/12/2003                                 DISCHARGE SUMMARY   DISCHARGE DIAGNOSES:  1.  Poorly controlled insulin-dependent diabetes mellitus.  2.  Significant insulin resistance.  3.  Exogenous obesity.  4.  History of asthmatic bronchitis.  5.  History of esophageal reflux disease.   REASON FOR ADMISSION:  Katherine Carr is a 71 year old Caucasian female I  had been seeing in consultation in the office for the initial time about two  weeks prior to admission with poorly controlled diabetes with increasing  amounts of Lantus and Humalog insulin without significant improvement in her  diabetic control.  The patient stated that in association with her  increasing insulin dosage, she had been gaining significant weight and  noting increasing fatigue, shortness of breath, but denied any symptoms of  hypoglycemia because of the patient's increasing requirements of insulin  with progressively increasing fasting blood sugars.  It was felt that the  patient needed to have an acute assessment of her diabetic treatment to be  sure that she was not having chronic __________ reactions related to  nocturnal hypoglycemia.   Pertinent work-up during her hospitalization included an EKG that revealed a  normal sinus rhythm and basically a normal EKG.  She had a 2-D echo that  revealed overall normal left ventricular function, ejection fraction of  between 55 and 65%.  No diagnostic left ventricular wall motion  abnormalities were noted and she had no evidence to suggest any significant  valvular abnormalities.  Admission white count is 7400, hematocrit 46.  Sed  rate is normal at 11.  She had a  normal coag profile.  Her admission glucose  is 357 with a CO2 of 22.  Sodium slightly low at 133.  Her creatinine is  normal at 0.8 with a normal albumin of 0.7.  However, she did have  elevations in her transaminases at 43 and 52 with an alkaline phosphatase of  172.  Her glycosylated hemoglobin is elevated at 8.6 and she had normal  thyroid function studies.  This included a TSH.   HOSPITAL COURSE:  The patient is admitted and started on a fairly  conservative fractional schedule of NovoLog prior to meals along with a  Lantus dose which was changed from a once a day schedule to 15 units every  12 hours.  Her hydrochlorothiazide was continued at 25 mg a day along with  propranolol, her Lipitor, Clarinex and her Pulmicort.  A cortisol level was  obtained to exclude the possibility of Cushing's syndrome in view of her  increasing insulin resistance and was found to be in fact low at 2.  A  subsequent simultaneous ACTH and cortisol level was found to be low and it  was our feeling that the patient's chronic steroid use for her  asthma was  probably contributing.  With a reduction in her insulin dosages and  obtaining 2:30 a.m. blood sugars there was o evidence that the patient was  having nocturnal hypoglycemia.  Her Lantus dose was increased progressively  continuing to monitor her nocturnal blood sugars without any evidence to  suggest that the patient's increasing insulin resistance was related to  nocturnal hypoglycemia.  She had an appropriate adjustment in her fractional  schedule as well with NovoLog.  Lantus dose was progressively increased to  35 units every 12 hours.  A pulmonary consultation was obtained because of a  chest x-ray that raised a question of a right-sided chest lesion.  It was  the feeling by pulmonary that this was most likely a nonspecific finding and  did not represent anything  progressive and pathologic, however, it was felt  that this should probably be  evaluated again in six months with a CT scan to  be reassured.  The patient's elevated LFTs prompted [place] to suggest that the  patient should discontinue her statin dose until she could be reevaluated by  her primary care physician and cardiologist.  She is subsequently discharged  home in a condition that is stable and improved.  Her hospitalization  reassured the patient that in fact she could have well controlled diabetes  with adherence to a diet and a bit of flexibility in the use of her insulin.  She is scheduled to be seen in the diabetes and nutritional management  clinic within two weeks of her discharge.  She was advised to stop her  propranolol and to start Coreg or Toprol XL 50 mg twice a day.  Zelnorm 2 mg  before meals and at bedtime.  Prevacid 15 mg twice a day for her  gastroesophageal reflux disease.  She is advised to use 40 units of Lantus  insulin every 12 hours twice a day.  Avandia 2 mg once a day.  NovoLog 70/30  50 units before supper and a fractional schedule based on her blood sugars  each morning.   She is discharged home on a 1500 calorie, carbohydrate modified, fat  restricted, 3 g sodium diet.  She is advised to check her blood sugars  before meals and at bedtime with the results being brought in with her on  the first office visit which should be in two to three weeks.       PC/MEDQ  D:  09/30/2003  T:  09/30/2003  Job:  [id number]

## 2010-05-19 NOTE — Op Note (Signed)
Katherine Carr, JEANE            ACCOUNT NO.:  [id number]   MEDICAL RECORD NO.:  [id number]          PATIENT TYPE:  INP   LOCATION:  3040                         FACILITY:  [facility]   PHYSICIAN:  Stefani Dama, M.D.  DATE OF BIRTH:  01/10/1940   DATE OF PROCEDURE:  09/14/2004  DATE OF DISCHARGE:                                 OPERATIVE REPORT   PREOPERATIVE DIAGNOSIS:  Cervical spondylosis with myelopathy.   POSTOPERATIVE DIAGNOSIS:  Cervical spondylosis with myelopathy.   PROCEDURE:  Anterior cervical decompression, arthrodesis with structure  allograft C3, C4.  Alphatec plate fixation.   SURGEON:  Stefani Dama, M.D.   FIRST ASSISTANT:  Clydene Fake, M.D.   ANESTHESIA:  General endotracheal anesthesia.   INDICATIONS FOR PROCEDURE:  Ms. Kerekes is a 70 year old individual who  has had significant neck, shoulder and arm pain with dysesthesias into both  upper extremities and weakness.  She has a positive Spurling's maneuver and  has experienced Lhermitte's phenomenon throughout the neck, shoulders and  arms.  She has evidence of a significant disc herniation at the C3-C4 level  with spinal cord compression.   DESCRIPTION OF PROCEDURE:  Patient was brought to the operating room and  placed on the table in the supine position.  After smooth induction of  general endotracheal anesthesia, she was placed in five pounds of Holter  traction. The neck was prepped with DuraPrep and draped in sterile fashion.  A transverse incision was made in the left side of the neck and carried down  through the platysma.  Plane between the sternocleidomastoid and the strap  muscle was dissected bluntly until the first prevertebral space was reached.  This was identified as C5-C6 on initial radiograph and then C3-C4 was  uncovered and localized.  The patient then underwent anterior discectomy  using a 15 blade to open up to the anterior longitudinal ligament.  This was  noted to be  severely degenerated and desiccated.  It was removed using a  combination of Kerrison rongeurs, self-retaining retractor was placed into  the disc space and the disc was further removed to the posterior  longitudinal ligament.  The ligament was opened up partially to expose the  edges of the common dural tube.  The central canal was decompressed and  removing inferior osteophytes from the body of C3, superior osteophytes from  body of C4.  In the end, a good decompression was obtained.  Then an 8 mm  transgraft was shaved to the appropriate size an shape.  It was filled with  demineralized bone matrix and then placed into the interspace.  Traction was  removed.  A 16 mm standard size Alphatec plate was fitted with fixed angle  screws in the C4 vertebrae, variable angle screws in the C3 vertebra and  this was locked into position.  Care  was taken to make sure that there was adequate hemostasis in all the  surrounding tissues and then after localizing radiograph identified good  position of the hardware, the platysma was closed with 3-0 Vicryl in  interrupted fashion and 3-0 Vicryl was used to close the  subcuticular skin.  The patient tolerated the procedure well.      Stefani Dama, M.D.  Electronically Signed     HJE/MEDQ  D:  09/14/2004  T:  09/15/2004  Job:  [id number]

## 2010-05-19 NOTE — Discharge Summary (Signed)
NAMEELENORA, Katherine Carr            ACCOUNT NO.:  [id number]   MEDICAL RECORD NO.:  [id number]          PATIENT TYPE:  INP   LOCATION:  3040                         FACILITY:  [facility]   PHYSICIAN:  Stefani Dama, M.D.  DATE OF BIRTH:  February 04, 1940   DATE OF ADMISSION:  09/14/2004  DATE OF DISCHARGE:  09/16/2004                                 DISCHARGE SUMMARY   ADMISSION DIAGNOSES:  1.  Cervical spondylosis with myelopathy C3-C4.  2.  Diabetes mellitus.   DISCHARGE/FINAL DIAGNOSES:  1.  Cervical spondylosis with myelopathy C3-C4.  2.  Diabetes mellitus.   CONDITION ON DISCHARGE:  Stable.   HOSPITAL COURSE:  [name] is a 70 year old individual who has  had dysesthesias and weakness in the upper extremities secondary to  spondylitic herniation of a disk at L3-L4, causing spinal cord compression  and myelopathy. She was advised regarding surgical decompression. She was  admitted to the hospital. She does have an underlying history of diabetes  and is on insulin for treatment of this process.  She underwent the anterior  cervical decompression arthrodesis and did well from the surgery during the  postoperative period.  She did have significant elevations in her blood  sugars to 350 and 360 and required additional insulin coverage to bring her  blood sugars under control.  With the current time, she is back on her  regular diet. Her blood sugars are under control nicely.   DISCHARGE MEDICATIONS:  She is being discharged home on her regular  medications in addition to some Percocet #40 without refills for pain and  Valium 5 mg, #30 without refills for muscle spasm.   FOLLOW UP:  She will be seen in the office in three weeks time.   She notes that there is some fullness in her throat with swallowing but she  is able to tolerate a regular diet and take liquids without difficulty.   CONDITION ON DISCHARGE:  Stable.      Stefani Dama, M.D.  Electronically  Signed     HJE/MEDQ  D:  09/15/2004  T:  09/15/2004  Job:  [id number]

## 2010-05-19 NOTE — H&P (Signed)
NAME:  Katherine Carr, Katherine Carr NO.:  [id number]   MEDICAL RECORD NO.:  [id number]                   PATIENT TYPE:  INP   LOCATION:  0345                                 FACILITY:  [facility]   PHYSICIAN:  Iva Boop, M.D. [facility]           DATE OF BIRTH:  01-28-1940   DATE OF ADMISSION:  09/01/2001  DATE OF DISCHARGE:                                HISTORY & PHYSICAL   CHIEF COMPLAINT:  Nausea, vomiting, and diarrhea.   HISTORY OF PRESENT ILLNESS:  This is a 70 year old married disabled white  female who was in her usual state of health with multiple chronic medical  conditions approximately one week ago.  Then she developed diarrhea and some  crampy abdominal pain for about two or three days.  This resolved.  Then  yesterday she went out to eat and had a hamburger, and about 20 minutes  later developed sudden onset of nausea, vomiting, and diarrhea, all night  and all day subsequent to that.  It was actually two days ago when this  started.  She could not hold anything down, and she presented to the  emergency department tonight where she was evaluated and treated with Zofran  and some morphine, and eventually some Imitrex for her headaches, as she has  migraine headaches, and finally feels well enough to take some sips of  liquids and crackers.  She is describing multiple sites of abdominal pain  off and on.  She noted that her stools became yellow before this started.  In the emergency department, CT scan was performed because of her problems,  and it demonstrated a large 14 cm irregular liver lesion in the mid and left  hepatic lobe region.  IV contrast could not be used due to an IV contrast  allergy.  The patient has not known of any history of liver problems other  than a diagnosis of fatty liver in the past.  She has had an abnormal  mammogram in 2001, with a right anterior upper outer breast lesion noted,  ultrasound was negative, followup has not been  obtained at this point.  She  denies any nodules other than chronic fibrocystic changes in the breasts.  She has had subjective fevers and night sweats for a year she says.  She has  not taken her temperature, however, with this.  I do not know if there has  been any significant weight changes, though her blood sugars have been  poorly controlled of late.  Nobody at home was sick in a similar way.  She  has had no hematemesis or bleeding.  She had some chronic intermittent  dysphagia.  She underwent a gastrointestinal workup by Dr. Yancey Flemings in  2/03, where she had evaluation of her dysphagia where she had a hiatal  hernia noted and some gastritis, and was felt to have chronic  gastroesophageal reflux disease.  She still has some intermittent solid food  dysphagia.  She had a colonoscopy which demonstrated diverticulosis in the  sigmoid colon and some internal hemorrhoids.  At that time, she was  complaining of left upper quadrant pain, but she had an acute abdominal pain  episode followed by rectal bleeding.  She also has complained of early  sciatiaty and intermittent dysphagia at that time, which has been a  persistent problem for her.  He thought that she probably had some  gastroparesis.   ALLERGIES:  PHENERGAN, REGLAN, both of these causing jerking type movements.  METOCLOPRAMIDE, CODEINE, IV CONTRAST.  She had pharyngeal edema during a CT  of the brain a number of years ago.   REVIEW OF SYMPTOMS:  She had tiny nodules appearing in the subcutaneous  areas of her arms over the past year.  She noted skin tags increasing in  which she says are moles on her body.   MEDICATIONS:  1. Insulin 70/30 40 units b.i.d.  2. Glucotrol 20 mg at bedtime.  3. Prevacid 30 mg q.d.  4. Propanolol 160 mg q.d. for migraines.  5. Singular 10 mg q.d.  6. Zyrtec 10 mg q.d.  7. Furosemide 20 mg p.r.n.  8. Proventil 2 puffs b.i.d.  9. Rhinocort nasal spray b.i.d.  10.      Some other pulmonary  medicine that starts with a R that she cannot     remember the name of.   PAST MEDICAL HISTORY:  1. Diabetes mellitus.  She has been on insulin for a number of years for her     diabetes.  2. Obesity.  3. Fibromyalgia.  4. Degenerative disk disease with spondylolisthesis, L4-5.  5. Migraine headaches.  6. Asthma.  7. Chronic reflux symptoms.  8. Allergies.  9. Chronic pain with the fibromyalgia, which is likely disability.  10.      Status post hysterectomy and cesarean section in the past.   FAMILY HISTORY:  No liver disease or colon cancer reported.   SOCIAL HISTORY:  Disabled, was owner of a beauty shop for about 30 years.  No alcohol, no tobacco, married.   REVIEW OF SYMPTOMS:  Comprehensive review of systems, in addition to those  things mentioned above, she has right-sided tenderness.  She has pain in  both left and right inframammary areas, more on the left.  She has had  urinary incontinence, pedal edema.  She has eye troubles, but no  retinopathy.  She has chronic burning paresthesia like symptoms of the feet.  She has chronic back pain.  She has had some red papules come and go on her  skin.  All other systems appear negative at this time.   PHYSICAL EXAMINATION:  GENERAL:  An obese pleasant white female in mild  distress at this time.  VITAL SIGNS:  Blood pressure 136/81, pulse 72, respiratory rate 20,  temperature 97.1 on presentation.  HEENT:  The eyes are anicteric.  Pupils equal, round, reactive to light.  Mouth:  Posterior pharynx, mucosa somewhat dry, but free of lesions.  NECK:  Supple, without mass or thyromegaly.  The neck is large, and  examination is limited by that.  CHEST:  Clear.  HEART:  S1 and S2, no murmurs, rubs, or gallops.  Heart sounds are distant.  ABDOMEN:  Obese, moderately tender in the epigastrium.  There is no  organomegaly or mass appreciated, though size precludes that.  There is a low midline scar in the abdomen.  Bowel sounds are  present.  There is no  succussions  splash.  LYMPH NODES:  No neck, supraclavicular, or groin adenopathy.  No axillary  adenopathy.  EXTREMITIES:  There is trace pedal edema.  The extremities are warm and dry.  SKIN:  Somewhat pale in color, though there is no real pallor.  She has  multiple nevi, none of which are clearly suspicious.  There are multiple  skin tags which is what I think she was talking about as far as the moles  appearing.  There are some fine subcutaneous lesions in the upper  extremities of uncertain significance.  In addition, breast examination  performed in the presence of a nurse demonstrates some nodular densities  which are small and pea sized at best.  There is no dominant mass, there is  no nipple discharge, no axillary adenopathy.   LABORATORY DATA:  CBC shows a hemoglobin of 15.5, platelet count 236, white  count 5.7.  Sodium 130, glucose 233, alkaline phosphatase 147, SGOT 39, SGPT  45, otherwise her CMP is normal.  Amylase is normal at 30.  Her urinalysis  showed greater than 1.040 specific gravity, pH 5.5, glucose greater than  100, bilirubin small, ketones greater than 80, protein 100, urobilinogen  1.0.  There was some yeast in the urine.  Lipase is 19.  CT scanning as  mentioned above.   ASSESSMENT:  1. Nausea and vomiting, and diarrhea.  Mostly suspicious for     gastroenteritis, question link to this liver lesion.  2. Large liver lesion with irregular borders which I am concerned could     represent some form of cancer, more likely metastatic, consider lymphoma     versus other primary malignancy.  Further information is needed.  3. Diabetes mellitus.  4. Migraine headache which has improved with treatment in the emergency     department.  5. Dehydration.  6. Diabetes is somewhat out of control with glucose above 200.   PLAN:  1. Admit.  2. Hydrate.  3. Pain control.  4. Workup this liver lesion with MRI.  5. Just sips of clear and crackers  at this point.  6. We will try to continue her medications, and place her on her regular     insulin q.6h. with a sliding scale in addition for the time being.  7. We will check coag's in anticipation of biopsy of this lesion of the     liver.  8. We may need to extend the workup to search for a primary, but I think     since there is a liver lesion we probably ought to end up biopsying that     first, but I would proceed with a MRI for further characterization before     we proceed with any other procedure.   I appreciate the opportunity to care for this patient.                                                Iva Boop, M.D. [facility]    CEG/MEDQ  D:  09/01/2001  T:  09/02/2001  Job:  [id number]   cc:   Jeannett Senior A. Clent Ridges, M.D. [facility]

## 2010-08-10 ENCOUNTER — Encounter: Payer: Self-pay | Admitting: Vascular Surgery

## 2010-08-16 ENCOUNTER — Encounter: Payer: Self-pay | Admitting: Vascular Surgery

## 2010-08-16 ENCOUNTER — Ambulatory Visit (INDEPENDENT_AMBULATORY_CARE_PROVIDER_SITE_OTHER): Payer: Medicare Other | Admitting: Vascular Surgery

## 2010-08-16 VITALS — BP 114/71 | HR 59 | Resp 16 | Ht 64.0 in | Wt 161.0 lb

## 2010-08-16 DIAGNOSIS — M79604 Pain in right leg: Secondary | ICD-10-CM

## 2010-08-16 DIAGNOSIS — M79609 Pain in unspecified limb: Secondary | ICD-10-CM

## 2010-08-16 DIAGNOSIS — M79605 Pain in left leg: Secondary | ICD-10-CM

## 2010-08-16 NOTE — Progress Notes (Signed)
Subjective:     Patient ID: Katherine Carr, female   DOB: 1940-05-14, 70 y.o.   MRN: [id number]  HPI  This is a pleasant 70 year old woman referred by Dr. Eula Listen with bilateral leg pain. She states that the pain began approximately a year ago. She had been having problems with back pain and underwent lumbar spine surgery. She later developed pain in both legs that she describes as severe and constant. She states there are no aggravating or alleviating factors. Occasionally she feels the sensation of the electrical shock. She is nonambulatory. She lives in the assisted-living facility and is for the most part in a wheelchair.  She also has a history of cervical disc disease in addition to her lumbar disc disease.  Past Medical History  Diagnosis Date  . Depression   . Diabetes mellitus   . Hypertension   . Peripheral neuropathy   . Diverticulosis   . Urinary incontinence     Family History  Problem Relation Age of Onset  . Heart disease Mother   . Heart disease Brother   . Diabetes Brother     History  Substance Use Topics  . Smoking status: Former Smoker -- 20 years    Types: Cigarettes    Quit date: 08/15/1996  . Smokeless tobacco: Not on file   Comment: smoked 2-3 cigarettes/day  . Alcohol Use: No    Allergies  Allergen Reactions  . Codeine   . Cyclobenzaprine Hcl   . Cymbalta (Duloxetine Hcl)   . Duloxetine   . Fentanyl     REACTION: PANIC ATTACKS  . Furosemide     REACTION: face was swollen and hand  . Gabapentin   . Gadolinium Derivatives   . Hydrocodone-Acetaminophen   . Iohexol      Code: SOB, Desc: xray dye, Onset Date: [id number]   . Levofloxacin     REACTION: PANIC ATTACK,DIARRHEA,EYES DISCOLORD  . Lorazepam   . Lubiprostone     REACTION: REALLY BAD NAUSEA  . Metformin   . Metoclopramide   . Morphine     REACTION: itching, panic attacks  . Oxycodone   . Promethazine Hcl     REACTION: TRAMA ANDJITTERS IN MY BODY  . Rosiglitazone   .  Rosiglitazone Maleate   . Sertraline Hcl   . Verapamil   . Zoloft     Current Outpatient Prescriptions  Medication Sig Dispense Refill  . ALPRAZolam (XANAX) 0.5 MG tablet Take 0.5 mg by mouth 2 (two) times daily.       . bisacodyl (DULCOLAX) 10 MG suppository Place 10 mg rectally daily as needed.        . Cholecalciferol (VITAMIN D) 2000 UNITS CAPS Take 1 capsule by mouth daily.        Marland Kitchen escitalopram (LEXAPRO) 20 MG tablet Take 10 mg by mouth. Take 3 tablets in the AM      . gabapentin (NEURONTIN) 600 MG tablet Take 600 mg by mouth 3 (three) times daily.        Marland Kitchen glimepiride (AMARYL) 2 MG tablet Take 2 mg by mouth. Take 1 tablet daily at 12:00 pm       . HYDROcodone-acetaminophen (NORCO) 5-325 MG per tablet Take 1 tablet by mouth every 6 (six) hours as needed.        . insulin glargine (LANTUS) 100 UNIT/ML injection Inject 90 Units into the skin at bedtime.        . Insulin Regular Human (HUMULIN R U-500, CONCENTRATED, [place]) Inject into  the skin 3 (three) times daily. Take three times / day prn, per SS Insulin coverage      . lansoprazole (PREVACID) 30 MG capsule Take 30 mg by mouth daily.        Marland Kitchen lidocaine (LIDODERM) 5 % Place 1 patch onto the skin daily. Remove & Discard patch within 12 hours or as directed by MD       . metoprolol succinate (TOPROL-XL) 25 MG 24 hr tablet Take 25 mg by mouth daily.        . ClonazePAM (KLONOPIN PO) Take 0.2 mg by mouth 2 (two) times daily.        Marland Kitchen esomeprazole (NEXIUM) 40 MG capsule Take 40 mg by mouth daily before breakfast.        . estradiol (ESTRACE) 0.5 MG tablet Take 0.5 mg by mouth daily.         Review of Systems: ROS negative except for  Review of Systems  Constitutional: Positive for unexpected weight change. Negative for fever, chills and appetite change.  HENT: Positive for postnasal drip.   Respiratory: Negative for cough, chest tightness, shortness of breath and wheezing.   Cardiovascular: Positive for palpitations and leg swelling.  Negative for chest pain.  Gastrointestinal: Positive for abdominal distention. Negative for nausea, vomiting, diarrhea, constipation and blood in stool.  Genitourinary: Positive for flank pain. Negative for dysuria, frequency and hematuria.  Musculoskeletal: Positive for back pain, arthralgias and gait problem. Negative for myalgias.  Skin: Negative for rash and wound.  Neurological: Positive for tremors. Negative for dizziness, seizures, speech difficulty, weakness, numbness and headaches.  Hematological: Does not bruise/bleed easily.  Psychiatric/Behavioral: Positive for confusion and sleep disturbance. The patient is nervous/anxious.        Objective:   Physical Exam  Constitutional: She is oriented to person, place, and time. She appears well-developed and well-nourished.  HENT:  Head: Normocephalic and atraumatic.  Neck: Neck supple. No JVD present. No thyromegaly present.  Cardiovascular: Normal rate, regular rhythm and normal heart sounds.  Exam reveals no friction rub.   No murmur heard. Pulses:      Radial pulses are 2+ on the right side, and 2+ on the left side.       Femoral pulses are 2+ on the right side, and 2+ on the left side.      Popliteal pulses are 2+ on the right side, and 2+ on the left side.       Dorsalis pedis pulses are 2+ on the right side, and 2+ on the left side.  Pulmonary/Chest: Breath sounds normal. She has no wheezes. She has no rales.  Abdominal: Soft. Bowel sounds are normal. There is no tenderness.  Musculoskeletal: She exhibits no edema.  Lymphadenopathy:    She has no cervical adenopathy.  Neurological: She is alert and oriented to person, place, and time.  Skin: No rash noted.  Psychiatric: She has a normal mood and affect.   Doppler assessment Filed Vitals:   08/16/10 1204  BP: 114/71  Pulse: 59  Resp: 16    Body mass index is 27.64 kg/(m^2).  I performed a Doppler assessment in the office today which shows she has a biphasic right  dorsalis pedis and right posterior tibial signal. She has a bite biphasic left dorsalis pedis signal with a monophasic posterior tibial signal on the left.  I have reviewed her records from her admission in April of this year. She was admitted that time after a fall. She's had multiple falls  in the past and at this point she is now in the skilled nursing facility.  Reviewed her labs show hemoglobin A1c of 10.0 on 08/02/2010.     Assessment:     Based on her exam she does not have evidence of significant infrainguinal arterial occlusive disease. She has palpable dorsalis pedis pulses bilaterally. He has biphasic Doppler signals in both feet. Likewise she does not have evidence of significant venous insufficiency or swelling. I would agree with Dr. Rene Paci that she has pain mostly from neuropathy.    Plan:     I would not recommend any further invasive vascular workup at this time. I'll be happy to see her back at any time in the future if any new vascular issues arise.

## 2010-10-02 LAB — GLUCOSE, CAPILLARY
Glucose-Capillary: 207 — ABNORMAL HIGH
Glucose-Capillary: 248 — ABNORMAL HIGH
Glucose-Capillary: 261 — ABNORMAL HIGH
Glucose-Capillary: 269 — ABNORMAL HIGH
Glucose-Capillary: 309 — ABNORMAL HIGH

## 2010-10-04 LAB — CBC
HCT: 40.3
HCT: 40.7
Hemoglobin: 14.2
Hemoglobin: 14.4
MCHC: 35.4
MCV: 95
MCV: 95.7
RBC: 4.21
RDW: 13.6
WBC: 4.3

## 2010-10-04 LAB — COMPREHENSIVE METABOLIC PANEL
Alkaline Phosphatase: 112
BUN: 18
BUN: 8
CO2: 25
Calcium: 9
Chloride: 103
Creatinine, Ser: 0.44
GFR calc non Af Amer: >60
Glucose, Bld: 212 — ABNORMAL HIGH
Glucose, Bld: 454 — ABNORMAL HIGH
Potassium: 3.8
Total Bilirubin: 0.7
Total Protein: 6

## 2010-10-04 LAB — GLUCOSE, CAPILLARY
Glucose-Capillary: 180 — ABNORMAL HIGH
Glucose-Capillary: 198 — ABNORMAL HIGH
Glucose-Capillary: 203 — ABNORMAL HIGH
Glucose-Capillary: 251 — ABNORMAL HIGH
Glucose-Capillary: 270 — ABNORMAL HIGH

## 2010-10-04 LAB — RENAL FUNCTION PANEL
Albumin: 2.9 — ABNORMAL LOW
BUN: 11
Chloride: 105
GFR calc non Af Amer: >60
Potassium: 3.5
Sodium: 139

## 2010-10-04 LAB — DIFFERENTIAL
Basophils Relative: 1
Lymphs Abs: 1.2
Monocytes Relative: 7
Neutro Abs: 2.3
Neutrophils Relative %: 59

## 2010-10-04 LAB — TSH: TSH: 2.804

## 2010-10-04 LAB — RETICULOCYTES
RBC.: 4.41
Retic Ct Pct: 2.8

## 2010-10-17 ENCOUNTER — Emergency Department (HOSPITAL_COMMUNITY): Payer: Medicare Other

## 2010-10-17 ENCOUNTER — Emergency Department (HOSPITAL_COMMUNITY)
Admission: EM | Admit: 2010-10-17 | Discharge: 2010-10-17 | Disposition: A | Discharge status: Home / Self Care | Payer: Medicare Other | Attending: Emergency Medicine | Admitting: Emergency Medicine

## 2010-10-17 DIAGNOSIS — M542 Cervicalgia: Secondary | ICD-10-CM | POA: Insufficient documentation

## 2010-10-17 DIAGNOSIS — W050XXA Fall from non-moving wheelchair, initial encounter: Secondary | ICD-10-CM | POA: Insufficient documentation

## 2010-10-17 DIAGNOSIS — G8929 Other chronic pain: Secondary | ICD-10-CM | POA: Insufficient documentation

## 2010-10-17 DIAGNOSIS — Z79899 Other long term (current) drug therapy: Secondary | ICD-10-CM | POA: Insufficient documentation

## 2010-10-17 DIAGNOSIS — M545 Low back pain, unspecified: Secondary | ICD-10-CM | POA: Insufficient documentation

## 2010-10-17 DIAGNOSIS — S20229A Contusion of unspecified back wall of thorax, initial encounter: Secondary | ICD-10-CM | POA: Insufficient documentation

## 2010-10-17 DIAGNOSIS — F341 Dysthymic disorder: Secondary | ICD-10-CM | POA: Insufficient documentation

## 2010-10-17 DIAGNOSIS — E119 Type 2 diabetes mellitus without complications: Secondary | ICD-10-CM | POA: Insufficient documentation

## 2010-10-17 DIAGNOSIS — Z794 Long term (current) use of insulin: Secondary | ICD-10-CM | POA: Insufficient documentation

## 2010-10-17 DIAGNOSIS — M25559 Pain in unspecified hip: Secondary | ICD-10-CM | POA: Insufficient documentation

## 2010-10-17 DIAGNOSIS — S0003XA Contusion of scalp, initial encounter: Secondary | ICD-10-CM | POA: Insufficient documentation

## 2010-10-17 DIAGNOSIS — I10 Essential (primary) hypertension: Secondary | ICD-10-CM | POA: Insufficient documentation

## 2010-11-07 ENCOUNTER — Encounter (INDEPENDENT_AMBULATORY_CARE_PROVIDER_SITE_OTHER): Payer: Self-pay | Admitting: General Surgery

## 2010-11-08 ENCOUNTER — Encounter (INDEPENDENT_AMBULATORY_CARE_PROVIDER_SITE_OTHER): Payer: Self-pay | Admitting: General Surgery

## 2011-03-20 ENCOUNTER — Emergency Department (HOSPITAL_COMMUNITY)
Admission: EM | Admit: 2011-03-20 | Discharge: 2011-03-21 | Disposition: A | Discharge status: Skilled Nursing Facility | Payer: PRIVATE HEALTH INSURANCE | Attending: Emergency Medicine | Admitting: Emergency Medicine

## 2011-03-20 ENCOUNTER — Encounter (HOSPITAL_COMMUNITY): Payer: Self-pay | Admitting: *Deleted

## 2011-03-20 DIAGNOSIS — Z9181 History of falling: Secondary | ICD-10-CM | POA: Insufficient documentation

## 2011-03-20 DIAGNOSIS — Z794 Long term (current) use of insulin: Secondary | ICD-10-CM | POA: Insufficient documentation

## 2011-03-20 DIAGNOSIS — R739 Hyperglycemia, unspecified: Secondary | ICD-10-CM

## 2011-03-20 DIAGNOSIS — F329 Major depressive disorder, single episode, unspecified: Secondary | ICD-10-CM

## 2011-03-20 DIAGNOSIS — E119 Type 2 diabetes mellitus without complications: Secondary | ICD-10-CM | POA: Insufficient documentation

## 2011-03-20 DIAGNOSIS — R29898 Other symptoms and signs involving the musculoskeletal system: Secondary | ICD-10-CM | POA: Insufficient documentation

## 2011-03-20 DIAGNOSIS — F3289 Other specified depressive episodes: Secondary | ICD-10-CM | POA: Insufficient documentation

## 2011-03-20 DIAGNOSIS — I10 Essential (primary) hypertension: Secondary | ICD-10-CM | POA: Insufficient documentation

## 2011-03-20 LAB — COMPREHENSIVE METABOLIC PANEL
CO2: 29 mEq/L (ref 19–32)
Calcium: 9.3 mg/dL (ref 8.4–10.5)
Creatinine, Ser: 0.53 mg/dL (ref 0.50–1.10)
GFR calc Af Amer: >90 mL/min (ref >90)
GFR calc non Af Amer: >90 mL/min (ref >90)
Glucose, Bld: 610 mg/dL (ref 70–99)

## 2011-03-20 LAB — CBC
Hemoglobin: 15.6 g/dL — ABNORMAL HIGH (ref 12.0–15.0)
MCH: 31.9 pg (ref 26.0–34.0)
MCV: 90.2 fL (ref 78.0–100.0)
Platelets: 196 10*3/uL (ref 150–400)
RBC: 4.89 MIL/uL (ref 3.87–5.11)

## 2011-03-20 LAB — RAPID URINE DRUG SCREEN, HOSP PERFORMED
Cocaine: NOT DETECTED
Opiates: POSITIVE — AB

## 2011-03-20 LAB — URINALYSIS, ROUTINE W REFLEX MICROSCOPIC
Bilirubin Urine: NEGATIVE
Hgb urine dipstick: NEGATIVE
Nitrite: NEGATIVE
Specific Gravity, Urine: 1.04 — ABNORMAL HIGH (ref 1.005–1.030)
pH: 5 (ref 5.0–8.0)

## 2011-03-20 LAB — URINE MICROSCOPIC-ADD ON

## 2011-03-20 LAB — GLUCOSE, CAPILLARY: Glucose-Capillary: 388 mg/dL — ABNORMAL HIGH (ref 70–99)

## 2011-03-20 LAB — ETHANOL: Alcohol, Ethyl (B): <11 mg/dL (ref 0–11)

## 2011-03-20 MED ORDER — SODIUM CHLORIDE 0.9 % IV SOLN
INTRAVENOUS | Status: DC
  Administered 2011-03-20: 21:00:00 via INTRAVENOUS

## 2011-03-20 MED ORDER — GABAPENTIN 600 MG PO TABS: 600.0000 mg | ORAL_TABLET | Freq: Three times a day (TID) | ORAL | Status: DC

## 2011-03-20 MED ORDER — GABAPENTIN 300 MG PO CAPS
600.0000 mg | ORAL_CAPSULE | Freq: Three times a day (TID) | ORAL | Status: DC
  Administered 2011-03-20: 600 mg via ORAL
  Filled 2011-03-20 (×4): qty 2

## 2011-03-20 MED ORDER — INSULIN ASPART 100 UNIT/ML ~~LOC~~ SOLN
8.0000 [IU] | Freq: Once | SUBCUTANEOUS | Status: AC
  Administered 2011-03-20: 8 [IU] via SUBCUTANEOUS
  Filled 2011-03-20: qty 1

## 2011-03-20 MED ORDER — INSULIN GLARGINE 100 UNIT/ML ~~LOC~~ SOLN
65.0000 [IU] | Freq: Two times a day (BID) | SUBCUTANEOUS | Status: DC
  Administered 2011-03-20: 65 [IU] via SUBCUTANEOUS
  Filled 2011-03-20: qty 1

## 2011-03-20 MED ORDER — INSULIN REGULAR HUMAN 100 UNIT/ML IJ SOLN: 8.0000 [IU] | Freq: Once | INTRAMUSCULAR | Status: DC

## 2011-03-20 MED ORDER — ESTRADIOL 1 MG PO TABS
0.5000 mg | ORAL_TABLET | Freq: Every day | ORAL | Status: DC
  Filled 2011-03-20: qty 0.5

## 2011-03-20 MED ORDER — GLIMEPIRIDE 4 MG PO TABS
4.0000 mg | ORAL_TABLET | Freq: Every day | ORAL | Status: DC
  Filled 2011-03-20: qty 1

## 2011-03-20 MED ORDER — ALPRAZOLAM 0.5 MG PO TABS
0.5000 mg | ORAL_TABLET | Freq: Two times a day (BID) | ORAL | Status: DC
  Administered 2011-03-20: 0.5 mg via ORAL
  Filled 2011-03-20: qty 1

## 2011-03-20 MED ORDER — LIDOCAINE 5 % EX PTCH
1.0000 | MEDICATED_PATCH | CUTANEOUS | Status: DC
  Filled 2011-03-20: qty 1

## 2011-03-20 MED ORDER — TIOTROPIUM BROMIDE MONOHYDRATE 18 MCG IN CAPS
18.0000 ug | ORAL_CAPSULE | Freq: Every day | RESPIRATORY_TRACT | Status: DC
  Filled 2011-03-20: qty 5

## 2011-03-20 MED ORDER — ONDANSETRON HCL 4 MG PO TABS
4.0000 mg | ORAL_TABLET | Freq: Three times a day (TID) | ORAL | Status: DC | PRN
  Administered 2011-03-20: 4 mg via ORAL
  Filled 2011-03-20: qty 1

## 2011-03-20 MED ORDER — ALUM & MAG HYDROXIDE-SIMETH 200-200-20 MG/5ML PO SUSP: 30.0000 mL | ORAL | Status: DC | PRN

## 2011-03-20 MED ORDER — HYDROCODONE-ACETAMINOPHEN 5-325 MG PO TABS
1.0000 | ORAL_TABLET | Freq: Four times a day (QID) | ORAL | Status: DC | PRN
  Administered 2011-03-20 – 2011-03-21 (×2): 1 via ORAL
  Filled 2011-03-20 (×2): qty 1

## 2011-03-20 MED ORDER — INSULIN ASPART 100 UNIT/ML ~~LOC~~ SOLN
10.0000 [IU] | Freq: Once | SUBCUTANEOUS | Status: AC
  Administered 2011-03-20: 10 [IU] via SUBCUTANEOUS
  Filled 2011-03-20: qty 1

## 2011-03-20 MED ORDER — FLUTICASONE-SALMETEROL 250-50 MCG/DOSE IN AEPB
1.0000 | INHALATION_SPRAY | Freq: Two times a day (BID) | RESPIRATORY_TRACT | Status: DC
  Administered 2011-03-20: 1 via RESPIRATORY_TRACT
  Filled 2011-03-20: qty 14

## 2011-03-20 MED ORDER — ESTRADIOL 2 MG PO TABS: 10.5000 mg | ORAL_TABLET | Freq: Every day | ORAL | Status: DC

## 2011-03-20 MED ORDER — METOPROLOL SUCCINATE ER 25 MG PO TB24
25.0000 mg | ORAL_TABLET | Freq: Every day | ORAL | Status: DC
  Filled 2011-03-20: qty 1

## 2011-03-20 MED ORDER — ZOLPIDEM TARTRATE 5 MG PO TABS
5.0000 mg | ORAL_TABLET | Freq: Every day | ORAL | Status: DC
  Administered 2011-03-20: 5 mg via ORAL
  Filled 2011-03-20: qty 1

## 2011-03-20 MED ORDER — INSULIN REGULAR HUMAN 100 UNIT/ML IJ SOLN: 10.0000 [IU] | Freq: Once | INTRAMUSCULAR | Status: DC

## 2011-03-20 MED ORDER — SODIUM CHLORIDE 0.9 % IV BOLUS (SEPSIS)
500.0000 mL | INTRAVENOUS | Status: AC
  Administered 2011-03-20: 500 mL via INTRAVENOUS

## 2011-03-20 MED ORDER — DULOXETINE HCL 60 MG PO CPEP
60.0000 mg | ORAL_CAPSULE | Freq: Two times a day (BID) | ORAL | Status: DC
  Administered 2011-03-20: 60 mg via ORAL
  Filled 2011-03-20 (×3): qty 1
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
## 2011-03-20 NOTE — ED Notes (Signed)
Bed:WHALA<BR> Expected date:<BR> Expected time:<BR> Means of arrival:<BR> Comments:<BR> Hold for triage 4

## 2011-03-20 NOTE — ED Notes (Addendum)
Pt states she is wheelchair-bound and unable to stand or go to bathroom to get urine sample.  States this is a misunderstanding.  Has been depressed because unable to see family, but no thoughts of suicide.
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
## 2011-03-20 NOTE — ED Notes (Signed)
Pt denies feeling suicidal, states she has been depressed because she hasn't seen her son in the last few days and that she has been crying more, pt admits to stating that she would kill herself before going back to [facility], pt states she just wants to go back to the nursing home.

## 2011-03-20 NOTE — ED Notes (Signed)
Awaiting telepsych conference.

## 2011-03-21 NOTE — ED Notes (Signed)
Pt resting in bed awaiting d/c to nursing facility ED md states he is working on d/c. Pt infomred.

## 2011-03-21 NOTE — ED Notes (Signed)
Report given to Dutch John at [facility], pt a/o x4, no complaints at this time, pt placed back in her regular clothes

## 2011-03-21 NOTE — ED Notes (Signed)
Pt being d/c'd back to [facility]; [facility] called for transportation.

## 2011-03-21 NOTE — Discharge Instructions (Signed)
Depression  Depression is a strong emotion of feeling unhappy that can last for weeks, months, or even longer. Depression causes problems with the ability to function in life. It upsets your:   Relationships.   Sleep.   Eating habits.   Work habits.  HOME CARE  Take all medicine as told by your doctor.   Talk with a therapist, counselor, or friend.   Eat a healthy diet.   Exercise regularly.   Do not drink alcohol or use drugs.  GET HELP RIGHT AWAY IF: You start to have thoughts about hurting yourself or others. MAKE SURE YOU:  Understand these instructions.   Will watch your condition.   Will get help right away if you are not doing well or get worse.  Document Released: 01/20/2010 Document Revised: 12/07/2010 Document Reviewed: 01/20/2010 [employer] Patient Information 2012 [employer]. 
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
## 2011-04-16 ENCOUNTER — Other Ambulatory Visit: Payer: Self-pay | Admitting: Internal Medicine

## 2011-04-16 DIAGNOSIS — Z1231 Encounter for screening mammogram for malignant neoplasm of breast: Secondary | ICD-10-CM

## 2011-04-20 ENCOUNTER — Ambulatory Visit (HOSPITAL_COMMUNITY): Payer: Medicare Other | Admitting: Psychiatry

## 2011-04-30 ENCOUNTER — Ambulatory Visit
Admission: RE | Admit: 2011-04-30 | Discharge: 2011-04-30 | Disposition: A | Discharge status: Home / Self Care | Payer: PRIVATE HEALTH INSURANCE | Source: Ambulatory Visit | Attending: Internal Medicine | Admitting: Internal Medicine

## 2011-04-30 DIAGNOSIS — Z1231 Encounter for screening mammogram for malignant neoplasm of breast: Secondary | ICD-10-CM

## 2011-05-23 ENCOUNTER — Ambulatory Visit (INDEPENDENT_AMBULATORY_CARE_PROVIDER_SITE_OTHER): Payer: PRIVATE HEALTH INSURANCE | Admitting: Psychiatry

## 2011-05-23 ENCOUNTER — Encounter (HOSPITAL_COMMUNITY): Payer: Self-pay | Admitting: Psychiatry

## 2011-05-23 VITALS — BP 135/61 | HR 75

## 2011-05-23 DIAGNOSIS — F329 Major depressive disorder, single episode, unspecified: Secondary | ICD-10-CM

## 2011-05-23 DIAGNOSIS — F323 Major depressive disorder, single episode, severe with psychotic features: Secondary | ICD-10-CM

## 2011-05-23 MED ORDER — ARIPIPRAZOLE 2 MG PO TABS: 2.0000 mg | ORAL_TABLET | Freq: Every day | ORAL | Status: DC
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
## 2011-06-06 ENCOUNTER — Encounter (HOSPITAL_COMMUNITY): Payer: Self-pay | Admitting: Psychiatry

## 2011-06-06 ENCOUNTER — Ambulatory Visit (INDEPENDENT_AMBULATORY_CARE_PROVIDER_SITE_OTHER): Payer: PRIVATE HEALTH INSURANCE | Admitting: Psychiatry

## 2011-06-06 VITALS — BP 122/60 | HR 74 | Wt 187.0 lb

## 2011-06-06 DIAGNOSIS — F329 Major depressive disorder, single episode, unspecified: Secondary | ICD-10-CM

## 2011-06-06 DIAGNOSIS — F323 Major depressive disorder, single episode, severe with psychotic features: Secondary | ICD-10-CM

## 2011-06-06 MED ORDER — ARIPIPRAZOLE 2 MG PO TABS: 2.0000 mg | ORAL_TABLET | Freq: Two times a day (BID) | ORAL | Status: DC
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
## 2011-07-04 ENCOUNTER — Ambulatory Visit (INDEPENDENT_AMBULATORY_CARE_PROVIDER_SITE_OTHER): Payer: PRIVATE HEALTH INSURANCE | Admitting: Psychiatry

## 2011-07-04 ENCOUNTER — Encounter (HOSPITAL_COMMUNITY): Payer: Self-pay | Admitting: Psychiatry

## 2011-07-04 VITALS — BP 128/60 | HR 89

## 2011-07-04 DIAGNOSIS — F323 Major depressive disorder, single episode, severe with psychotic features: Secondary | ICD-10-CM

## 2011-07-04 DIAGNOSIS — F329 Major depressive disorder, single episode, unspecified: Secondary | ICD-10-CM

## 2011-07-04 MED ORDER — ARIPIPRAZOLE 5 MG PO TABS: 5.0000 mg | ORAL_TABLET | Freq: Every day | ORAL | Status: DC

## 2011-07-04 NOTE — Patient Instructions (Addendum)
I'm going to change your Abilify dose.  You will take Abilify 5 mg only at bedtime.  Continue your other psychiatric medication which is prescribed by her primary care physician.  Please bring you recent blood work results on your next visit.  Followup in 2 months.
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
## 2011-09-05 ENCOUNTER — Ambulatory Visit (INDEPENDENT_AMBULATORY_CARE_PROVIDER_SITE_OTHER): Payer: PRIVATE HEALTH INSURANCE | Admitting: Psychiatry

## 2011-09-05 ENCOUNTER — Encounter (HOSPITAL_COMMUNITY): Payer: Self-pay | Admitting: Psychiatry

## 2011-09-05 VITALS — BP 138/75 | HR 84

## 2011-09-05 DIAGNOSIS — F323 Major depressive disorder, single episode, severe with psychotic features: Secondary | ICD-10-CM

## 2011-09-05 DIAGNOSIS — F329 Major depressive disorder, single episode, unspecified: Secondary | ICD-10-CM

## 2011-09-05 MED ORDER — ARIPIPRAZOLE 5 MG PO TABS: 5.0000 mg | ORAL_TABLET | Freq: Every day | ORAL | Status: DC
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
## 2011-10-18 ENCOUNTER — Encounter: Payer: Self-pay | Admitting: Obstetrics and Gynecology

## 2011-10-18 ENCOUNTER — Ambulatory Visit (INDEPENDENT_AMBULATORY_CARE_PROVIDER_SITE_OTHER): Payer: Medicaid Other | Admitting: Obstetrics and Gynecology

## 2011-10-18 VITALS — BP 132/62 | Ht 64.0 in

## 2011-10-18 DIAGNOSIS — F341 Dysthymic disorder: Secondary | ICD-10-CM

## 2011-10-18 DIAGNOSIS — E119 Type 2 diabetes mellitus without complications: Secondary | ICD-10-CM

## 2011-10-18 DIAGNOSIS — E669 Obesity, unspecified: Secondary | ICD-10-CM

## 2011-10-18 DIAGNOSIS — N906 Unspecified hypertrophy of vulva: Secondary | ICD-10-CM

## 2011-10-18 NOTE — Progress Notes (Signed)
GYN PROBLEM VISIT Subjective: [name] is a 71 y.o. year old female,G1P1, who presents on referral from Dr. Eula Listen for evaluation of a vulvar "swelling".  The pt states it has been present for "years", but begain to hurt her when she entered the nursing home and needed to slide from bed to chair using a physical therapy board.  She denies any bleeding.  She is concerned that this area has increased in size as her clitoris has gotten smaller.  She says she wants this area removed "in case she gets married"   Pertinent Gynecological History: Menses: none Bleeding: none Contraception: abstinence DES exposure: unknown Blood transfusions: none Sexually transmitted diseases: past history: HSVii Previous GYN Procedures: MYOMECTOMY, C-SECTION, HYSTERECTOMY  Last mammogram: normal Date: 05/2011 Last pap: N/A S/P HYSTERECTOMY  OB History: G1, P1   Menstrual History: Menarche age: 68 No LMP recorded. Patient is postmenopausal.    Past Medical History  Diagnosis Date  . Depression   . Diabetes mellitus   . Hypertension   . Peripheral neuropathy   . Diverticulosis   . Urinary incontinence   . Herpes   . Ovarian cyst   . H/O myomectomy   . Fibrous breast lumps     Past Surgical History  Procedure Date  . Back surgery     lower  . Hand surgery     bilateral carpal tunnel surgery  . Cesarean section   . Abdominal hysterectomy   . Myomectomy     Family History  Problem Relation Age of Onset  . Heart disease Mother   . Heart disease Brother   . Diabetes Brother     Social History:  reports that she quit smoking about 15 years ago. Her smoking use included Cigarettes. She quit after 20 years of use. She does not have any smokeless tobacco history on file. She reports that she does not drink alcohol or use illicit drugs.  Allergies:  Allergies  Allergen Reactions  . Sertraline Hcl Hives  . Codeine Hives and Swelling  . Cyclobenzaprine Hcl Hives and Swelling  .  Fentanyl     REACTION: PANIC ATTACKS  . Furosemide     REACTION: face was swollen and hand  . Gadolinium Derivatives Swelling  . Hydrocodone-Acetaminophen   . Iohexol      Code: SOB, Desc: xray dye, Onset Date: [id number]   . Levofloxacin     REACTION: PANIC ATTACK,DIARRHEA,EYES DISCOLORD  . Lorazepam Nausea And Vomiting  . Lubiprostone     REACTION: REALLY BAD NAUSEA  . Metformin Hives and Swelling  . Metoclopramide Nausea And Vomiting and Swelling  . Morphine     REACTION: itching, panic attacks  . Oxycodone Hives and Swelling  . Promethazine Hcl     REACTION: TRAMA ANDJITTERS IN MY BODY  . Rosiglitazone Hives and Swelling  . Rosiglitazone Maleate Hives and Swelling  . Sertraline Hcl Hives and Swelling  . Verapamil Hives and Nausea And Vomiting     ROS  See HPI for pertinent  Blood pressure 132/62, height 5\' 4"  (1.626 m). Physical Exam  Bilateral vulvar elongation without mass.  Mild tenderness to palpation.  Skin erethematous but no denuded areas  Assessment/Plan: Post menopausal vulval elongation which  Is seen not umcommonly and reqires treatment only in the face of vulvitis.  May require increased attenttion to hygeine because of body habitus  Plan : Pt requests elective removal.  This could be done as an outpt but would require pt being in  good medical condition for healing as she sits all day (non ambulatory) and is at risk for pressure ulceration of a surgical site. Will discuss further with Dr. Michaele Offer P 10/18/2011, 2:55 PM

## 2011-10-22 ENCOUNTER — Telehealth: Payer: Self-pay | Admitting: Obstetrics and Gynecology

## 2011-10-22 NOTE — Telephone Encounter (Signed)
Message copied by Raylene Everts on Mon Oct 22, 2011  5:12 PM ------      Message from: Hal Morales      Created: Mon Oct 22, 2011  4:18 PM       Please notify pt that I am awaiting a return call from Dr. Donette Larry before I can make arrangements for that visit.      Thanks,

## 2011-10-22 NOTE — Telephone Encounter (Signed)
Pc to pt's nurse @ nursing facility pt resides. Spoke with pt's nurse Corrie Dandy and told to inform pt awaiting cb from Dr. Eula Listen before arrangements can be made. Mary agrees.

## 2011-10-22 NOTE — Telephone Encounter (Signed)
vh to address

## 2011-11-19 IMAGING — CR DG ABDOMEN ACUTE W/ 1V CHEST
3 series · 3 of 3 positions shown · non-contrast
Comparison: Chest radiograph of 01/01/2009, abdominal radiograph
09/15/2007

CLINICAL DATA: Left upper quadrant pain, nausea, constipation

ACUTE ABDOMEN SERIES (ABDOMEN 2 VIEW & CHEST 1 VIEW)

[w chest pa]
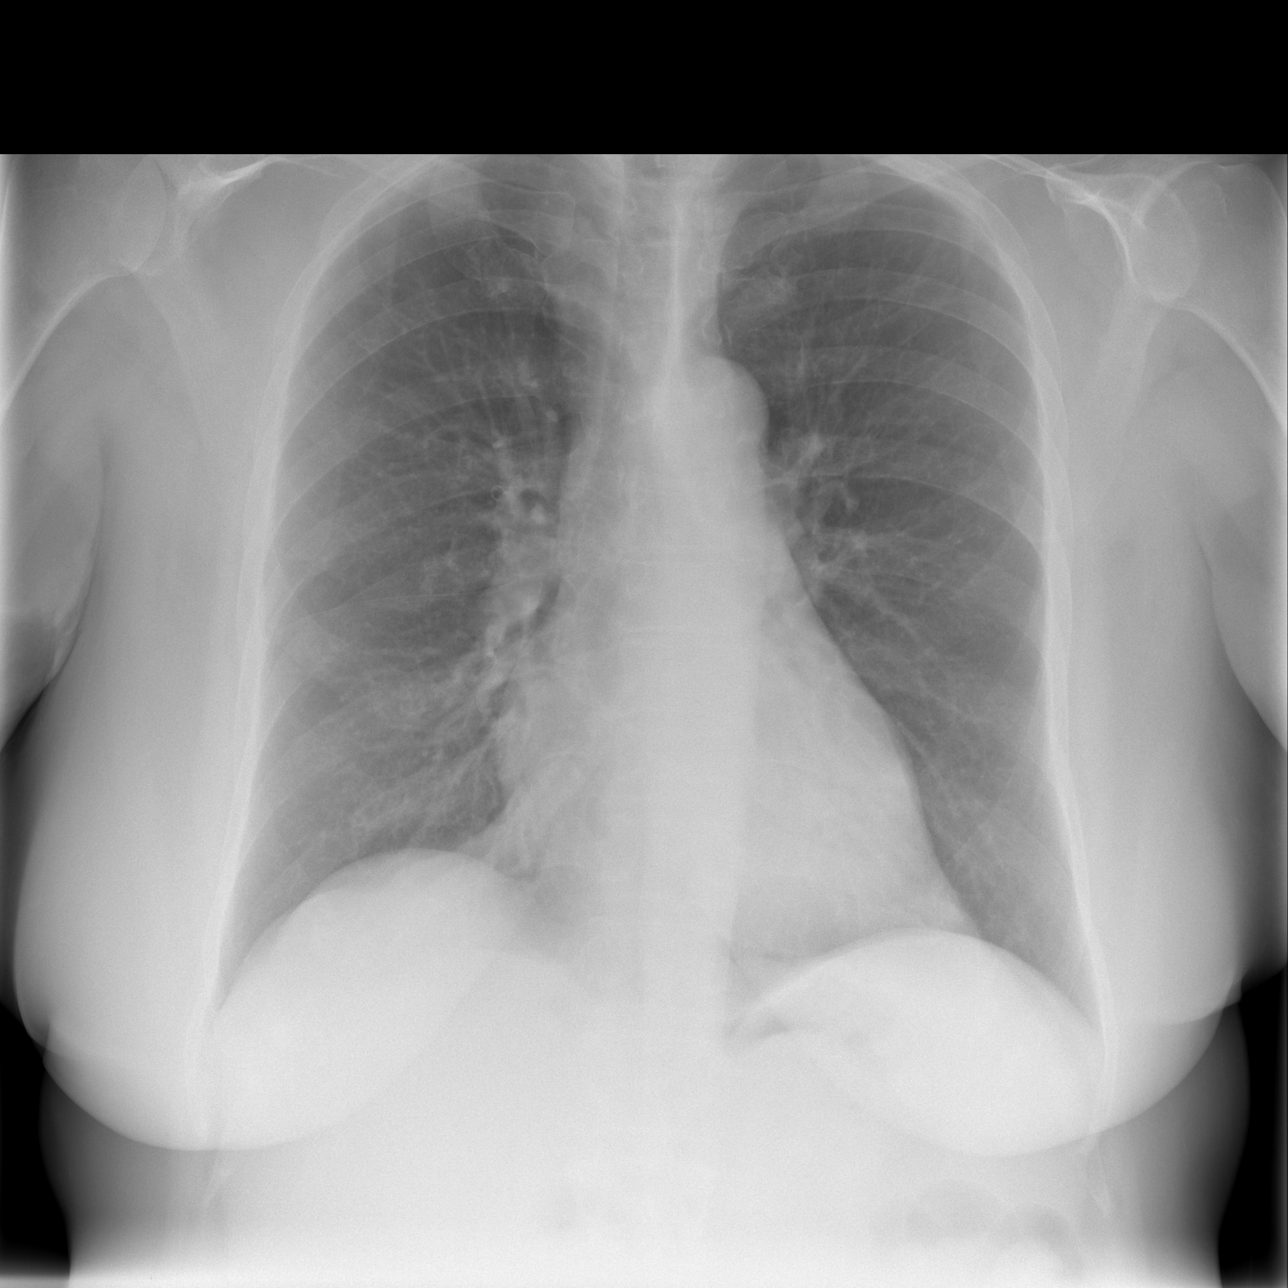

[w abdomen upright *]
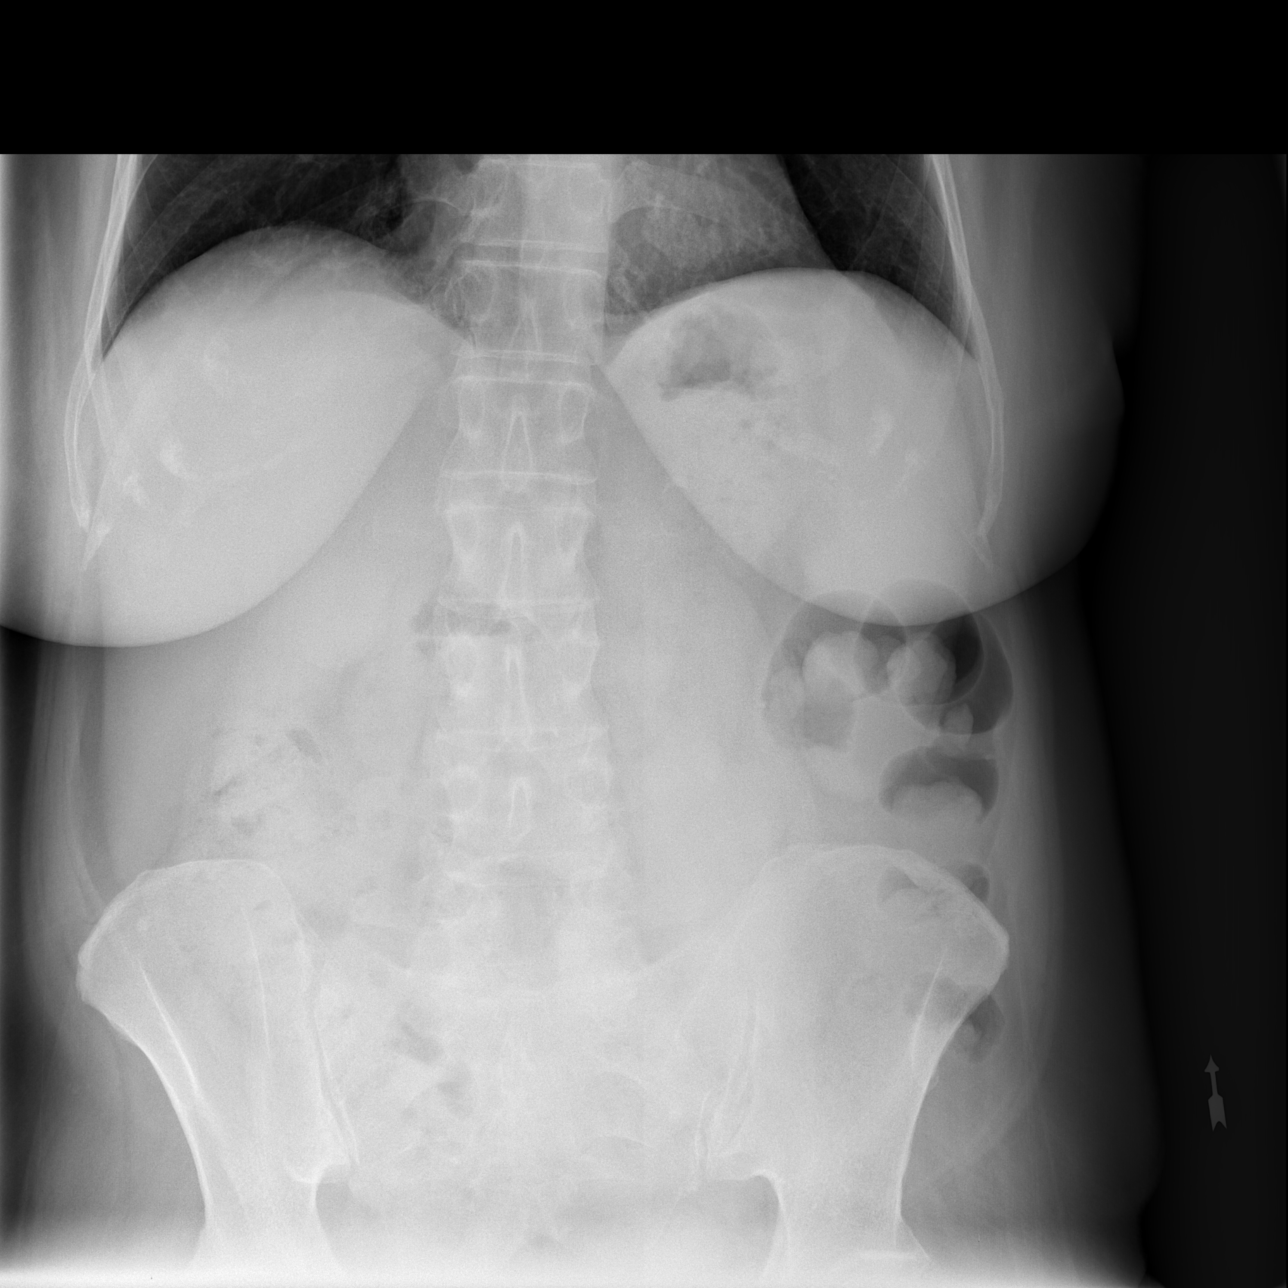

[t abdomen supine]
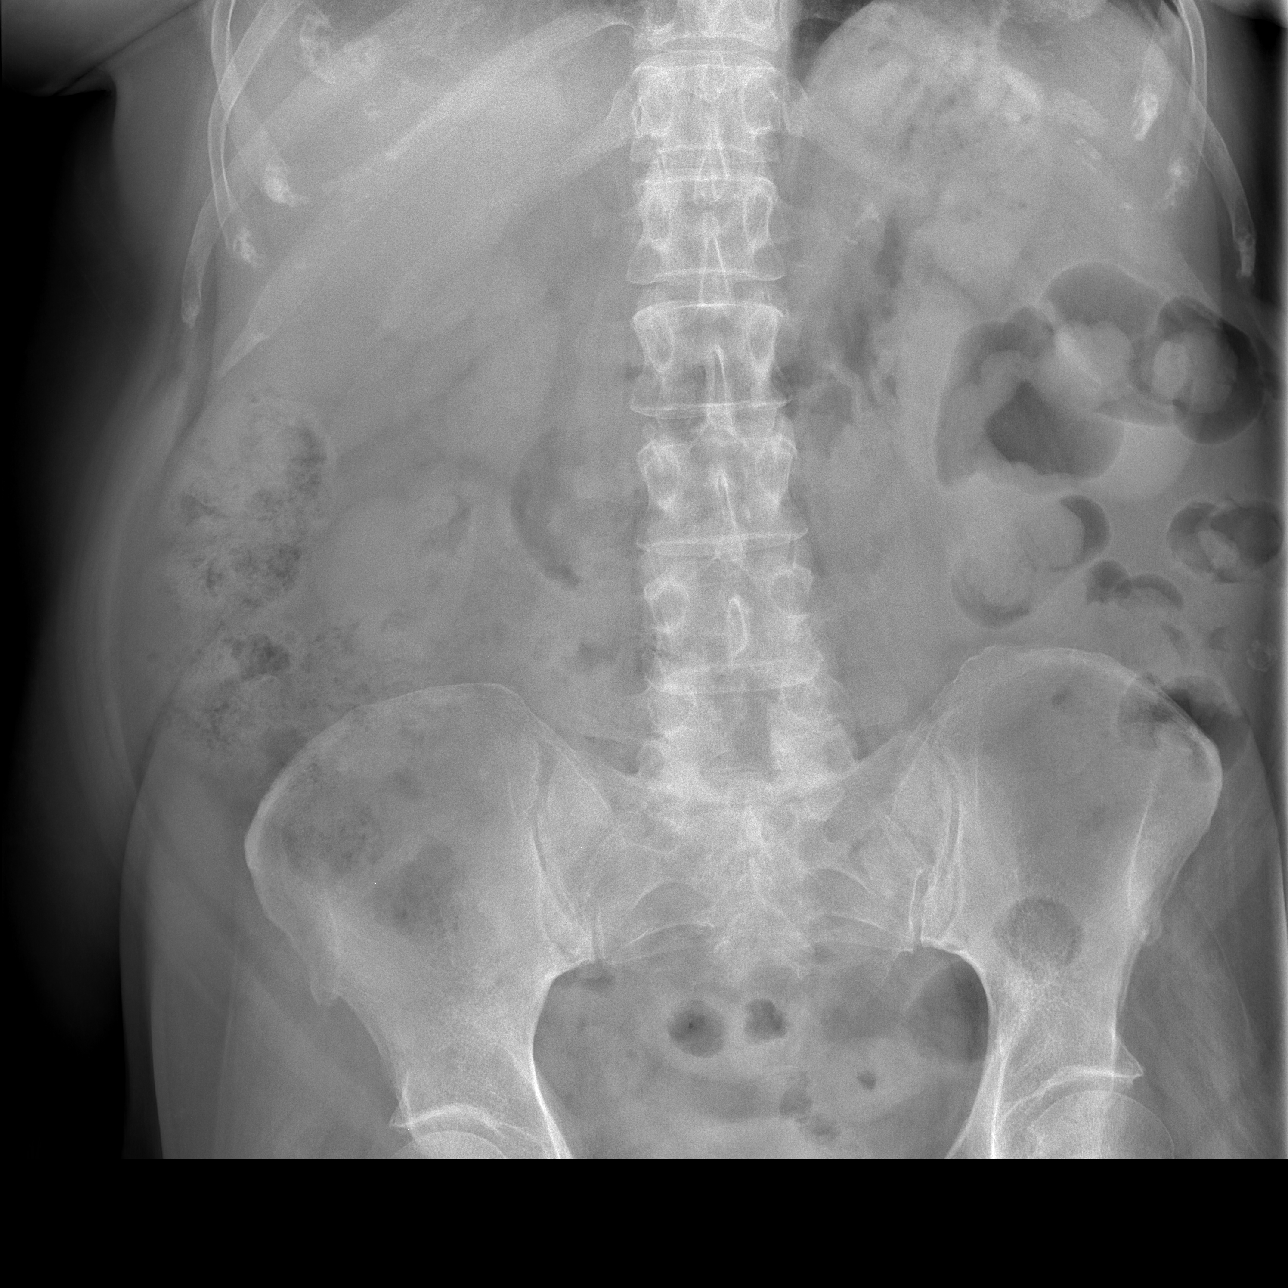

[3 of 3 positions shown; findings below may reference images not displayed]

FINDINGS: Minimal cardiac enlargement.
Normal mediastinal contours and pulmonary vascularity.
No pulmonary infiltrate or pleural effusion.
Scattered stool throughout colon.
Nonobstructive bowel gas pattern.
No bowel dilatation, bowel wall thickening or free intraperitoneal
air.
No urinary tract calcification.
Bones appear demineralized.
Prior L5 laminectomy.
IMPRESSION: No acute abnormalities.

## 2011-12-05 ENCOUNTER — Ambulatory Visit (INDEPENDENT_AMBULATORY_CARE_PROVIDER_SITE_OTHER): Payer: Medicare Other | Admitting: Psychiatry

## 2011-12-05 ENCOUNTER — Encounter (HOSPITAL_COMMUNITY): Payer: Self-pay | Admitting: Psychiatry

## 2011-12-05 VITALS — BP 117/64 | HR 76

## 2011-12-05 DIAGNOSIS — F329 Major depressive disorder, single episode, unspecified: Secondary | ICD-10-CM

## 2011-12-05 MED ORDER — ARIPIPRAZOLE 5 MG PO TABS: 5.0000 mg | ORAL_TABLET | Freq: Every day | ORAL | Status: DC

## 2011-12-05 NOTE — Patient Instructions (Signed)
Please bring list of complete list of medication and recent blood work on her next appointment.
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
## 2012-01-15 ENCOUNTER — Other Ambulatory Visit: Payer: Self-pay | Admitting: Otolaryngology

## 2012-01-15 DIAGNOSIS — J3801 Paralysis of vocal cords and larynx, unilateral: Secondary | ICD-10-CM

## 2012-01-28 ENCOUNTER — Other Ambulatory Visit: Payer: Self-pay | Admitting: Otolaryngology

## 2012-01-28 DIAGNOSIS — J3801 Paralysis of vocal cords and larynx, unilateral: Secondary | ICD-10-CM

## 2012-01-30 ENCOUNTER — Inpatient Hospital Stay: Admission: RE | Admit: 2012-01-30 | Payer: Self-pay | Source: Ambulatory Visit

## 2012-02-01 ENCOUNTER — Ambulatory Visit
Admission: RE | Admit: 2012-02-01 | Discharge: 2012-02-01 | Disposition: A | Discharge status: Home / Self Care | Payer: PRIVATE HEALTH INSURANCE | Source: Ambulatory Visit | Attending: Otolaryngology | Admitting: Otolaryngology

## 2012-02-01 DIAGNOSIS — J3801 Paralysis of vocal cords and larynx, unilateral: Secondary | ICD-10-CM

## 2012-02-26 ENCOUNTER — Other Ambulatory Visit: Payer: Self-pay | Admitting: Internal Medicine

## 2012-03-05 ENCOUNTER — Ambulatory Visit (HOSPITAL_COMMUNITY): Payer: Self-pay | Admitting: Psychiatry

## 2012-03-06 ENCOUNTER — Ambulatory Visit
Admission: RE | Admit: 2012-03-06 | Discharge: 2012-03-06 | Disposition: A | Discharge status: Home / Self Care | Payer: PRIVATE HEALTH INSURANCE | Source: Ambulatory Visit | Attending: Internal Medicine | Admitting: Internal Medicine

## 2012-03-06 ENCOUNTER — Other Ambulatory Visit: Payer: Self-pay | Admitting: Internal Medicine

## 2012-03-06 DIAGNOSIS — N6310 Unspecified lump in the right breast, unspecified quadrant: Secondary | ICD-10-CM

## 2012-03-17 ENCOUNTER — Ambulatory Visit (INDEPENDENT_AMBULATORY_CARE_PROVIDER_SITE_OTHER): Payer: Medicare Other | Admitting: General Surgery

## 2012-03-17 ENCOUNTER — Encounter (INDEPENDENT_AMBULATORY_CARE_PROVIDER_SITE_OTHER): Payer: Self-pay | Admitting: General Surgery

## 2012-03-17 VITALS — BP 128/82 | HR 72 | Temp 98.5°F | Resp 12

## 2012-03-17 DIAGNOSIS — D249 Benign neoplasm of unspecified breast: Secondary | ICD-10-CM | POA: Insufficient documentation

## 2012-03-17 DIAGNOSIS — D241 Benign neoplasm of right breast: Secondary | ICD-10-CM

## 2012-03-17 NOTE — Progress Notes (Signed)
Patient ID: Katherine Carr, female   DOB: 09/25/1940, 71 y.o.   MRN: [id number]  Chief Complaint  Patient presents with  . Other    right breast lump    HPI Katherine Carr is a 72 y.o. female.  She is referred by Dr. Micheline Maze at the breast center of [facility] for evaluation and management of an intraductal papilloma of the right breast.  This patient lives at [facility] in skilled nursing. She is wheelchair-bound because of diabetic neuropathy. She has spinal stenosis and has had cervical spine surgery. She has obesity, depression, hypertension, asthma, and GERD.  A few years ago she underwent excision of the ductal system left breast for benign disease. She now presents with a one-week history of a palpable mass in the right breast, areolar margin the 2:00 position. She states that she has had whitish discharge off and on for a year and it is occasionally clear, but never bloody. She underwent right breast mammogram and ultrasound and this showed a dilated duct in the area - 8 mm diameter echogenic debris within the duct of the right breast at the 2:00 position, subareolar area. Image guided biopsy shows intraductal papilloma with the usual ductal hyperplasia.  She is motivated to have this area excised, status he does not want to transform into cancer.  HPI  Past Medical History  Diagnosis Date  . Depression   . Diabetes mellitus   . Hypertension   . Peripheral neuropathy   . Diverticulosis   . Urinary incontinence   . Herpes   . Ovarian cyst   . H/O myomectomy   . Fibrous breast lumps     Past Surgical History  Procedure Laterality Date  . Back surgery      lower  . Hand surgery      bilateral carpal tunnel surgery  . Cesarean section    . Abdominal hysterectomy    . Myomectomy      Family History  Problem Relation Age of Onset  . Heart disease Mother   . Heart disease Brother   . Diabetes Brother     Social History History  Substance Use Topics  .  Smoking status: Former Smoker -- 20 years    Types: Cigarettes    Quit date: 08/15/1996  . Smokeless tobacco: Not on file     Comment: smoked 2-3 cigarettes/day  . Alcohol Use: No    Allergies  Allergen Reactions  . Sertraline Hcl Hives  . Codeine Hives and Swelling  . Cyclobenzaprine Hcl Hives and Swelling  . Fentanyl     REACTION: PANIC ATTACKS  . Furosemide     REACTION: face was swollen and hand  . Gadolinium Derivatives Swelling  . Hydrocodone-Acetaminophen   . Iohexol      Code: SOB, Desc: xray dye, Onset Date: [id number]   . Levofloxacin     REACTION: PANIC ATTACK,DIARRHEA,EYES DISCOLORD  . Lorazepam Nausea And Vomiting  . Lubiprostone     REACTION: REALLY BAD NAUSEA  . Metformin Hives and Swelling  . Metoclopramide Nausea And Vomiting and Swelling  . Morphine     REACTION: itching, panic attacks  . Oxycodone Hives and Swelling  . Promethazine Hcl     REACTION: TRAMA ANDJITTERS IN MY BODY  . Rosiglitazone Hives and Swelling  . Rosiglitazone Maleate Hives and Swelling  . Sertraline Hcl Hives and Swelling  . Verapamil Hives and Nausea And Vomiting    Current Outpatient Prescriptions  Medication Sig Dispense Refill  . ALPRAZolam (  XANAX) 0.5 MG tablet Take 0.5 mg by mouth 2 (two) times daily.       . ARIPiprazole (ABILIFY) 5 MG tablet Take 1 tablet (5 mg total) by mouth at bedtime.  30 tablet  2  . atorvastatin (LIPITOR) 10 MG tablet Take 10 mg by mouth at bedtime.      . bisacodyl (DULCOLAX) 10 MG suppository Place 10 mg rectally daily as needed.        . Cholecalciferol (VITAMIN D3) 1000 UNITS CAPS Take 2,000 Units by mouth daily.      . DULoxetine (CYMBALTA) 60 MG capsule Take 60 mg by mouth 2 (two) times daily.      Marland Kitchen estradiol (ESTRACE) 0.5 MG tablet Take 0.5 mg by mouth daily.      . Fluticasone Propionate (FLONASE NA) Place 2 sprays into the nose daily.      Marland Kitchen gabapentin (NEURONTIN) 600 MG tablet Take 600 mg by mouth 3 (three) times daily.        Marland Kitchen HUMALOG  100 UNIT/ML injection       . HYDROcodone-acetaminophen (NORCO) 5-325 MG per tablet Take 1 tablet by mouth every 6 (six) hours as needed.        . insulin glargine (LANTUS) 100 UNIT/ML injection Inject 65 Units into the skin 2 (two) times daily.       . lansoprazole (PREVACID) 30 MG capsule Take 30 mg by mouth daily.      Marland Kitchen lidocaine (LIDODERM) 5 % Place 1 patch onto the skin daily. Remove & Discard patch within 12 hours or as directed by MD       . lisinopril (PRINIVIL,ZESTRIL) 2.5 MG tablet       . MELATONIN PO Take by mouth.      . metoprolol succinate (TOPROL-XL) 25 MG 24 hr tablet Take 25 mg by mouth daily.        . polyvinyl alcohol (LIQUIFILM TEARS) 1.4 % ophthalmic solution 2 drops 2 (two) times daily.      Bernadette Hoit Sodium (SENNA S PO) Take by mouth 2 (two) times daily.      Marland Kitchen zolpidem (AMBIEN) 5 MG tablet Take 5 mg by mouth at bedtime.      . [DISCONTINUED] ClonazePAM (KLONOPIN PO) Take 0.2 mg by mouth 2 (two) times daily.        . [DISCONTINUED] escitalopram (LEXAPRO) 20 MG tablet Take 10 mg by mouth. Take 3 tablets in the AM      . [DISCONTINUED] esomeprazole (NEXIUM) 40 MG capsule Take 40 mg by mouth daily before breakfast.        . [DISCONTINUED] Insulin Regular Human (HUMULIN R U-500, CONCENTRATED, [place]) Inject into the skin 3 (three) times daily. Take three times / day prn, per SS Insulin coverage       No current facility-administered medications for this visit.    Review of Systems Review of Systems  Constitutional: Negative for fever, chills and unexpected weight change.  HENT: Negative for hearing loss, congestion, sore throat, trouble swallowing and voice change.   Eyes: Negative for visual disturbance.  Respiratory: Positive for shortness of breath. Negative for cough and wheezing.   Cardiovascular: Negative for chest pain, palpitations and leg swelling.  Gastrointestinal: Negative for nausea, vomiting, abdominal pain, diarrhea, constipation, blood in stool,  abdominal distention and anal bleeding.  Genitourinary: Negative for hematuria, vaginal bleeding and difficulty urinating.  Musculoskeletal: Positive for myalgias, back pain, arthralgias and gait problem.  Skin: Negative for rash and wound.  Neurological:  Negative for seizures, syncope and headaches.  Hematological: Negative for adenopathy. Does not bruise/bleed easily.  Psychiatric/Behavioral: Negative for confusion.    Blood pressure 128/82, pulse 72, temperature 98.5 F (36.9 C), temperature source Oral, resp. rate 12, weight 0 lb (0 kg).  Physical Exam Physical Exam  Constitutional: She is oriented to person, place, and time. She appears well-developed and well-nourished. No distress.  Alert. Cooperative. Oriented. In a wheelchair. Obese. A friend is with her throughout the encounter.  HENT:  Head: Normocephalic and atraumatic.  Nose: Nose normal.  Mouth/Throat: No oropharyngeal exudate.  Eyes: Conjunctivae and EOM are normal. Pupils are equal, round, and reactive to light. Left eye exhibits no discharge. No scleral icterus.  Neck: Neck supple. No JVD present. No tracheal deviation present. No thyromegaly present.  Cardiovascular: Normal rate, regular rhythm, normal heart sounds and intact distal pulses.   No murmur heard. Pulmonary/Chest: Effort normal and breath sounds normal. No respiratory distress. She has no wheezes. She has no rales. She exhibits no tenderness.  The left breast is soft and without mass. Well-healed circumareolar incision lower inner quadrant. No axillary adenopathy. Right breast reveals a bruise in the upper outer periareolar area. I might feel the area of palpable abnormality, but there is a hematoma and it is somewhat difficult to be sure. No nipple discharge. No other masses. No axillary adenopathy  Abdominal: Soft. Bowel sounds are normal. She exhibits no distension and no mass. There is no tenderness. There is no rebound and no guarding.  Musculoskeletal:  She exhibits edema. She exhibits no tenderness.  Lymphadenopathy:    She has no cervical adenopathy.  Neurological: She is alert and oriented to person, place, and time. She exhibits normal muscle tone. Coordination normal.  Skin: Skin is warm. No rash noted. She is not diaphoretic. No erythema. No pallor.  Psychiatric: She has a normal mood and affect. Her behavior is normal. Judgment and thought content normal.    Data Reviewed I reviewed her right breast imaging studies and the histology report I reviewed her medication list from [facility].  Assessment    Intraductal papilloma right breast, periareolar area, 2:00 position. Excision is indicated.  Wheelchair-bound due to diabetic neuropathy  Hyperlipidemia  Obesity  Anxiety and depression  Hypertension  Asthma  GERD  Fibromyalgia  Cervical disc surgery and plating.     Plan    We'll schedule for right breast ductal system excision with wire localization. Hopefully they can get a wire this but if it is too superficial I will simply excise the subareolar area in the upper outer quadrant.  I discussed the indications, details, techniques, and numerous risks of the surgery with the patient and her friend. She she understands all these issues and all her questions were answered. She agrees with this plan.        Angelia Mould. Derrell Lolling, M.D., [facility] General and Minimally invasive Surgery Breast and Colorectal Surgery Office:   [phone] Pager:   [phone]  03/17/2012, 2:17 PM

## 2012-03-17 NOTE — Patient Instructions (Signed)
The biopsy of the lump in your right breast shows an intraductal papilloma. At this time, this has probably not become a cancer. There is a small chance that there may be non-invasive cancer present.  You'll be scheduled for excision of this area with a wire localization technique in the near future.  This will be an outpatient surgery and you'll be able to go back to [facility] the same day     Lumpectomy, Breast Conserving Surgery A lumpectomy is breast surgery that removes only part of the breast. Another name used may be partial mastectomy. The amount removed varies. Make sure you understand how much of your breast will be removed. Reasons for a lumpectomy:  Any solid breast mass.  Grouped significant nodularity that may be confused with a solitary breast mass. Lumpectomy is the most common form of breast cancer surgery today. The surgeon removes the portion of your breast which contains the tumor (cancer). This is the lump. Some normal tissue around the lump is also removed to be sure that all the tumor has been removed.  If cancer cells are found in the margins where the breast tissue was removed, your surgeon will do more surgery to remove the remaining cancer tissue. This is called re-excision surgery. Radiation and/or chemotherapy treatments are often given following a lumpectomy to kill any cancer cells that could possibly remain.  REASONS YOU MAY NOT BE ABLE TO HAVE BREAST CONSERVING SURGERY:  The tumor is located in more than one place.  Your breast is small and the tumor is large so the breast would be disfigured.  The entire tumor removal is not successful with a lumpectomy.  You cannot commit to a full course of chemotherapy, radiation therapy or are pregnant and cannot have radiation.  You have previously had radiation to the breast to treat cancer. HOW A LUMPECTOMY IS PERFORMED If overnight nursing is not required following a biopsy, a lumpectomy can be performed as a  same-day surgery. This can be done in a hospital, clinic, or surgical center. The anesthesia used will depend on your surgeon. They will discuss this with you. A general anesthetic keeps you sleeping through the procedure. LET YOUR CAREGIVERS KNOW ABOUT THE FOLLOWING:  Allergies  Medications taken including herbs, eye drops, over the counter medications, and creams.  Use of steroids (by mouth or creams)  Previous problems with anesthetics or Novocaine.  Possibility of pregnancy, if this applies  History of blood clots (thrombophlebitis)  History of bleeding or blood problems.  Previous surgery  Other health problems BEFORE THE PROCEDURE You should be present one hour prior to your procedure unless directed otherwise.  AFTER THE PROCEDURE  After surgery, you will be taken to the recovery area where a nurse will watch and check your progress. Once you're awake, stable, and taking fluids well, barring other problems you will be allowed to go home.  Ice packs applied to your operative site may help with discomfort and keep the swelling down.  A small rubber drain may be placed in the breast for a couple of days to prevent a hematoma from developing in the breast.  A pressure dressing may be applied for 24 to 48 hours to prevent bleeding.  Keep the wound dry.  You may resume a normal diet and activities as directed. Avoid strenuous activities affecting the arm on the side of the biopsy site such as tennis, swimming, heavy lifting (more than 10 pounds) or pulling.  Bruising in the breast is normal  following this procedure.  Wearing a bra - even to bed - may be more comfortable and also help keep the dressing on.  Change dressings as directed.  Only take over-the-counter or prescription medicines for pain, discomfort, or fever as directed by your caregiver. Call for your results as instructed by your surgeon. Remember it is your responsibility to get the results of your lumpectomy  if your surgeon asked you to follow-up. Do not assume everything is fine if you have not heard from your caregiver. SEEK MEDICAL CARE IF:   There is increased bleeding (more than a small spot) from the wound.  You notice redness, swelling, or increasing pain in the wound.  Pus is coming from wound.  An unexplained oral temperature above 102 F (38.9 C) develops.  You notice a foul smell coming from the wound or dressing. SEEK IMMEDIATE MEDICAL CARE IF:   You develop a rash.  You have difficulty breathing.  You have any allergic problems. Document Released: 01/29/2006 Document Revised: 03/12/2011 Document Reviewed: 05/02/2006 [facility] Patient Information 2013 [place], [place].

## 2012-03-31 ENCOUNTER — Encounter (HOSPITAL_BASED_OUTPATIENT_CLINIC_OR_DEPARTMENT_OTHER): Payer: Self-pay | Admitting: *Deleted

## 2012-03-31 NOTE — H&P (Signed)
Katherine Carr   MRN:  [id number]   Description: 72 year old female  Provider: [name], MD  Department: [facility]        Diagnoses    Intraductal papilloma of breast, right    -  Primary    217   Reason for Visit    Other    right breast lump        Current Vitals   BP Pulse Temp(Src) Resp  128/82 72 98.5 F (36.9 C) (Oral) 12           History and Physical    [name], MD    Status: Signed                          HPI Katherine Carr is a 72 y.o. female.  She is referred by Dr. Micheline Maze at the breast center of [facility] for evaluation and management of an intraductal papilloma of the right breast.   This patient lives at [facility] in skilled nursing. She is wheelchair-bound because of diabetic neuropathy. She has spinal stenosis and has had cervical spine surgery. She has obesity, depression, hypertension, asthma, and GERD.   A few years ago she underwent excision of the ductal system left breast for benign disease. She now presents with a one-week history of a palpable mass in the right breast, areolar margin the 2:00 position. She states that she has had whitish discharge off and on for a year and it is occasionally clear, but never bloody. She underwent right breast mammogram and ultrasound and this showed a dilated duct in the area - 8 mm diameter echogenic debris within the duct of the right breast at the 2:00 position, subareolar area. Image guided biopsy shows intraductal papilloma with the usual ductal hyperplasia.   She is motivated to have this area excised, states she  does not want it to transform into cancer.        Past Medical History   Diagnosis  Date   .  Depression     .  Diabetes mellitus     .  Hypertension     .  Peripheral neuropathy     .  Diverticulosis     .  Urinary incontinence     .  Herpes     .  Ovarian cyst     .  H/O myomectomy     .  Fibrous breast lumps           Past  Surgical History   Procedure  Laterality  Date   .  Back surgery           lower   .  Hand surgery           bilateral carpal tunnel surgery   .  Cesarean section       .  Abdominal hysterectomy       .  Myomectomy             Family History   Problem  Relation  Age of Onset   .  Heart disease  Mother     .  Heart disease  Brother     .  Diabetes  Brother          Social History History   Substance Use Topics   .  Smoking status:  Former Smoker -- 20 years       Types:  Cigarettes  Quit date:  08/15/1996   .  Smokeless tobacco:  Not on file         Comment: smoked 2-3 cigarettes/day   .  Alcohol Use:  No         Allergies   Allergen  Reactions   .  Sertraline Hcl  Hives   .  Codeine  Hives and Swelling   .  Cyclobenzaprine Hcl  Hives and Swelling   .  Fentanyl         REACTION: PANIC ATTACKS   .  Furosemide         REACTION: face was swollen and hand   .  Gadolinium Derivatives  Swelling   .  Hydrocodone-Acetaminophen     .  Iohexol         Code: SOB, Desc: xray dye, Onset Date: [id number]    .  Levofloxacin         REACTION: PANIC ATTACK,DIARRHEA,EYES DISCOLORD   .  Lorazepam  Nausea And Vomiting   .  Lubiprostone         REACTION: REALLY BAD NAUSEA   .  Metformin  Hives and Swelling   .  Metoclopramide  Nausea And Vomiting and Swelling   .  Morphine         REACTION: itching, panic attacks   .  Oxycodone  Hives and Swelling   .  Promethazine Hcl         REACTION: TRAMA ANDJITTERS IN MY BODY   .  Rosiglitazone  Hives and Swelling   .  Rosiglitazone Maleate  Hives and Swelling   .  Sertraline Hcl  Hives and Swelling   .  Verapamil  Hives and Nausea And Vomiting         Current Outpatient Prescriptions   Medication  Sig  Dispense  Refill   .  ALPRAZolam (XANAX) 0.5 MG tablet  Take 0.5 mg by mouth 2 (two) times daily.          .  ARIPiprazole (ABILIFY) 5 MG tablet  Take 1 tablet (5 mg total) by mouth at bedtime.   30 tablet   2   .   atorvastatin (LIPITOR) 10 MG tablet  Take 10 mg by mouth at bedtime.         .  bisacodyl (DULCOLAX) 10 MG suppository  Place 10 mg rectally daily as needed.           .  Cholecalciferol (VITAMIN D3) 1000 UNITS CAPS  Take 2,000 Units by mouth daily.         .  DULoxetine (CYMBALTA) 60 MG capsule  Take 60 mg by mouth 2 (two) times daily.         Marland Kitchen  estradiol (ESTRACE) 0.5 MG tablet  Take 0.5 mg by mouth daily.         .  Fluticasone Propionate (FLONASE NA)  Place 2 sprays into the nose daily.         Marland Kitchen  gabapentin (NEURONTIN) 600 MG tablet  Take 600 mg by mouth 3 (three) times daily.           Marland Kitchen  HUMALOG 100 UNIT/ML injection           .  HYDROcodone-acetaminophen (NORCO) 5-325 MG per tablet  Take 1 tablet by mouth every 6 (six) hours as needed.           .  insulin glargine (LANTUS) 100 UNIT/ML injection  Inject 65 Units into the skin  2 (two) times daily.          .  lansoprazole (PREVACID) 30 MG capsule  Take 30 mg by mouth daily.         Marland Kitchen  lidocaine (LIDODERM) 5 %  Place 1 patch onto the skin daily. Remove & Discard patch within 12 hours or as directed by MD          .  lisinopril (PRINIVIL,ZESTRIL) 2.5 MG tablet           .  MELATONIN PO  Take by mouth.         .  metoprolol succinate (TOPROL-XL) 25 MG 24 hr tablet  Take 25 mg by mouth daily.           .  polyvinyl alcohol (LIQUIFILM TEARS) 1.4 % ophthalmic solution  2 drops 2 (two) times daily.         Bernadette Hoit Sodium (SENNA S PO)  Take by mouth 2 (two) times daily.         Marland Kitchen  zolpidem (AMBIEN) 5 MG tablet  Take 5 mg by mouth at bedtime.         .  [DISCONTINUED] ClonazePAM (KLONOPIN PO)  Take 0.2 mg by mouth 2 (two) times daily.           .  [DISCONTINUED] escitalopram (LEXAPRO) 20 MG tablet  Take 10 mg by mouth. Take 3 tablets in the AM         .  [DISCONTINUED] esomeprazole (NEXIUM) 40 MG capsule  Take 40 mg by mouth daily before breakfast.           .  [DISCONTINUED] Insulin Regular Human (HUMULIN R U-500, CONCENTRATED,  [place])  Inject into the skin 3 (three) times daily. Take three times / day prn, per SS Insulin coverage             No current facility-administered medications for this visit.        Review of Systems   Constitutional: Negative for fever, chills and unexpected weight change.  HENT: Negative for hearing loss, congestion, sore throat, trouble swallowing and voice change.   Eyes: Negative for visual disturbance.  Respiratory: Positive for shortness of breath. Negative for cough and wheezing.   Cardiovascular: Negative for chest pain, palpitations and leg swelling.  Gastrointestinal: Negative for nausea, vomiting, abdominal pain, diarrhea, constipation, blood in stool, abdominal distention and anal bleeding.  Genitourinary: Negative for hematuria, vaginal bleeding and difficulty urinating.  Musculoskeletal: Positive for myalgias, back pain, arthralgias and gait problem.  Skin: Negative for rash and wound.  Neurological: Negative for seizures, syncope and headaches.  Hematological: Negative for adenopathy. Does not bruise/bleed easily.  Psychiatric/Behavioral: Negative for confusion.      Blood pressure 128/82, pulse 72, temperature 98.5 F (36.9 C), temperature source Oral, resp. rate 12, weight 0 lb (0 kg).   Physical Exam  Constitutional: She is oriented to person, place, and time. She appears well-developed and well-nourished. No distress.  Alert. Cooperative. Oriented. In a wheelchair. Obese. A friend is with her throughout the encounter.  HENT:   Head: Normocephalic and atraumatic.   Nose: Nose normal.   Mouth/Throat: No oropharyngeal exudate.  Eyes: Conjunctivae and EOM are normal. Pupils are equal, round, and reactive to light. Left eye exhibits no discharge. No scleral icterus.  Neck: Neck supple. No JVD present. No tracheal deviation present. No thyromegaly present.  Cardiovascular: Normal rate, regular rhythm, normal heart sounds and intact distal pulses.  No murmur  heard. Pulmonary/Chest: Effort normal and breath sounds normal. No respiratory distress. She has no wheezes. She has no rales. She exhibits no tenderness.  The left breast is soft and without mass. Well-healed circumareolar incision lower inner quadrant. No axillary adenopathy. Right breast reveals a bruise in the upper outer periareolar area. I might feel the area of palpable abnormality, but there is a hematoma and it is somewhat difficult to be sure. No nipple discharge. No other masses. No axillary adenopathy  Abdominal: Soft. Bowel sounds are normal. She exhibits no distension and no mass. There is no tenderness. There is no rebound and no guarding.  Musculoskeletal: She exhibits edema. She exhibits no tenderness.  Lymphadenopathy:    She has no cervical adenopathy.  Neurological: She is alert and oriented to person, place, and time. She exhibits normal muscle tone. Coordination normal.  Skin: Skin is warm. No rash noted. She is not diaphoretic. No erythema. No pallor.  Psychiatric: She has a normal mood and affect. Her behavior is normal. Judgment and thought content normal.      Data Reviewed I reviewed her right breast imaging studies and the histology report I reviewed her medication list from [facility].   Assessment    Intraductal papilloma right breast, periareolar area, 2:00 position. Excision is indicated.   Wheelchair-bound due to diabetic neuropathy   Hyperlipidemia   Obesity   Anxiety and depression   Hypertension   Asthma   GERD   Fibromyalgia   Cervical disc surgery and plating.      Plan    We'll schedule for right breast ductal system excision with wire localization. Hopefully they can get a wire this but if it is too superficial I will simply excise the subareolar area in the upper outer quadrant.   I discussed the indications, details, techniques, and numerous risks of the surgery with the patient and her friend. She she understands all  these issues and all her questions were answered. She agrees with this plan.           Angelia Mould. Derrell Lolling, M.D., [facility] General and Minimally invasive Surgery Breast and Colorectal Surgery Office:   [phone] Pager:   [phone]

## 2012-03-31 NOTE — Progress Notes (Signed)
Talked with Katherine Carr at [facility] x2 about pt-she can sign her own permit-they will do her bmet and ekg and have faxed here by 04/01/12-have her at breast center 715am 04/02/12 then bring her here after NL-they will pick her up when she is ready to go back-they are a skilled facility.

## 2012-04-02 ENCOUNTER — Encounter (HOSPITAL_BASED_OUTPATIENT_CLINIC_OR_DEPARTMENT_OTHER): Payer: Self-pay | Admitting: Anesthesiology

## 2012-04-02 ENCOUNTER — Other Ambulatory Visit (INDEPENDENT_AMBULATORY_CARE_PROVIDER_SITE_OTHER): Payer: Self-pay | Admitting: General Surgery

## 2012-04-02 ENCOUNTER — Ambulatory Visit (HOSPITAL_BASED_OUTPATIENT_CLINIC_OR_DEPARTMENT_OTHER): Payer: PRIVATE HEALTH INSURANCE | Admitting: Anesthesiology

## 2012-04-02 ENCOUNTER — Encounter (HOSPITAL_BASED_OUTPATIENT_CLINIC_OR_DEPARTMENT_OTHER)
Admission: RE | Disposition: A | Discharge status: Skilled Nursing Facility | Payer: Self-pay | Source: Ambulatory Visit | Attending: General Surgery

## 2012-04-02 ENCOUNTER — Ambulatory Visit (HOSPITAL_BASED_OUTPATIENT_CLINIC_OR_DEPARTMENT_OTHER)
Admission: RE | Admit: 2012-04-02 | Discharge: 2012-04-02 | Disposition: A | Discharge status: Skilled Nursing Facility | Payer: PRIVATE HEALTH INSURANCE | Source: Ambulatory Visit | Attending: General Surgery | Admitting: General Surgery

## 2012-04-02 ENCOUNTER — Ambulatory Visit
Admission: RE | Admit: 2012-04-02 | Discharge: 2012-04-02 | Disposition: A | Discharge status: Home / Self Care | Payer: PRIVATE HEALTH INSURANCE | Source: Ambulatory Visit | Attending: General Surgery | Admitting: General Surgery

## 2012-04-02 DIAGNOSIS — D241 Benign neoplasm of right breast: Secondary | ICD-10-CM

## 2012-04-02 DIAGNOSIS — M4800 Spinal stenosis, site unspecified: Secondary | ICD-10-CM | POA: Insufficient documentation

## 2012-04-02 DIAGNOSIS — E1142 Type 2 diabetes mellitus with diabetic polyneuropathy: Secondary | ICD-10-CM | POA: Insufficient documentation

## 2012-04-02 DIAGNOSIS — J45909 Unspecified asthma, uncomplicated: Secondary | ICD-10-CM | POA: Insufficient documentation

## 2012-04-02 DIAGNOSIS — Z79899 Other long term (current) drug therapy: Secondary | ICD-10-CM | POA: Insufficient documentation

## 2012-04-02 DIAGNOSIS — K219 Gastro-esophageal reflux disease without esophagitis: Secondary | ICD-10-CM | POA: Insufficient documentation

## 2012-04-02 DIAGNOSIS — Z888 Allergy status to other drugs, medicaments and biological substances status: Secondary | ICD-10-CM | POA: Insufficient documentation

## 2012-04-02 DIAGNOSIS — E669 Obesity, unspecified: Secondary | ICD-10-CM | POA: Insufficient documentation

## 2012-04-02 DIAGNOSIS — D249 Benign neoplasm of unspecified breast: Secondary | ICD-10-CM | POA: Insufficient documentation

## 2012-04-02 DIAGNOSIS — Z87891 Personal history of nicotine dependence: Secondary | ICD-10-CM | POA: Insufficient documentation

## 2012-04-02 DIAGNOSIS — D0590 Unspecified type of carcinoma in situ of unspecified breast: Secondary | ICD-10-CM

## 2012-04-02 DIAGNOSIS — I10 Essential (primary) hypertension: Secondary | ICD-10-CM | POA: Insufficient documentation

## 2012-04-02 DIAGNOSIS — E1149 Type 2 diabetes mellitus with other diabetic neurological complication: Secondary | ICD-10-CM | POA: Insufficient documentation

## 2012-04-02 DIAGNOSIS — Z794 Long term (current) use of insulin: Secondary | ICD-10-CM | POA: Insufficient documentation

## 2012-04-02 DIAGNOSIS — D235 Other benign neoplasm of skin of trunk: Secondary | ICD-10-CM | POA: Insufficient documentation

## 2012-04-02 DIAGNOSIS — Z885 Allergy status to narcotic agent status: Secondary | ICD-10-CM | POA: Insufficient documentation

## 2012-04-02 DIAGNOSIS — F3289 Other specified depressive episodes: Secondary | ICD-10-CM | POA: Insufficient documentation

## 2012-04-02 DIAGNOSIS — L820 Inflamed seborrheic keratosis: Secondary | ICD-10-CM

## 2012-04-02 DIAGNOSIS — F329 Major depressive disorder, single episode, unspecified: Secondary | ICD-10-CM | POA: Insufficient documentation

## 2012-04-02 DIAGNOSIS — Z883 Allergy status to other anti-infective agents status: Secondary | ICD-10-CM | POA: Insufficient documentation

## 2012-04-02 HISTORY — PX: NEVUS EXCISION: SHX5263

## 2012-04-02 HISTORY — DX: Presence of spectacles and contact lenses: Z97.3

## 2012-04-02 HISTORY — PX: BREAST LUMPECTOMY WITH NEEDLE LOCALIZATION: SHX5759

## 2012-04-02 LAB — POCT I-STAT, CHEM 8
Glucose, Bld: 381 mg/dL — ABNORMAL HIGH (ref 70–99)
HCT: 41 % (ref 36.0–46.0)
Hemoglobin: 13.9 g/dL (ref 12.0–15.0)
Potassium: 4.5 mEq/L (ref 3.5–5.1)
Sodium: 138 mEq/L (ref 135–145)

## 2012-04-02 LAB — GLUCOSE, CAPILLARY: Glucose-Capillary: 324 mg/dL — ABNORMAL HIGH (ref 70–99)

## 2012-04-02 SURGERY — BREAST LUMPECTOMY WITH NEEDLE LOCALIZATION
Anesthesia: General | Site: Breast | Laterality: Right | Wound class: Clean

## 2012-04-02 MED ORDER — SODIUM CHLORIDE 0.9 % IJ SOLN: 3.0000 mL | INTRAMUSCULAR | Status: DC | PRN

## 2012-04-02 MED ORDER — SODIUM CHLORIDE 0.9 % IJ SOLN: 3.0000 mL | Freq: Two times a day (BID) | INTRAMUSCULAR | Status: DC

## 2012-04-02 MED ORDER — ONDANSETRON HCL 4 MG/2ML IJ SOLN: 4.0000 mg | Freq: Once | INTRAMUSCULAR | Status: DC | PRN

## 2012-04-02 MED ORDER — LACTATED RINGERS IV SOLN
INTRAVENOUS | Status: DC
  Administered 2012-04-02: 10:00:00 via INTRAVENOUS

## 2012-04-02 MED ORDER — PROPOFOL 10 MG/ML IV BOLUS
INTRAVENOUS | Status: DC | PRN
  Administered 2012-04-02: 150 mg via INTRAVENOUS

## 2012-04-02 MED ORDER — ONDANSETRON HCL 4 MG/2ML IJ SOLN
INTRAMUSCULAR | Status: DC | PRN
  Administered 2012-04-02: 4 mg via INTRAVENOUS

## 2012-04-02 MED ORDER — SODIUM CHLORIDE 0.9 % IV SOLN: 250.0000 mL | INTRAVENOUS | Status: DC | PRN

## 2012-04-02 MED ORDER — ACETAMINOPHEN 10 MG/ML IV SOLN
INTRAVENOUS | Status: DC | PRN
  Administered 2012-04-02: 1000 mg via INTRAVENOUS

## 2012-04-02 MED ORDER — CHLORHEXIDINE GLUCONATE 4 % EX LIQD: 1.0000 "application " | Freq: Once | CUTANEOUS | Status: DC

## 2012-04-02 MED ORDER — INSULIN ASPART 100 UNIT/ML ~~LOC~~ SOLN
8.0000 [IU] | Freq: Once | SUBCUTANEOUS | Status: AC
  Administered 2012-04-02: 8 [IU] via SUBCUTANEOUS

## 2012-04-02 MED ORDER — BUPIVACAINE-EPINEPHRINE 0.5% -1:200000 IJ SOLN
INTRAMUSCULAR | Status: DC | PRN
  Administered 2012-04-02: 13 mL

## 2012-04-02 MED ORDER — CEFAZOLIN SODIUM-DEXTROSE 2-3 GM-% IV SOLR
2.0000 g | INTRAVENOUS | Status: AC
  Administered 2012-04-02: 2 g via INTRAVENOUS

## 2012-04-02 MED ORDER — HYDROCODONE-ACETAMINOPHEN 5-325 MG PO TABS
1.0000 | ORAL_TABLET | Freq: Once | ORAL | Status: AC
  Administered 2012-04-02: 1 via ORAL

## 2012-04-02 MED ORDER — HEPARIN SODIUM (PORCINE) 5000 UNIT/ML IJ SOLN
5000.0000 [IU] | Freq: Once | INTRAMUSCULAR | Status: AC
  Administered 2012-04-02: 5000 [IU] via SUBCUTANEOUS

## 2012-04-02 MED ORDER — SODIUM CHLORIDE 0.9 % IV SOLN: INTRAVENOUS | Status: DC

## 2012-04-02 MED ORDER — FENTANYL CITRATE 0.05 MG/ML IJ SOLN: 25.0000 ug | INTRAMUSCULAR | Status: DC | PRN

## 2012-04-02 MED ORDER — LIDOCAINE HCL (CARDIAC) 20 MG/ML IV SOLN
INTRAVENOUS | Status: DC | PRN
  Administered 2012-04-02: 50 mg via INTRAVENOUS

## 2012-04-02 MED ORDER — ESMOLOL HCL 10 MG/ML IV SOLN
INTRAVENOUS | Status: DC | PRN
  Administered 2012-04-02: 10 mg via INTRAVENOUS

## 2012-04-02 MED ORDER — INSULIN ASPART 100 UNIT/ML ~~LOC~~ SOLN
15.0000 [IU] | Freq: Once | SUBCUTANEOUS | Status: AC
  Administered 2012-04-02: 15 [IU] via SUBCUTANEOUS

## 2012-04-02 MED ORDER — ACETAMINOPHEN 10 MG/ML IV SOLN: 1000.0000 mg | Freq: Once | INTRAVENOUS | Status: DC | PRN

## 2012-04-02 MED ORDER — HYDROMORPHONE HCL PF 1 MG/ML IJ SOLN: 0.2500 mg | INTRAMUSCULAR | Status: DC | PRN

## 2012-04-02 MED ORDER — ONDANSETRON HCL 4 MG/2ML IJ SOLN: 4.0000 mg | Freq: Four times a day (QID) | INTRAMUSCULAR | Status: DC | PRN

## 2012-04-02 MED ORDER — FENTANYL CITRATE 0.05 MG/ML IJ SOLN
INTRAMUSCULAR | Status: DC | PRN
  Administered 2012-04-02: 100 ug via INTRAVENOUS

## 2012-04-02 SURGICAL SUPPLY — 61 items
ADH SKN CLS APL DERMABOND .7 (GAUZE/BANDAGES/DRESSINGS)
APL SKNCLS STERI-STRIP NONHPOA (GAUZE/BANDAGES/DRESSINGS)
APPLIER CLIP 11 MED OPEN (CLIP) ×3
APR CLP MED 11 20 MLT OPN (CLIP) ×2
BANDAGE ELASTIC 6 VELCRO ST LF (GAUZE/BANDAGES/DRESSINGS) IMPLANT
BENZOIN TINCTURE PRP APPL 2/3 (GAUZE/BANDAGES/DRESSINGS) IMPLANT
BLADE SURG 10 STRL SS (BLADE) IMPLANT
BLADE SURG 15 STRL LF DISP TIS (BLADE) ×4 IMPLANT
BLADE SURG 15 STRL SS (BLADE) ×6
CANISTER SUCTION 1200CC (MISCELLANEOUS) ×3 IMPLANT
CHLORAPREP W/TINT 26ML (MISCELLANEOUS) ×4 IMPLANT
CLIP APPLIE 11 MED OPEN (CLIP) ×2 IMPLANT
CLOTH BEACON ORANGE TIMEOUT ST (SAFETY) ×3 IMPLANT
COVER MAYO STAND STRL (DRAPES) ×3 IMPLANT
COVER TABLE BACK 60X90 (DRAPES) ×3 IMPLANT
DECANTER SPIKE VIAL GLASS SM (MISCELLANEOUS) IMPLANT
DERMABOND ADVANCED (GAUZE/BANDAGES/DRESSINGS)
DERMABOND ADVANCED .7 DNX12 (GAUZE/BANDAGES/DRESSINGS) IMPLANT
DEVICE DUBIN W/COMP PLATE 8390 (MISCELLANEOUS) ×3 IMPLANT
DRAIN CHANNEL 19F RND (DRAIN) IMPLANT
DRAIN HEMOVAC 1/8 X 5 (WOUND CARE) IMPLANT
DRAPE LAPAROSCOPIC ABDOMINAL (DRAPES) ×3 IMPLANT
DRAPE UTILITY XL STRL (DRAPES) ×4 IMPLANT
DRSG PAD ABDOMINAL 8X10 ST (GAUZE/BANDAGES/DRESSINGS) IMPLANT
ELECT REM PT RETURN 9FT ADLT (ELECTROSURGICAL) ×3
ELECTRODE REM PT RTRN 9FT ADLT (ELECTROSURGICAL) ×2 IMPLANT
EVACUATOR SILICONE 100CC (DRAIN) IMPLANT
GAUZE SPONGE 4X4 12PLY STRL LF (GAUZE/BANDAGES/DRESSINGS) IMPLANT
GAUZE SPONGE 4X4 16PLY XRAY LF (GAUZE/BANDAGES/DRESSINGS) IMPLANT
GLOVE BIO SURGEON STRL SZ 6.5 (GLOVE) ×1 IMPLANT
GLOVE BIOGEL PI IND STRL 7.0 (GLOVE) IMPLANT
GLOVE BIOGEL PI INDICATOR 7.0 (GLOVE) ×1
GLOVE EUDERMIC 7 POWDERFREE (GLOVE) ×3 IMPLANT
GOWN PREVENTION PLUS XLARGE (GOWN DISPOSABLE) ×3 IMPLANT
GOWN PREVENTION PLUS XXLARGE (GOWN DISPOSABLE) ×3 IMPLANT
KIT MARKER MARGIN INK (KITS) ×3 IMPLANT
NDL HYPO 25X1 1.5 SAFETY (NEEDLE) ×2 IMPLANT
NEEDLE HYPO 25X1 1.5 SAFETY (NEEDLE) ×3 IMPLANT
NS IRRIG 1000ML POUR BTL (IV SOLUTION) ×3 IMPLANT
PACK BASIN DAY SURGERY FS (CUSTOM PROCEDURE TRAY) ×3 IMPLANT
PAD ALCOHOL SWAB (MISCELLANEOUS) ×3 IMPLANT
PENCIL BUTTON HOLSTER BLD 10FT (ELECTRODE) ×3 IMPLANT
PIN SAFETY STERILE (MISCELLANEOUS) IMPLANT
SLEEVE SCD COMPRESS KNEE MED (MISCELLANEOUS) IMPLANT
SPONGE GAUZE 4X4 12PLY (GAUZE/BANDAGES/DRESSINGS) ×3 IMPLANT
SPONGE LAP 18X18 X RAY DECT (DISPOSABLE) IMPLANT
SPONGE LAP 4X18 X RAY DECT (DISPOSABLE) ×3 IMPLANT
STRIP CLOSURE SKIN 1/2X4 (GAUZE/BANDAGES/DRESSINGS) IMPLANT
SUT ETHILON 3 0 FSL (SUTURE) IMPLANT
SUT MNCRL AB 4-0 PS2 18 (SUTURE) ×6 IMPLANT
SUT SILK 2 0 SH (SUTURE) ×3 IMPLANT
SUT VIC AB 2-0 CT1 27 (SUTURE)
SUT VIC AB 2-0 CT1 TAPERPNT 27 (SUTURE) IMPLANT
SUT VIC AB 3-0 SH 27 (SUTURE)
SUT VIC AB 3-0 SH 27X BRD (SUTURE) IMPLANT
SUT VICRYL 3-0 CR8 SH (SUTURE) ×3 IMPLANT
SYR CONTROL 10ML LL (SYRINGE) ×3 IMPLANT
TOWEL OR 17X24 6PK STRL BLUE (TOWEL DISPOSABLE) ×6 IMPLANT
TOWEL OR NON WOVEN STRL DISP B (DISPOSABLE) ×3 IMPLANT
TUBE CONNECTING 20X1/4 (TUBING) ×3 IMPLANT
YANKAUER SUCT BULB TIP NO VENT (SUCTIONS) ×3 IMPLANT

## 2012-04-02 NOTE — Transfer of Care (Signed)
Immediate Anesthesia Transfer of Care Note  Patient: Katherine Carr  Procedure(s) Performed: Procedure(s) with comments: Excision Ductal System Right Breast with Needle Localization (Right) - needle localization at 7:30 at [facility]  Excision Nevus Left Abdominal Wall (Left)  Patient Location: PACU  Anesthesia Type:General  Level of Consciousness: sedated  Airway & Oxygen Therapy: Patient Spontanous Breathing and Patient connected to face mask oxygen  Post-op Assessment: Report given to PACU RN and Post -op Vital signs reviewed and stable  Post vital signs: Reviewed and stable  Complications: No apparent anesthesia complications

## 2012-04-02 NOTE — Anesthesia Procedure Notes (Signed)
Procedure Name: LMA Insertion Date/Time: 04/02/2012 9:55 AM Performed by: Caren Macadam Pre-anesthesia Checklist: Patient identified, Emergency Drugs available, Suction available and Patient being monitored Patient Re-evaluated:Patient Re-evaluated prior to inductionOxygen Delivery Method: Circle System Utilized Preoxygenation: Pre-oxygenation with 100% oxygen Intubation Type: IV induction Ventilation: Mask ventilation without difficulty LMA: LMA inserted LMA Size: 4.0 Number of attempts: 1 Airway Equipment and Method: bite block Placement Confirmation: positive ETCO2 and breath sounds checked- equal and bilateral Tube secured with: Tape Dental Injury: Teeth and Oropharynx as per pre-operative assessment

## 2012-04-02 NOTE — Op Note (Signed)
Patient Name:           Katherine Carr   Date of Surgery:        04/02/2012  Pre op Diagnosis:      Intraductal papilloma right breast, periareolar Neoplastic nevus, left abdominal wall, a 1 cm diameter  Post op Diagnosis:    Same  Procedure:                 Right partial mastectomy with needle localization and margin assessment Excision 1 cm neoplastic nevus left abdominal wall  Surgeon:                     Angelia Mould. Derrell Lolling, M.D., FACS  Assistant:                      None  Operative Indications:   Katherine Carr is a 72 y.o. female. She is referred by Dr. Micheline Maze at the breast center of [facility] for evaluation and management of an intraductal papilloma of the right breast.  This patient lives at [facility] in skilled nursing. She is wheelchair-bound because of diabetic neuropathy. She has spinal stenosis and has had cervical spine surgery. She has obesity, depression, hypertension, asthma, and GERD.  A few years ago she underwent excision of the ductal system left breast for benign disease. She now presents with a one-week history of a palpable mass in the right breast, areolar margin the 2:00 position. She states that she has had whitish discharge off and on for a year and it is occasionally clear, but never bloody. She underwent right breast mammogram and ultrasound and this showed a dilated duct in the area - 8 mm diameter echogenic debris within the duct of the right breast at the 2:00 position, subareolar area. Image guided biopsy shows intraductal papilloma with the usual ductal hyperplasia.  She is motivated to have this area excised, states she does not want it to transform into cancer.She also has a 1 cm raised verrucous pigmented nevus of her left abdominal wall and she would like to have this area excised as well because of its prominence.   Operative Findings:       The localizing wire in the right breast was placed from medial to lateral and went immediately adjacent to  the marker clip and density. The specimen mammogram looked good showing the marker clip and the density and the wire. The nevus of the left abdominal wall was a 1 cm raised verrucous pigmented nevus which was excised with a narrow 1 mm margin.  Procedure in Detail:          Following the placement of the localizing wire the patient was brought to [facility] and taken to the operating room and underwent LMA anesthesia. The right breast and abdominal wall were prepped and draped in a sterile fashion. Surgical time out was performed. Intravenous antibiotics were given. 0.5% Marcaine with epinephrine was used as a local infiltration anesthetic. The mammograms of the right breast with the wire were studied.   I chose to make a circumareolar incision in the right breast at the areolar margin medially. Dissection was carried down into the periareolar area medially and laterally and around the wire. The specimen was removed and marked with silk sutures to orient the pathologist. The specimen mammogram looked good as described above. The wound was irrigated with sterile saline. Hemostasis was excellent. Electrocautery was used. The deeper breast tissues were re-approximated with interrupted 3-0 Vicryl  sutures and the skin closed with a running subcuticular suture of 4-0 Monocryl and Dermabond.  We then turned our attention to excision of the left abdominal wall nevus. A transverse elliptical incision was made. The dissection was carried down through the dermis into subcutaneous tissue and the nevus was completely excised and sent to the lab for routine histology. Hemostasis was excellent. Subcutaneous tissue was re-approximated with 3-0 Vicryl sutures and the skin closed with subcuticular sutures of 4 Monocryl and Dermabond. The patient tolerated both procedures well. EBL 10 cc. Counts correct. Complications none.     Angelia Mould. Derrell Lolling, M.D., FACS General and Minimally Invasive Surgery Breast and Colorectal  Surgery  04/02/2012 10:48 AM

## 2012-04-02 NOTE — Anesthesia Postprocedure Evaluation (Signed)
  Anesthesia Post-op Note  Patient: Katherine Carr  Procedure(s) Performed: Procedure(s) with comments: Excision Ductal System Right Breast with Needle Localization (Right) - needle localization at 7:30 at [facility]  Excision Nevus Left Abdominal Wall (Left)  Patient Location: PACU  Anesthesia Type:General  Level of Consciousness: awake, alert  and oriented  Airway and Oxygen Therapy: Patient Spontanous Breathing  Post-op Pain: none  Post-op Assessment: Post-op Vital signs reviewed, Patient's Cardiovascular Status Stable, Patent Airway and Pain level controlled  Post-op Vital Signs: stable  Complications: No apparent anesthesia complications

## 2012-04-02 NOTE — Interval H&P Note (Signed)
History and Physical Interval Note:  04/02/2012 9:01 AM  Katherine Carr  has presented today for surgery, with the diagnosis of excision ductal system right breast needle localization   The goals and the various methods of treatment have been discussed with the patient and family. After consideration of risks, benefits and other options for treatment, the patient has consented to  Procedure(s) with comments: excision ductal system right breast WITH NEEDLE LOCALIZATION (Right) - needle localization at 7:30 at [facility]  as a surgical intervention .  The patient's history has been reviewed, patient examined today , no change in status, stable for surgery.  I have reviewed the patient's chart and labs.  Questions were answered to the patient's satisfaction.     [name]

## 2012-04-02 NOTE — Anesthesia Preprocedure Evaluation (Signed)
Anesthesia Evaluation  Patient identified by MRN, date of birth, ID band Patient awake    Reviewed: Allergy & Precautions, H&P , NPO status , Patient's Chart, lab work & pertinent test results  Airway Mallampati: II TM Distance: >3 FB     Dental  (+) Teeth Intact and Dental Advisory Given   Pulmonary  breath sounds clear to auscultation        Cardiovascular Rhythm:Regular Rate:Normal     Neuro/Psych    GI/Hepatic   Endo/Other    Renal/GU      Musculoskeletal   Abdominal (+) + obese,   Peds  Hematology   Anesthesia Other Findings   Reproductive/Obstetrics                           Anesthesia Physical Anesthesia Plan  ASA: III  Anesthesia Plan: General   Post-op Pain Management:    Induction: Intravenous  Airway Management Planned: LMA  Additional Equipment:   Intra-op Plan:   Post-operative Plan:   Informed Consent: I have reviewed the patients History and Physical, chart, labs and discussed the procedure including the risks, benefits and alternatives for the proposed anesthesia with the patient or authorized representative who has indicated his/her understanding and acceptance.   Dental advisory given  Plan Discussed with: CRNA and Anesthesiologist  Anesthesia Plan Comments: (R. Breast mass Type 2 DM glucose 381 Chronic neuropathy due to DM, wheelchair bound  Htn GERD Anxiety Obesity Fibromyalgia Multiple allergies  Plan GA with LMA  Kipp Brood)        Anesthesia Quick Evaluation

## 2012-04-03 ENCOUNTER — Telehealth (INDEPENDENT_AMBULATORY_CARE_PROVIDER_SITE_OTHER): Payer: Self-pay | Admitting: General Surgery

## 2012-04-03 ENCOUNTER — Encounter (HOSPITAL_BASED_OUTPATIENT_CLINIC_OR_DEPARTMENT_OTHER): Payer: Self-pay | Admitting: General Surgery

## 2012-04-03 NOTE — Telephone Encounter (Signed)
Spoke with nurse she is aware of this patients  appt po f/u on 04/11/12

## 2012-04-08 ENCOUNTER — Telehealth (INDEPENDENT_AMBULATORY_CARE_PROVIDER_SITE_OTHER): Payer: Self-pay | Admitting: General Surgery

## 2012-04-08 NOTE — Telephone Encounter (Signed)
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 8 mm in greatest dimension. Tumor involves cauterized posterior margin edge.  I called [facility] and spoke to Geronimo Running, LPN the charge nurse. She states that Katherine Carr oes not understand medical information does not have any insight. I briefly discussed the issue and the need for immediate followup in the office with me with a significant family member or power of attorney.  The patient will most likely need reexcision to negative margins. I will discuss this at the next office visit. She has an appointment to see me this coming Friday.  The pathology report has been forwarded to Dr. Burney Gauze, her primary care physician.   Angelia Mould. Derrell Lolling, M.D., [facility] General and Minimally invasive Surgery Breast and Colorectal Surgery Office:   [phone] Pager:   [phone]

## 2012-04-11 ENCOUNTER — Ambulatory Visit (INDEPENDENT_AMBULATORY_CARE_PROVIDER_SITE_OTHER): Payer: PRIVATE HEALTH INSURANCE | Admitting: General Surgery

## 2012-04-11 ENCOUNTER — Encounter (INDEPENDENT_AMBULATORY_CARE_PROVIDER_SITE_OTHER): Payer: Self-pay | Admitting: General Surgery

## 2012-04-11 VITALS — BP 120/70 | HR 78 | Temp 97.6°F | Resp 18 | Ht 63.0 in | Wt 177.2 lb

## 2012-04-11 DIAGNOSIS — D0590 Unspecified type of carcinoma in situ of unspecified breast: Secondary | ICD-10-CM

## 2012-04-11 DIAGNOSIS — D0591 Unspecified type of carcinoma in situ of right breast: Secondary | ICD-10-CM

## 2012-04-11 NOTE — Progress Notes (Signed)
Patient ID: Katherine Carr, female   DOB: Sep 26, 1940, 72 y.o.   MRN: [id number] History: This patient recently underwent a right partial mastectomy with needle localization and margin assessment because of a subareolar papilloma somewhat medially oriented. 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 This was 8 mm diameter area. DCIS was present at the posterior margin. There was no comedonecrosis. No invasive component. She has had no problems with wound healing. She is here with her power of attorney and with her ex-husband. I've discussed the pathologic diagnosis with her and drawn pictures for everyone. I advised her to have this area re-excised. I have offered to refer her to a medical oncologist for a second opinion she declines that. She does not want to have a mastectomy but is very willing to have this area reexcised. She seems to understand all this reasonably well.  Exam: Patient is in a wheelchair. Alert. Speech is slow but she has reasonable insight into her problem. Lungs: clear bilaterally  Heart: Regular rate and rhythm. No ectopy. No murmer. Breasts right breast is examined. Circumareolar incision healing uneventfully. Nipple areolar complex is normal. Some flattening centrally. Secondary to volume loss. No hematoma.  Assessment: Intraductal papilloma and DCIS right breast, subareolar area. DCIS focally positive at the posterior margin. Reexcision advised Diabetes mellitus with disabling neuropathy, wheelchair-bound. Lives at [facility] nursing home Spinal stenosis Obesity Depression Hypertension Asthma GERD  Plan: Scheduled for right lumpectomy with reexcision posterior margin , gen. Anesthesia,  as outpatient. I have discussed indications, details, techniques, and numerous risks of the surgery with the patient, her power of attorney, and her ex-husband. Everyone understands these issues and will  everyone's questions are answered. She agrees with this plan. I intend to refer her to a medical oncologist postop.    Angelia Mould. Derrell Lolling, M.D., [facility] General and Minimally invasive Surgery Breast and Colorectal Surgery Office:   [phone] Pager:   [phone]

## 2012-04-11 NOTE — Patient Instructions (Signed)
Your recent right breast lumpectomy shows an intraductal papilloma and some ductal carcinoma in situ. This is a small area but it was not completely removed. This will need to be reexcised and you will need to be referred to a medical oncologist.  You will be scheduled for a right lumpectomy with reexcision of margins.

## 2012-04-14 ENCOUNTER — Encounter (HOSPITAL_COMMUNITY): Payer: Self-pay

## 2012-04-16 NOTE — H&P (Signed)
Katherine Carr Description: 72 year old female   Provider: [name], MD  MRN: [id number] Department: [facility]            Diagnoses    Carcinoma in situ of breast, right - Primary     233.0             Current Vitals     BP Pulse Temp(Src) Resp Ht Wt   120/70 78 97.6 F (36.4 C) (Temporal) 18 5\' 3"  (1.6 m) 177 lb 3.2 oz (80.377 kg)       BMI             31.4 kg/m2                 History and Physical    Patient ID: Katherine Carr, female DOB: Sep 02, 1940, 72 y.o. MRN: [id number]  History: This patient recently underwent a right partial mastectomy with needle localization and margin assessment because of a subareolar papilloma somewhat medially oriented. 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 This was 8 mm diameter area. DCIS was present at the posterior margin. There was no comedonecrosis. No invasive component.  She has had no problems with wound healing.  She is here with her power of attorney and with her ex-husband. I've discussed the pathologic diagnosis with her and drawn pictures for everyone. I advised her to have this area re-excised. I have offered to refer her to a medical oncologist for a second opinion she declines that. She does not want to have a mastectomy but is very willing to have this area reexcised. She seems to understand all this reasonably well.   Exam: Patient is in a wheelchair. Alert. Speech is slow but she has reasonable insight into her problem.  Lungs: clear bilaterally  Heart: Regular rate and rhythm. No ectopy. No murmer.  Breasts right breast is examined. Circumareolar incision healing uneventfully. Nipple areolar complex is normal. Some flattening centrally. Secondary to volume loss. No hematoma.  Assessment: Intraductal papilloma and DCIS right breast, subareolar area.  DCIS focally positive at the posterior margin.  Reexcision advised  Diabetes mellitus with disabling neuropathy, wheelchair-bound. Lives at [facility]  Spinal stenosis  Obesity  Depression  Hypertension  Asthma  GERD   Plan: Scheduled for right lumpectomy with reexcision posterior margin , gen. Anesthesia, as outpatient.  I have discussed indications, details, techniques, and numerous risks of the surgery with the patient, her power of attorney, and her ex-husband. Everyone understands these issues and will everyone's questions are answered. She agrees with this plan.  I intend to refer her to a medical oncologist postop.   Katherine Carr. Katherine Carr, M.D., [facility]  General and Minimally invasive Surgery  Breast and Colorectal Surgery  Office: [phone]  Pager: [phone]               Not recorded

## 2012-04-18 ENCOUNTER — Encounter (HOSPITAL_COMMUNITY): Payer: Self-pay | Admitting: *Deleted

## 2012-04-18 NOTE — Progress Notes (Signed)
Spoke with Casimiro Needle ( a nurse) at [employer] and he completed the pre-op screening call for patient. Nurse was unable to assess pt for Apnea screening. Nurse was made aware to have pt stop taking Aspirin, Coumadin, Plavix, Effient, and herbal medications such as Melatonin and Voltaren. Do not take any NSAIDs ie: Ibuprofen, Advil, Naproxen or any medication containing Aspirin.

## 2012-04-20 MED ORDER — CHLORHEXIDINE GLUCONATE 4 % EX LIQD: 1.0000 "application " | Freq: Once | CUTANEOUS | Status: DC

## 2012-04-20 MED ORDER — CEFAZOLIN SODIUM-DEXTROSE 2-3 GM-% IV SOLR
2.0000 g | INTRAVENOUS | Status: AC
  Administered 2012-04-21: 2 g via INTRAVENOUS
  Filled 2012-04-20: qty 50

## 2012-04-21 ENCOUNTER — Encounter (INDEPENDENT_AMBULATORY_CARE_PROVIDER_SITE_OTHER): Payer: Self-pay | Admitting: General Surgery

## 2012-04-21 ENCOUNTER — Encounter (HOSPITAL_COMMUNITY)
Admission: RE | Disposition: A | Discharge status: Skilled Nursing Facility | Payer: Self-pay | Source: Ambulatory Visit | Attending: General Surgery

## 2012-04-21 ENCOUNTER — Ambulatory Visit (HOSPITAL_COMMUNITY): Payer: PRIVATE HEALTH INSURANCE

## 2012-04-21 ENCOUNTER — Encounter (HOSPITAL_COMMUNITY): Payer: Self-pay | Admitting: *Deleted

## 2012-04-21 ENCOUNTER — Encounter (HOSPITAL_COMMUNITY): Payer: Self-pay | Admitting: Anesthesiology

## 2012-04-21 ENCOUNTER — Ambulatory Visit (HOSPITAL_COMMUNITY): Payer: PRIVATE HEALTH INSURANCE | Admitting: Anesthesiology

## 2012-04-21 ENCOUNTER — Ambulatory Visit (HOSPITAL_COMMUNITY)
Admission: RE | Admit: 2012-04-21 | Discharge: 2012-04-21 | Disposition: A | Discharge status: Skilled Nursing Facility | Payer: PRIVATE HEALTH INSURANCE | Source: Ambulatory Visit | Attending: General Surgery | Admitting: General Surgery

## 2012-04-21 DIAGNOSIS — I10 Essential (primary) hypertension: Secondary | ICD-10-CM | POA: Insufficient documentation

## 2012-04-21 DIAGNOSIS — G709 Myoneural disorder, unspecified: Secondary | ICD-10-CM | POA: Insufficient documentation

## 2012-04-21 DIAGNOSIS — J4489 Other specified chronic obstructive pulmonary disease: Secondary | ICD-10-CM | POA: Insufficient documentation

## 2012-04-21 DIAGNOSIS — F3289 Other specified depressive episodes: Secondary | ICD-10-CM | POA: Insufficient documentation

## 2012-04-21 DIAGNOSIS — D0590 Unspecified type of carcinoma in situ of unspecified breast: Secondary | ICD-10-CM | POA: Diagnosis present

## 2012-04-21 DIAGNOSIS — E1149 Type 2 diabetes mellitus with other diabetic neurological complication: Secondary | ICD-10-CM | POA: Insufficient documentation

## 2012-04-21 DIAGNOSIS — K219 Gastro-esophageal reflux disease without esophagitis: Secondary | ICD-10-CM | POA: Insufficient documentation

## 2012-04-21 DIAGNOSIS — Z6831 Body mass index (BMI) 31.0-31.9, adult: Secondary | ICD-10-CM | POA: Insufficient documentation

## 2012-04-21 DIAGNOSIS — F329 Major depressive disorder, single episode, unspecified: Secondary | ICD-10-CM | POA: Insufficient documentation

## 2012-04-21 DIAGNOSIS — I739 Peripheral vascular disease, unspecified: Secondary | ICD-10-CM | POA: Insufficient documentation

## 2012-04-21 DIAGNOSIS — E1142 Type 2 diabetes mellitus with diabetic polyneuropathy: Secondary | ICD-10-CM | POA: Insufficient documentation

## 2012-04-21 DIAGNOSIS — F411 Generalized anxiety disorder: Secondary | ICD-10-CM | POA: Insufficient documentation

## 2012-04-21 DIAGNOSIS — J449 Chronic obstructive pulmonary disease, unspecified: Secondary | ICD-10-CM | POA: Insufficient documentation

## 2012-04-21 DIAGNOSIS — IMO0001 Reserved for inherently not codable concepts without codable children: Secondary | ICD-10-CM | POA: Insufficient documentation

## 2012-04-21 DIAGNOSIS — D0591 Unspecified type of carcinoma in situ of right breast: Secondary | ICD-10-CM

## 2012-04-21 DIAGNOSIS — E669 Obesity, unspecified: Secondary | ICD-10-CM | POA: Insufficient documentation

## 2012-04-21 HISTORY — DX: Gastro-esophageal reflux disease without esophagitis: K21.9

## 2012-04-21 HISTORY — DX: Chronic obstructive pulmonary disease, unspecified: J44.9

## 2012-04-21 HISTORY — DX: Unspecified asthma, uncomplicated: J45.909

## 2012-04-21 HISTORY — DX: Occlusion and stenosis of left carotid artery: I65.22

## 2012-04-21 HISTORY — DX: Anxiety disorder, unspecified: F41.9

## 2012-04-21 HISTORY — PX: BREAST LUMPECTOMY: SHX2

## 2012-04-21 HISTORY — DX: Fibromyalgia: M79.7

## 2012-04-21 HISTORY — DX: Fatty (change of) liver, not elsewhere classified: K76.0

## 2012-04-21 LAB — CBC
HCT: 40.9 % (ref 36.0–46.0)
Hemoglobin: 14.5 g/dL (ref 12.0–15.0)
MCV: 86.7 fL (ref 78.0–100.0)
RBC: 4.72 MIL/uL (ref 3.87–5.11)
WBC: 5.2 10*3/uL (ref 4.0–10.5)

## 2012-04-21 LAB — SURGICAL PCR SCREEN
MRSA, PCR: NEGATIVE
Staphylococcus aureus: NEGATIVE

## 2012-04-21 LAB — GLUCOSE, CAPILLARY
Glucose-Capillary: 233 mg/dL — ABNORMAL HIGH (ref 70–99)
Glucose-Capillary: 214 mg/dL — ABNORMAL HIGH (ref 70–99)

## 2012-04-21 LAB — COMPREHENSIVE METABOLIC PANEL
ALT: 20 U/L (ref 0–35)
AST: 20 U/L (ref 0–37)
Albumin: 3.5 g/dL (ref 3.5–5.2)
Alkaline Phosphatase: 152 U/L — ABNORMAL HIGH (ref 39–117)
BUN: 17 mg/dL (ref 6–23)
CO2: 31 mEq/L (ref 19–32)
Calcium: 9.8 mg/dL (ref 8.4–10.5)
Chloride: 97 mEq/L (ref 96–112)
Creatinine, Ser: 0.52 mg/dL (ref 0.50–1.10)
GFR calc Af Amer: >90 mL/min (ref >90)
GFR calc non Af Amer: >90 mL/min (ref >90)
Glucose, Bld: 234 mg/dL — ABNORMAL HIGH (ref 70–99)
Potassium: 4.1 mEq/L (ref 3.5–5.1)
Sodium: 137 mEq/L (ref 135–145)
Total Bilirubin: 0.5 mg/dL (ref 0.3–1.2)
Total Protein: 7.2 g/dL (ref 6.0–8.3)

## 2012-04-21 SURGERY — BREAST LUMPECTOMY
Anesthesia: General | Site: Breast | Laterality: Right | Wound class: Clean

## 2012-04-21 MED ORDER — METOPROLOL SUCCINATE ER 25 MG PO TB24
25.0000 mg | ORAL_TABLET | Freq: Every day | ORAL | Status: AC
  Administered 2012-04-21: 25 mg via ORAL
  Filled 2012-04-21: qty 1

## 2012-04-21 MED ORDER — LACTATED RINGERS IV SOLN
INTRAVENOUS | Status: DC | PRN
  Administered 2012-04-21: 09:00:00 via INTRAVENOUS

## 2012-04-21 MED ORDER — BUPIVACAINE-EPINEPHRINE PF 0.25-1:200000 % IJ SOLN
INTRAMUSCULAR | Status: DC | PRN
  Administered 2012-04-21: 10 mL

## 2012-04-21 MED ORDER — HYDROMORPHONE HCL PF 1 MG/ML IJ SOLN
INTRAMUSCULAR | Status: AC
  Filled 2012-04-21: qty 1

## 2012-04-21 MED ORDER — ARTIFICIAL TEARS OP OINT
TOPICAL_OINTMENT | OPHTHALMIC | Status: DC | PRN
  Administered 2012-04-21: 1 via OPHTHALMIC

## 2012-04-21 MED ORDER — 0.9 % SODIUM CHLORIDE (POUR BTL) OPTIME
TOPICAL | Status: DC | PRN
  Administered 2012-04-21: 1000 mL

## 2012-04-21 MED ORDER — MUPIROCIN 2 % EX OINT
TOPICAL_OINTMENT | CUTANEOUS | Status: AC
  Administered 2012-04-21: 1 via NASAL
  Filled 2012-04-21: qty 22

## 2012-04-21 MED ORDER — MUPIROCIN 2 % EX OINT
TOPICAL_OINTMENT | Freq: Two times a day (BID) | CUTANEOUS | Status: DC
  Filled 2012-04-21: qty 22

## 2012-04-21 MED ORDER — ONDANSETRON HCL 4 MG/2ML IJ SOLN
INTRAMUSCULAR | Status: DC | PRN
  Administered 2012-04-21: 4 mg via INTRAVENOUS

## 2012-04-21 MED ORDER — LIDOCAINE HCL (CARDIAC) 20 MG/ML IV SOLN
INTRAVENOUS | Status: DC | PRN
  Administered 2012-04-21: 30 mg via INTRAVENOUS

## 2012-04-21 MED ORDER — PROPOFOL 10 MG/ML IV BOLUS
INTRAVENOUS | Status: DC | PRN
  Administered 2012-04-21: 170 mg via INTRAVENOUS

## 2012-04-21 MED ORDER — BUPIVACAINE-EPINEPHRINE PF 0.25-1:200000 % IJ SOLN
INTRAMUSCULAR | Status: AC
  Filled 2012-04-21: qty 30

## 2012-04-21 MED ORDER — EPHEDRINE SULFATE 50 MG/ML IJ SOLN
INTRAMUSCULAR | Status: DC | PRN
  Administered 2012-04-21 (×3): 5 mg via INTRAVENOUS

## 2012-04-21 MED ORDER — HYDROMORPHONE HCL PF 1 MG/ML IJ SOLN
0.2500 mg | INTRAMUSCULAR | Status: DC | PRN
  Administered 2012-04-21 (×4): 0.5 mg via INTRAVENOUS

## 2012-04-21 MED ORDER — FENTANYL CITRATE 0.05 MG/ML IJ SOLN
INTRAMUSCULAR | Status: DC | PRN
Start: 2012-04-21 — End: 2012-04-21
  Administered 2012-04-21 (×2): 25 ug via INTRAVENOUS
  Administered 2012-04-21: 50 ug via INTRAVENOUS

## 2012-04-21 MED ORDER — LIDOCAINE-EPINEPHRINE (PF) 1 %-1:200000 IJ SOLN
INTRAMUSCULAR | Status: AC
  Filled 2012-04-21: qty 10

## 2012-04-21 SURGICAL SUPPLY — 43 items
ADH SKN CLS APL DERMABOND .7 (GAUZE/BANDAGES/DRESSINGS) ×1
APPLIER CLIP 9.375 MED OPEN (MISCELLANEOUS)
APR CLP MED 9.3 20 MLT OPN (MISCELLANEOUS)
CANISTER SUCTION 2500CC (MISCELLANEOUS) ×2 IMPLANT
CHLORAPREP W/TINT 26ML (MISCELLANEOUS) ×2 IMPLANT
CLIP APPLIE 9.375 MED OPEN (MISCELLANEOUS) IMPLANT
CLOTH BEACON ORANGE TIMEOUT ST (SAFETY) ×2 IMPLANT
CONT SPEC STER OR (MISCELLANEOUS) IMPLANT
COVER PROBE W GEL 5X96 (DRAPES) IMPLANT
COVER SURGICAL LIGHT HANDLE (MISCELLANEOUS) ×2 IMPLANT
DECANTER SPIKE VIAL GLASS SM (MISCELLANEOUS) ×2 IMPLANT
DERMABOND ADVANCED (GAUZE/BANDAGES/DRESSINGS) ×1
DERMABOND ADVANCED .7 DNX12 (GAUZE/BANDAGES/DRESSINGS) ×1 IMPLANT
DEVICE DUBIN SPECIMEN MAMMOGRA (MISCELLANEOUS) IMPLANT
DRAPE CHEST BREAST 15X10 FENES (DRAPES) ×2 IMPLANT
DRAPE UTILITY 15X26 W/TAPE STR (DRAPE) ×4 IMPLANT
ELECT CAUTERY BLADE 6.4 (BLADE) ×2 IMPLANT
ELECT REM PT RETURN 9FT ADLT (ELECTROSURGICAL) ×2
ELECTRODE REM PT RTRN 9FT ADLT (ELECTROSURGICAL) ×1 IMPLANT
GLOVE BIO SURGEON STRL SZ7.5 (GLOVE) ×1 IMPLANT
GLOVE BIOGEL PI IND STRL 7.5 (GLOVE) IMPLANT
GLOVE BIOGEL PI INDICATOR 7.5 (GLOVE) ×3
GLOVE ECLIPSE 7.5 STRL STRAW (GLOVE) ×1 IMPLANT
GLOVE EUDERMIC 7 POWDERFREE (GLOVE) ×4 IMPLANT
GOWN PREVENTION PLUS XLARGE (GOWN DISPOSABLE) ×2 IMPLANT
GOWN STRL NON-REIN LRG LVL3 (GOWN DISPOSABLE) ×4 IMPLANT
KIT BASIN OR (CUSTOM PROCEDURE TRAY) ×2 IMPLANT
KIT MARKER MARGIN INK (KITS) ×1 IMPLANT
KIT ROOM TURNOVER OR (KITS) ×2 IMPLANT
NDL HYPO 25GX1X1/2 BEV (NEEDLE) ×1 IMPLANT
NEEDLE HYPO 25GX1X1/2 BEV (NEEDLE) ×2 IMPLANT
NS IRRIG 1000ML POUR BTL (IV SOLUTION) ×2 IMPLANT
PACK GENERAL/GYN (CUSTOM PROCEDURE TRAY) ×2 IMPLANT
PAD ARMBOARD 7.5X6 YLW CONV (MISCELLANEOUS) ×4 IMPLANT
SPONGE LAP 4X18 X RAY DECT (DISPOSABLE) ×2 IMPLANT
STAPLER VISISTAT 35W (STAPLE) ×2 IMPLANT
SUT MNCRL AB 4-0 PS2 18 (SUTURE) ×2 IMPLANT
SUT SILK 2 0 SH (SUTURE) IMPLANT
SUT VIC AB 3-0 SH 18 (SUTURE) ×2 IMPLANT
SYR CONTROL 10ML LL (SYRINGE) ×2 IMPLANT
TOWEL OR 17X24 6PK STRL BLUE (TOWEL DISPOSABLE) ×2 IMPLANT
TOWEL OR 17X26 10 PK STRL BLUE (TOWEL DISPOSABLE) ×2 IMPLANT
WATER STERILE IRR 1000ML POUR (IV SOLUTION) IMPLANT

## 2012-04-21 NOTE — Anesthesia Preprocedure Evaluation (Addendum)
Anesthesia Evaluation  Patient identified by MRN, date of birth, ID band Patient awake    Reviewed: Allergy & Precautions, H&P , NPO status , Patient's Chart, lab work & pertinent test results  Airway Mallampati: IV TM Distance: >3 FB Neck ROM: Limited  Mouth opening: Limited Mouth Opening  Dental  (+) Dental Advisory Given and Poor Dentition   Pulmonary asthma , COPD breath sounds clear to auscultation        Cardiovascular hypertension, + Peripheral Vascular Disease Rhythm:Regular Rate:Normal     Neuro/Psych  Headaches, Anxiety Depression  Neuromuscular disease    GI/Hepatic GERD-  ,  Endo/Other  diabetes  Renal/GU      Musculoskeletal  (+) Fibromyalgia -  Abdominal (+) + obese,   Peds  Hematology   Anesthesia Other Findings   Reproductive/Obstetrics                          Anesthesia Physical Anesthesia Plan  ASA: III  Anesthesia Plan: General   Post-op Pain Management:    Induction: Intravenous  Airway Management Planned: LMA  Additional Equipment:   Intra-op Plan:   Post-operative Plan: Extubation in OR  Informed Consent: I have reviewed the patients History and Physical, chart, labs and discussed the procedure including the risks, benefits and alternatives for the proposed anesthesia with the patient or authorized representative who has indicated his/her understanding and acceptance.     Plan Discussed with: CRNA and Surgeon  Anesthesia Plan Comments:         Anesthesia Quick Evaluation

## 2012-04-21 NOTE — Progress Notes (Signed)
This note also relates to the following rows which could not be included: Pulse Rate - Cannot attach notes to unvalidated device data ECG Heart Rate - Cannot attach notes to unvalidated device data Resp - Cannot attach notes to unvalidated device data SpO2 - Cannot attach notes to unvalidated device data   Waiting on Pt's driver to come with wheelchair and clothes

## 2012-04-21 NOTE — Transfer of Care (Signed)
Immediate Anesthesia Transfer of Care Note  Patient: Katherine Carr  Procedure(s) Performed: Procedure(s): Right breast LUMPECTOMY with re-excision of margins (Right)  Patient Location: PACU  Anesthesia Type:General  Level of Consciousness: responds to stimulation  Airway & Oxygen Therapy: Patient Spontanous Breathing and Patient connected to nasal cannula oxygen  Post-op Assessment: Report given to PACU RN and Post -op Vital signs reviewed and stable  Post vital signs: Reviewed and stable  Complications: No apparent anesthesia complications

## 2012-04-21 NOTE — Op Note (Signed)
Patient Name:           Katherine Carr   Date of Surgery:        04/21/2012  Pre op Diagnosis:      Ductal carcinoma in situ right breast  Post op Diagnosis:    Same  Procedure:                 Right partial mastectomy with re-excision of posterior margin  Surgeon:                     Angelia Mould. Derrell Lolling, M.D., FACS  Assistant:                      None  Operative Indications:   his patient recently underwent a right partial mastectomy with needle localization and margin assessment because of a subareolar papilloma somewhat medially oriented. 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 This was 8 mm diameter area. receptor positive,DCIS was present at the posterior margin. There was no comedonecrosis. No invasive component.  She returned to my office following that surgery with  her power of attorney and with her ex-husband. I've discussed the pathologic diagnosis with her and drawn pictures for everyone. I advised her to have this area re-excised. I have offered to refer her to a medical oncologist for a second opinion she declines that. She does not want to have a mastectomy but is very willing to have this area reexcised. She seems to understand all this reasonably well. Her case has been discussed at tumor board, and our tentative treatment plan is lumpectomy and antiestrogen therapy.   Operative Findings:       The breast tissue in the subareolar area was excised back to clean soft posterior  margin. Orientation was fairly straightforward. There was no infection.  Procedure in Detail:          Following the induction of an LMA general anesthetic the patient's right breast was prepped and draped in a sterile fashion and intravenous antibiotics were given. Surgical time out performed. 0.25% Marcaine with epinephrine was used for local infiltration anesthetic. Using a knife I reopened the medially positioned  circumareolar incision and dissected down into these the breast tissue. I dissected fairly widely in all directions so as to get completely around this area and took  the dissection down into the softer, deeper breast tissue taking about a 1 cm margin. The specimen was removed and marked with ink to orient the pathologist to the new posterior margin. The specimen was sent to the lab for routine histology. Hemostasis was excellent and achieved with electrocautery. It was irrigated with saline. The breast tissues were closed in several layers with interrupted sutures of 3-0 Vicryl and the skin closed with a running subcuticular suture of 0 Monocryl and Dermabond. The patient tolerated the procedure well and taken to recovery room in stable condition. EBL 10 cc the counts correct. Complications none.     Angelia Mould. Derrell Lolling, M.D., FACS General and Minimally Invasive Surgery Breast and Colorectal Surgery  04/21/2012 10:25 AM

## 2012-04-21 NOTE — Anesthesia Postprocedure Evaluation (Signed)
  Anesthesia Post-op Note  Patient: Katherine Carr  Procedure(s) Performed: Procedure(s): Right breast LUMPECTOMY with re-excision of margins (Right)  Patient Location: PACU  Anesthesia Type:General  Level of Consciousness: awake and alert   Airway and Oxygen Therapy: Patient Spontanous Breathing  Post-op Pain: mild  Post-op Assessment: Post-op Vital signs reviewed, Patient's Cardiovascular Status Stable, Respiratory Function Stable, Patent Airway, No signs of Nausea or vomiting and Pain level controlled  Post-op Vital Signs: stable  Complications: No apparent anesthesia complications

## 2012-04-21 NOTE — Interval H&P Note (Signed)
History and Physical Interval Note:  04/21/2012 9:25 AM  Katherine Carr  has presented today for surgery, with the diagnosis of right breast DCIS  The goals and the  various methods of treatment have been discussed with the patient and family. After consideration of risks, benefits and other options for treatment, the patient has consented to  Procedure(s): Right breast LUMPECTOMY with re-excision of margins (Right) as a surgical intervention .  The patient's history has been reviewed, patient examined today, no change in status, stable for surgery.  I have reviewed the patient's chart and labs.  Questions were answered to the patient's satisfaction.     [name]

## 2012-04-21 NOTE — Preoperative (Signed)
Beta Blockers   Reason not to administer Beta Blockers:Not Applicable 

## 2012-04-21 NOTE — Anesthesia Procedure Notes (Addendum)
Procedure Name: LMA Insertion Date/Time: 04/21/2012 9:43 AM Performed by: Katherine Carr Pre-anesthesia Checklist: Patient identified, Emergency Drugs available, Suction available and Patient being monitored Patient Re-evaluated:Patient Re-evaluated prior to inductionOxygen Delivery Method: Circle system utilized Preoxygenation: Pre-oxygenation with 100% oxygen Intubation Type: IV induction LMA: LMA inserted LMA Size: 4.0 Number of attempts: 1 Placement Confirmation: positive ETCO2 and breath sounds checked- equal and bilateral Tube secured with: Tape Dental Injury: Teeth and Oropharynx as per pre-operative assessment

## 2012-04-22 ENCOUNTER — Telehealth (INDEPENDENT_AMBULATORY_CARE_PROVIDER_SITE_OTHER): Payer: Self-pay

## 2012-04-22 ENCOUNTER — Encounter (HOSPITAL_COMMUNITY): Payer: Self-pay | Admitting: General Surgery

## 2012-04-22 DIAGNOSIS — D0591 Unspecified type of carcinoma in situ of right breast: Secondary | ICD-10-CM

## 2012-04-22 NOTE — Telephone Encounter (Signed)
Message copied by Ivory Broad on Tue Apr 22, 2012  3:43 PM ------      Message from: [name]      Created: Tue Apr 22, 2012 10:48 AM       Call radiology reports to patient.Spake to or and after [facility]m that no residual cancer was found and no further surgery will be required. She will need to be referred to medical oncology, either Dr. Darnelle Catalan or  or Dr. Welton Flakes. ------

## 2012-04-22 NOTE — Telephone Encounter (Signed)
I called and notified the pt that there was no residual cancer.  No additional surgery needs to be done.  We will refer her to the medical oncologist.  They will call with an appointment.

## 2012-05-12 ENCOUNTER — Encounter (INDEPENDENT_AMBULATORY_CARE_PROVIDER_SITE_OTHER): Payer: Self-pay | Admitting: General Surgery

## 2012-05-12 ENCOUNTER — Telehealth: Payer: Self-pay | Admitting: *Deleted

## 2012-05-12 ENCOUNTER — Ambulatory Visit (INDEPENDENT_AMBULATORY_CARE_PROVIDER_SITE_OTHER): Payer: PRIVATE HEALTH INSURANCE | Admitting: General Surgery

## 2012-05-12 VITALS — BP 122/72 | HR 76 | Temp 97.0°F | Resp 16 | Ht 64.0 in | Wt 190.0 lb

## 2012-05-12 DIAGNOSIS — D241 Benign neoplasm of right breast: Secondary | ICD-10-CM

## 2012-05-12 DIAGNOSIS — D0591 Unspecified type of carcinoma in situ of right breast: Secondary | ICD-10-CM

## 2012-05-12 DIAGNOSIS — D249 Benign neoplasm of unspecified breast: Secondary | ICD-10-CM

## 2012-05-12 DIAGNOSIS — D0590 Unspecified type of carcinoma in situ of unspecified breast: Secondary | ICD-10-CM

## 2012-05-12 NOTE — Telephone Encounter (Signed)
Per GCM to schedule the pt w/ [facility] for this d/t slot....td

## 2012-05-12 NOTE — Patient Instructions (Signed)
You appear to be healing from the right partial mastectomy surgery without any obvious surgical complications. All of the cancer has been removed from your breast.  You will not need any more surgery.  You will be referred to a medical oncologist to discuss if other treatments will be indicated.  Return to see Dr. Derrell Lolling in 5 months.  We plan to get bilateral mammograms in approximately one year.

## 2012-05-12 NOTE — Progress Notes (Signed)
Patient ID: Katherine Carr, female   DOB: August 28, 1940, 72 y.o.   MRN: [id number] History:   his patient recently underwent a right partial mastectomy with needle localization and margin assessment because of a subareolar papilloma somewhat medially oriented. 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 This was 8 mm diameter area. receptor positive,DCIS was present at the posterior margin. There was no comedonecrosis. No invasive component.  She returned to my office following that surgery with her power of attorney and with her ex-husband. I've discussed the pathologic diagnosis with her and drawn pictures for everyone. I advised her to have this area re-excised. I have offered to refer her to a medical oncologist for a second opinion she declines that. She does not want to have a mastectomy but is very willing to have this area reexcised. On 04/21/2012 she underwent reexcision of her margins. The final pathology report shows another papilloma and some atypical ductal hyperplasia. There was no residual carcinoma. I discussed this with her and her caregiver from the nursing home. She has had no significant wound problems. She has not seen an oncologist at.  Exam: Patient is in a wheelchair. She is alert. She has a moderate insight.   the caregiver is with her. Right breast wound, circumareolar incision is healing without signs of infection, hematoma, or seroma. There is some volume loss centrally and some also contour centrally. She is nontender.  Assessment ductal carcinoma in situ, receptor positive associated with intraductal papilloma, right breast retroareolar area. Negative margins following reexcision  Plan: I gave her a copy of the pathology report. Advised to see a medical oncologist to consider antiestrogen therapy I told her I did not think they would offer her radiation therapy She agrees to see a medical oncologist  to discuss these issues. Return to see me in 5 months Mammograms in one year.   Angelia Mould. Derrell Lolling, M.D., [facility] General and Minimally invasive Surgery Breast and Colorectal Surgery Office:   [phone] Pager:   [phone]

## 2012-05-12 NOTE — Telephone Encounter (Signed)
Left message for pt to return my call so I can schedule an appt for her w/ Dr. Darnelle Catalan.

## 2012-05-12 NOTE — Telephone Encounter (Signed)
Pt was a surgeons office and [name] called

## 2012-05-19 ENCOUNTER — Other Ambulatory Visit: Payer: Self-pay | Admitting: *Deleted

## 2012-05-19 DIAGNOSIS — C50211 Malignant neoplasm of upper-inner quadrant of right female breast: Secondary | ICD-10-CM | POA: Insufficient documentation

## 2012-05-21 ENCOUNTER — Encounter: Payer: Self-pay | Admitting: Family

## 2012-05-21 ENCOUNTER — Encounter: Payer: Self-pay | Admitting: Oncology

## 2012-05-21 ENCOUNTER — Ambulatory Visit: Payer: PRIVATE HEALTH INSURANCE

## 2012-05-21 ENCOUNTER — Other Ambulatory Visit (HOSPITAL_BASED_OUTPATIENT_CLINIC_OR_DEPARTMENT_OTHER): Payer: PRIVATE HEALTH INSURANCE | Admitting: Lab

## 2012-05-21 ENCOUNTER — Ambulatory Visit (HOSPITAL_BASED_OUTPATIENT_CLINIC_OR_DEPARTMENT_OTHER): Payer: PRIVATE HEALTH INSURANCE | Admitting: Family

## 2012-05-21 ENCOUNTER — Ambulatory Visit: Payer: Self-pay | Admitting: Oncology

## 2012-05-21 VITALS — BP 107/77 | HR 96 | Temp 97.9°F | Resp 20 | Ht 64.0 in | Wt 191.0 lb

## 2012-05-21 DIAGNOSIS — D0590 Unspecified type of carcinoma in situ of unspecified breast: Secondary | ICD-10-CM

## 2012-05-21 DIAGNOSIS — C50911 Malignant neoplasm of unspecified site of right female breast: Secondary | ICD-10-CM

## 2012-05-21 DIAGNOSIS — C50211 Malignant neoplasm of upper-inner quadrant of right female breast: Secondary | ICD-10-CM

## 2012-05-21 DIAGNOSIS — D249 Benign neoplasm of unspecified breast: Secondary | ICD-10-CM

## 2012-05-21 DIAGNOSIS — N6089 Other benign mammary dysplasias of unspecified breast: Secondary | ICD-10-CM

## 2012-05-21 LAB — COMPREHENSIVE METABOLIC PANEL (CC13)
ALT: 37 U/L (ref 0–55)
CO2: 29 mEq/L (ref 22–29)
Calcium: 9.3 mg/dL (ref 8.4–10.4)
Chloride: 97 mEq/L — ABNORMAL LOW (ref 98–107)
Potassium: 4.3 mEq/L (ref 3.5–5.1)
Sodium: 136 mEq/L (ref 136–145)
Total Protein: 7 g/dL (ref 6.4–8.3)

## 2012-05-21 LAB — CBC WITH DIFFERENTIAL/PLATELET
BASO%: 0.4 % (ref 0.0–2.0)
HCT: 43 % (ref 34.8–46.6)
MCHC: 34.6 g/dL (ref 31.5–36.0)
MONO#: 0.3 10*3/uL (ref 0.1–0.9)
RBC: 4.65 10*6/uL (ref 3.70–5.45)
RDW: 14.2 % (ref 11.2–14.5)
WBC: 5.4 10*3/uL (ref 3.9–10.3)
lymph#: 1.5 10*3/uL (ref 0.9–3.3)

## 2012-05-21 MED ORDER — ANASTROZOLE 1 MG PO TABS: 1.0000 mg | ORAL_TABLET | Freq: Every day | ORAL | Status: DC

## 2012-05-21 NOTE — Progress Notes (Signed)
Checked in new pt with no financial concerns. °

## 2012-05-21 NOTE — Patient Instructions (Addendum)
Please contact us at [phone] if you have any questions or concerns.  Please continue to do well and enjoy life!!!  Get plenty of rest, drink plenty of water, exercise daily (upper body), eat a balanced diet.

## 2012-05-21 NOTE — Progress Notes (Signed)
[facility], Katherine Carr & Katherine Carr. [facility]  [phone] [phone]  MEDICAL ONCOLOGY NEW PATIENT EVALUATION    ID: Katherine Carr   DOB: 1940/05/11  MR#: [id number]  JYN#:[id number]   PCP: Katherine Housekeeper, MD SU: Katherine Carr, Carr.D. OTHER MD: Dr. Sharl Carr, Dr. Lolly Carr, Dr. Luciana Carr, Dr. Danielle Carr, Dr. Pennie Carr, Dr. Evette Carr    HISTORY OF PRESENT ILLNESS: The patient states that she felt a "knot" in her right breast in 02/2012 and requested to have a mammogram.  The patient had a unilateral right digital diagnostic mammogram on 03/06/2012 which showed a subtle superficial focal density over the region of the patient's palpable abnormality.  The remainder of the exam was unchanged.  The patient underwent a right breast ultrasound on the same day which showed a 4 x 8 x 8 mm intraductal mass within the superficial duct at the 2:00 position of the right periareolar region  2 cm from the nipple corresponding to patient's palpable abnormality.  The patient had a right needle core breast biopsy at the 2:00 position, 2 cm from the nipple on 03/06/2012 which showed intraductal papilloma with usual ductal hyperplasia.  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  Ductal carcinoma in situ measures 0.8 cm in greatest dimension.  The tumor involves cauterized posterior margin age.  Other margins are negative.  ER 98% positive, stage 0, pTis, pNX, MX.  Left abdominal wall nevus shows seborrheic keratosis no dysplasia or malignancy identified.  The patient has a history of left breast needle localized lumpectomy on 10/10/2008 which showed intraductal papillomas, focally transected at 8 margin of resection.  Fibrocystic changes.  Usual ductal hyperplasia.   INTERVAL HISTORY: Dr. Darnelle Carr and I saw Katherine Carr today for  medical oncology consult since  her definitive surgery on 04/02/2012.  She is accompanied for today's office consultation by [name], her medical power of attorney that may be reached at [phone] and [facility] CNA, Katherine Carr.  Research Nurse Katherine Donning, RN met with the patient to discuss Carr-43 protocol study.  The patient declined to be a participant in the protocol study at this time.  [name] higher is establishing herself with Dr. Darrall Carr service today.  REVIEW OF SYSTEMS: A 10 point review of systems was completed and is negative.  She states that she does not have any complaints and she had her surgery.  The patient denies any current symptomatology.     PAST MEDICAL HISTORY: Past Medical History  Diagnosis Date  . Depression   . Diabetes mellitus   . Hypertension   . Peripheral neuropathy   . Diverticulosis   . Urinary incontinence   . Herpes   . Ovarian cyst   . H/O myomectomy   . Fibrous breast lumps   . Wears glasses   . Fibromyalgia   . GERD (gastroesophageal reflux disease)   . COPD (chronic obstructive pulmonary disease)   . Asthma     bronchial  . Fatty liver     hx: of  . Anxiety   . Left carotid stenosis     Hx: of    PAST SURGICAL HISTORY: Past Surgical History  Procedure Laterality Date  . Back surgery      lower  . Hand surgery      bilateral carpal tunnel surgery  . Cesarean section    . Abdominal hysterectomy    .  Myomectomy    . Breast lumpectomy with needle localization Right 04/02/2012    Procedure: Excision Ductal System Right Breast with Needle Localization;  Surgeon: Katherine Mention, MD;  Location: [facility];  Service: General;  Laterality: Right;  needle localization at 7:30 at [facility]   . Nevus excision Left 04/02/2012    Procedure: Excision Nevus Left Abdominal Wall;  Surgeon: Katherine Mention, MD;  Location: [facility];  Service: General;  Laterality: Left;  . Breast lumpectomy Right 04/21/2012    Procedure: Right  breast LUMPECTOMY with re-excision of margins;  Surgeon: Katherine Mention, MD;  Location: [facility] OR;  Service: General;  Laterality: Right;    FAMILY HISTORY Family History  Problem Relation Age of Onset  . Heart disease Mother   . Heart disease Brother   . Diabetes Brother   . Arthritis Sister   . Stroke Sister   . Cirrhosis Brother   . Cancer Brother     Bladder cancer  She has a sister Katherine Carr who has a history of having arthritis and a stroke.  She has a brother named Katherine Carr who had heart disease and diabetes.  She has another brother Katherine Carr who had cirrhosis and bladder cancer  GYNECOLOGIC HISTORY: Gravida 1, para 1, age of menarche 65, first live birth at the age of 18, last menstrual period in 1974, the patient has history of estradiol hormone replacement from 2001 until this year (2014).  SOCIAL HISTORY: Katherine Carr was born in [place]) [place], [place] and got married to her first husband when she was 65 years old.  She had her first and only child with her first husband.  Her son Katherine Carr, is a Biochemist, clinical in the [employer] and stationed in [place].  She was married for a second time when she was 72 years old and married for the third and last time when she was 72 years old.  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  She has 2 teenage granddaughters Katherine Carr that is 44 years old and Katherine Carr that is 71 years old.  They reside in [place] with her father.  Ms. 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     ADVANCED DIRECTIVES: The patient states that she wishes to be a DO NOT RESUSCITATE and the official document is on file with [facility].  Her medical power of attorney is Katherine Carr who may be reached at [phone].  HEALTH MAINTENANCE: History  Substance Use Topics  . Smoking  status: Former Smoker -- 20 years    Types: Cigarettes    Quit date: 08/15/1996  . Smokeless tobacco: Not on file     Comment: smoked 2-3 cigarettes/day  . Alcohol Use: No    Colonoscopy: 2012 PAP: 1971 Bone density: 2012 Lipid panel: Not on file  Allergies  Allergen Reactions  . Sertraline Hcl Hives  . Codeine Hives and Swelling  . Cyclobenzaprine Hcl Hives and Swelling  . Fentanyl     REACTION: PANIC ATTACKS  . Furosemide     REACTION: face was swollen and hand  . Gadolinium Derivatives Swelling  . Hydrocodone-Acetaminophen   . Iohexol      Code: SOB, Desc: xray dye, Onset Date: [id number]   . Levofloxacin     REACTION: PANIC ATTACK,DIARRHEA,EYES DISCOLORD  . Lorazepam  Nausea And Vomiting  . Lubiprostone     REACTION: REALLY BAD NAUSEA  . Metformin Hives and Swelling  . Metoclopramide Nausea And Vomiting and Swelling  . Morphine     REACTION: itching, panic attacks  . Oxycodone Hives and Swelling  . Promethazine Hcl     REACTION: TRAMA ANDJITTERS IN MY BODY  . Rosiglitazone Hives and Swelling  . Sertraline Hcl Hives and Swelling  . Verapamil Hives and Nausea And Vomiting    Current Outpatient Prescriptions  Medication Sig Dispense Refill  . ALPRAZolam (XANAX) 0.5 MG tablet Take 0.5 mg by mouth 2 (two) times daily.       Marland Kitchen ALPRAZolam (XANAX) 0.5 MG tablet Take 0.5 mg by mouth daily as needed for anxiety.      Marland Kitchen alum & mag hydroxide-simeth (MAALOX/MYLANTA) 200-200-20 MG/5ML suspension Take 30 mLs by mouth every 4 (four) hours as needed for indigestion.      . ARIPiprazole (ABILIFY) 5 MG tablet Take 1 tablet (5 mg total) by mouth at bedtime.  30 tablet  2  . atorvastatin (LIPITOR) 10 MG tablet Take 10 mg by mouth at bedtime.      . bisacodyl (DULCOLAX) 10 MG suppository Place 10 mg rectally daily as needed for constipation.       . Cholecalciferol (VITAMIN D3) 1000 UNITS CAPS Take 2,000 Units by mouth daily.      . diclofenac sodium (VOLTAREN) 1 % GEL Apply 4 g  topically 3 (three) times daily as needed (joint pain).      . DULoxetine (CYMBALTA) 60 MG capsule Take 60 mg by mouth 2 (two) times daily.      . fluticasone (FLONASE) 50 MCG/ACT nasal spray Place 2 sprays into the nose daily as needed for rhinitis or allergies.      Marland Kitchen gabapentin (NEURONTIN) 600 MG tablet Take 600 mg by mouth 3 (three) times daily.        Marland Kitchen HUMALOG 100 UNIT/ML injection Inject 48-84 Units into the skin 3 (three) times daily before meals. 70-150= 48 units, 151-200= 54 units, 201-250= 60 units, 251-300 =66 units, 301-350= 72 units, 351-400= 78 units, over 400= 84 units.      Marland Kitchen HYDROcodone-acetaminophen (NORCO) 5-325 MG per tablet Take 2 tablets by mouth every 6 (six) hours as needed for pain.       . hydroxypropyl methylcellulose (ISOPTO TEARS) 2.5 % ophthalmic solution Place 1 drop into both eyes 2 (two) times daily.      . insulin glargine (LANTUS) 100 UNIT/ML injection Inject 65 Units into the skin 2 (two) times daily.       Marland Kitchen ipratropium-albuterol (DUONEB) 0.5-2.5 (3) MG/3ML SOLN Take 3 mLs by nebulization every 6 (six) hours as needed (shortness of breath).      . lansoprazole (PREVACID) 30 MG capsule Take 30 mg by mouth daily.      Marland Kitchen lidocaine (LIDODERM) 5 % Place 1 patch onto the skin daily. Remove & Discard patch within 12 hours or as directed by MD      . lisinopril (PRINIVIL,ZESTRIL) 2.5 MG tablet Take 2.5 mg by mouth daily.       . Melatonin 3 MG TABS Take 3 mg by mouth at bedtime.      . metoprolol succinate (TOPROL-XL) 25 MG 24 hr tablet Take 25 mg by mouth daily.        Bernadette Hoit Sodium (SENNA S PO) Take 1 tablet by mouth 2 (two) times daily.       Marland Kitchen  temazepam (RESTORIL) 15 MG capsule       . tobramycin (TOBREX) 0.3 % ophthalmic solution Place 1 drop into the right eye every 8 (eight) hours.       Marland Kitchen zolpidem (AMBIEN) 5 MG tablet Take 5 mg by mouth at bedtime as needed for sleep.       Marland Kitchen anastrozole (ARIMIDEX) 1 MG tablet Take 1 tablet (1 mg total) by mouth  daily.  30 tablet  12  . [DISCONTINUED] ClonazePAM (KLONOPIN PO) Take 0.2 mg by mouth 2 (two) times daily.        . [DISCONTINUED] escitalopram (LEXAPRO) 20 MG tablet Take 10 mg by mouth. Take 3 tablets in the AM      . [DISCONTINUED] esomeprazole (NEXIUM) 40 MG capsule Take 40 mg by mouth daily before breakfast.        . [DISCONTINUED] Insulin Regular Human (HUMULIN R U-500, CONCENTRATED, [place]) Inject into the skin 3 (three) times daily. Take three times / day prn, per SS Insulin coverage       No current facility-administered medications for this visit.    OBJECTIVE: Filed Vitals:   05/21/12 1532  BP: 107/77  Pulse: 96  Temp: 97.9 F (36.6 C)  Resp: 20     Body mass index is 32.77 kg/(Carr^2).      ECOG FS: 0 - Asymptomatic, nonambulatory  General appearance: Alert, cooperative, well nourished, no apparent distress Head: Normocephalic, without obvious abnormality, atraumatic Eyes: Arcus senilis, PERRLA, EOMI Nose: Nares, septum and mucosa are normal, no drainage or sinus tenderness Neck: No adenopathy, supple, symmetrical, trachea midline, goiter, no tenderness Resp: Clear to auscultation bilaterally Cardio: Regular rate and rhythm, S1, S2 normal, no murmur, click, rub or gallop Breasts: Diaphoretic bilaterally underneath breasts, pendulous bilaterally, left breast has well-healed surgical scars, right breast has healing surgical scars, right breast contracted nipple, right breast architectural changes,  no lymphadenopathy, no nipple inversion, no axilla fullness GI:  Soft, distended, non-tender, hypoactive bowel sounds, unable to fully assess organomegaly from the patient's seated position Skin: Seborrheic keratosis on trunk area Extremities: Lower left extremity has internal foot rotation/foot drop, atraumatic, no cyanosis or edema Lymph nodes: Cervical, supraclavicular, and axillary nodes normal Neurologic: Grossly normal, forgetful at times, wheelchair-bound  LAB RESULTS: Lab  Results  Component Value Date   WBC 5.4 05/21/2012   NEUTROABS 3.5 05/21/2012   HGB 14.9 05/21/2012   HCT 43.0 05/21/2012   MCV 92.5 05/21/2012   PLT 190 05/21/2012      Chemistry      Component Value Date/Time   NA 136 05/21/2012 1432   NA 137 04/21/2012 0822   K 4.3 05/21/2012 1432   K 4.1 04/21/2012 0822   CL 97* 05/21/2012 1432   CL 97 04/21/2012 0822   CO2 29 05/21/2012 1432   CO2 31 04/21/2012 0822   BUN 17.7 05/21/2012 1432   BUN 17 04/21/2012 0822   CREATININE 0.8 05/21/2012 1432   CREATININE 0.52 04/21/2012 0822      Component Value Date/Time   CALCIUM 9.3 05/21/2012 1432   CALCIUM 9.8 04/21/2012 0822   ALKPHOS 225* 05/21/2012 1432   ALKPHOS 152* 04/21/2012 0822   AST 23 05/21/2012 1432   AST 20 04/21/2012 0822   ALT 37 05/21/2012 1432   ALT 20 04/21/2012 0822   BILITOT 0.67 05/21/2012 1432   BILITOT 0.5 04/21/2012 0822       No results found for this basename: LABCA2    Urinalysis    Component Value  Date/Time   COLORURINE YELLOW 03/20/2011 1805   APPEARANCEUR CLEAR 03/20/2011 1805   LABSPEC 1.040* 03/20/2011 1805   PHURINE 5.0 03/20/2011 1805   GLUCOSEU >1000* 03/20/2011 1805   HGBUR NEGATIVE 03/20/2011 1805   HGBUR negative 01/27/2008 1543   BILIRUBINUR NEGATIVE 03/20/2011 1805   KETONESUR NEGATIVE 03/20/2011 1805   PROTEINUR NEGATIVE 03/20/2011 1805   UROBILINOGEN 0.2 03/20/2011 1805   NITRITE NEGATIVE 03/20/2011 1805   LEUKOCYTESUR NEGATIVE 03/20/2011 1805    STUDIES: No results found.  ASSESSMENT: 72 y.o. [place]:  1.  Status post left breast needle localized lumpectomy on 10/20/2008 which showed intraductal papillomas, focally transected at inked margin of resection, fibrocystic changes, usual ductal hyperplasia.  2.  Status post right breast needle core biopsy of the 2 o'clock position on 03/06/2012 which showed intraductal papilloma with usual ductal hyperplasia.  3.  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  Status post right breast excision on 04/21/2012 which showed intraductal papilloma, focal atypical ductal hyperplasia,  microcalcifications identified.   PLAN: The patient has adamantly expressed her opposition to receiving chemotherapy and/or radiation therapy. Dr. Darnelle Carr discussed in detail with the patient and her visitors that her cancer can come back in the same breast and how this could happen.  He described in detail two breast cancer recurrence prevention methods which include radiation and antiestrogen pills.    Dr. Darnelle Carr stated he would like to start her on antiestrogen therapy with Anastrozole.  He discussed the major side effects of this medication including hot flashes, feeling "old," vaginal dryness, night sweats, cholesterol elevation, and weakened bones in detail.  Dr. Darnelle Carr will start Ms. Dewey on antiestrogen therapy with Anastrozole 1 mg by mouth daily, today. She was given a written prescription for Anastrozole #30 with 12 refills.    The patient is currently taking vitamin D3 2000 IUs daily.  Dr. Darnelle Carr will discuss scheduling a bone density examination during her next office visit.  We plan to see Ms. Vanloan again in 3 months to evaluate her tolerance of Anastrozole.  We will check a CBC, CMP, LDH, and vitamin D level at that time.    All questions were answered.  The patient and her guests were encouraged to contact us in the interim with any problems, questions or concerns.   Larina Bras, NP-C 05/22/2012, 8:27 PM

## 2012-05-22 ENCOUNTER — Telehealth: Payer: Self-pay | Admitting: Oncology

## 2012-05-30 ENCOUNTER — Other Ambulatory Visit (HOSPITAL_COMMUNITY): Payer: Self-pay | Admitting: Oncology

## 2012-07-15 ENCOUNTER — Encounter: Payer: Self-pay | Admitting: *Deleted

## 2012-07-15 NOTE — Progress Notes (Signed)
Mailed after appt letter to pt. 

## 2012-08-06 ENCOUNTER — Telehealth: Payer: Self-pay | Admitting: *Deleted

## 2012-08-06 NOTE — Telephone Encounter (Signed)
Called and spoke with patient and confirmed appt for 08/22/12 at 115pm.

## 2012-08-20 ENCOUNTER — Ambulatory Visit: Payer: Self-pay | Admitting: Oncology

## 2012-08-20 ENCOUNTER — Other Ambulatory Visit: Payer: Self-pay | Admitting: Lab

## 2012-08-22 ENCOUNTER — Encounter: Payer: Self-pay | Admitting: Physician Assistant

## 2012-08-22 ENCOUNTER — Other Ambulatory Visit (HOSPITAL_BASED_OUTPATIENT_CLINIC_OR_DEPARTMENT_OTHER): Payer: PRIVATE HEALTH INSURANCE | Admitting: Lab

## 2012-08-22 ENCOUNTER — Telehealth: Payer: Self-pay | Admitting: Oncology

## 2012-08-22 ENCOUNTER — Ambulatory Visit (HOSPITAL_BASED_OUTPATIENT_CLINIC_OR_DEPARTMENT_OTHER): Payer: PRIVATE HEALTH INSURANCE | Admitting: Physician Assistant

## 2012-08-22 VITALS — BP 130/80 | HR 71 | Temp 98.3°F | Resp 20 | Ht 64.0 in

## 2012-08-22 DIAGNOSIS — C50219 Malignant neoplasm of upper-inner quadrant of unspecified female breast: Secondary | ICD-10-CM

## 2012-08-22 DIAGNOSIS — C50911 Malignant neoplasm of unspecified site of right female breast: Secondary | ICD-10-CM

## 2012-08-22 DIAGNOSIS — M810 Age-related osteoporosis without current pathological fracture: Secondary | ICD-10-CM

## 2012-08-22 DIAGNOSIS — Z780 Asymptomatic menopausal state: Secondary | ICD-10-CM

## 2012-08-22 DIAGNOSIS — C50211 Malignant neoplasm of upper-inner quadrant of right female breast: Secondary | ICD-10-CM

## 2012-08-22 LAB — CBC WITH DIFFERENTIAL/PLATELET
BASO%: 0.7 % (ref 0.0–2.0)
EOS%: 3.3 % (ref 0.0–7.0)
LYMPH%: 34.3 % (ref 14.0–49.7)
MCH: 32 pg (ref 25.1–34.0)
MCHC: 34.7 g/dL (ref 31.5–36.0)
MONO#: 0.3 10*3/uL (ref 0.1–0.9)
RBC: 4.38 10*6/uL (ref 3.70–5.45)
WBC: 5.4 10*3/uL (ref 3.9–10.3)
lymph#: 1.9 10*3/uL (ref 0.9–3.3)

## 2012-08-22 LAB — COMPREHENSIVE METABOLIC PANEL (CC13)
AST: 14 U/L (ref 5–34)
BUN: 20.4 mg/dL (ref 7.0–26.0)
Calcium: 9.3 mg/dL (ref 8.4–10.4)
Chloride: 100 mEq/L (ref 98–109)
Creatinine: 0.8 mg/dL (ref 0.6–1.1)
Glucose: 383 mg/dl — ABNORMAL HIGH (ref 70–140)

## 2012-08-22 NOTE — Telephone Encounter (Signed)
gv pt appt schedule for February 2015 and bone density test for 9/15. Pt on recall @ [facility] for November f/u w/Dr. Derrell Lolling - pt aware. S/w Tina @ [facility].

## 2012-08-22 NOTE — Progress Notes (Signed)
Katherine Carr  [phone] [phone]  MEDICAL ONCOLOGY NEW PATIENT EVALUATION    ID: Katherine Carr   DOB: Sep 07, 1940  MR#: [id number]  JYN#:[id number]   PCP: Katherine Housekeeper, MD SU: Katherine Carr, M.D. OTHER MD: Dr. Sharl Carr, Dr. Lolly Carr, Dr. Luciana Carr, Dr. Danielle Carr, Dr. Pennie Carr, Dr. Evette Carr    HISTORY OF PRESENT ILLNESS: The patient states that she felt a "knot" in her right breast in 02/2012 and requested to have a mammogram.  The patient had a unilateral right digital diagnostic mammogram on 03/06/2012 which showed a subtle superficial focal density over the region of the patient's palpable abnormality.  The remainder of the exam was unchanged.  The patient underwent a right breast ultrasound on the same day which showed a 4 x 8 x 8 mm intraductal mass within the superficial duct at the 2:00 position of the right periareolar region  2 cm from the nipple corresponding to patient's palpable abnormality.  The patient had a right needle core breast biopsy at the 2:00 position, 2 cm from the nipple on 03/06/2012 which showed intraductal papilloma with usual ductal hyperplasia.  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  Ductal carcinoma in situ measures 0.8 cm in greatest dimension.  The tumor involves cauterized posterior margin age.  Other margins are negative.  ER 98% positive, stage 0, pTis, pNX, MX.  Left abdominal wall nevus shows seborrheic keratosis no dysplasia or malignancy identified.  The patient has a history of left breast needle localized lumpectomy on 10/10/2008 which showed intraductal papillomas, focally transected at 8 margin of resection.  Fibrocystic changes.  Usual ductal hyperplasia.   INTERVAL HISTORY: Katherine Carr returns today accompanied by Katherine Carr, a CNA from [facility], for followup of  Katherine Carr is recent right breast carcinoma. Since her appointment here in may, Katherine Carr was started on anastrozole which she appears to be tolerating well. Interval history is otherwise unremarkable.   REVIEW OF SYSTEMS: Katherine Carr denies any recent illnesses and has had no fevers or chills. She does have hot flashes, but these do not seem to have worsened since beginning the anastrozole. She feels "bloated", but denies any nausea, and is having bowel movements at least every other day. She's had no signs of abnormal bleeding. She has shortness of breath which is stable. She denies any increased cough. She's had no abnormal headaches or dizziness. She has chronic pain, especially in the joints, but this has not worsened since starting the aromatase inhibitor.  A detailed review of systems is otherwise stable and noncontributory.   PAST MEDICAL HISTORY: Past Medical History  Diagnosis Date  . Depression   . Diabetes mellitus   . Hypertension   . Peripheral neuropathy   . Diverticulosis   . Urinary incontinence   . Herpes   . Ovarian cyst   . H/O myomectomy   . Fibrous breast lumps   . Wears glasses   . Fibromyalgia   . GERD (gastroesophageal reflux disease)   . COPD (chronic obstructive pulmonary disease)   . Asthma     bronchial  . Fatty liver     hx: of  . Anxiety   . Left carotid stenosis     Hx: of    PAST SURGICAL HISTORY: Past Surgical History  Procedure Laterality Date  . Back surgery      lower  . Hand surgery  bilateral carpal tunnel surgery  . Cesarean section    . Abdominal hysterectomy    . Myomectomy    . Breast lumpectomy with needle localization Right 04/02/2012    Procedure: Excision Ductal System Right Breast with Needle Localization;  Surgeon: Katherine Mention, MD;  Location: [facility];  Service: General;  Laterality: Right;  needle localization at 7:30 at [facility]   . Nevus excision Left 04/02/2012    Procedure: Excision Nevus Left Abdominal Wall;   Surgeon: Katherine Mention, MD;  [facility];  Service: General;  Laterality: Left;  . Breast lumpectomy Right 04/21/2012    Procedure: Right breast LUMPECTOMY with re-excision of margins;  Surgeon: Katherine Mention, MD;  Location: [facility];  Service: General;  Laterality: Right;    FAMILY HISTORY Family History  Problem Relation Age of Onset  . Heart disease Mother   . Heart disease Brother   . Diabetes Brother   . Arthritis Sister   . Stroke Sister   . Cirrhosis Brother   . Cancer Brother     Bladder cancer  She has a sister Katherine Carr who has a history of having arthritis and a stroke.  She has a brother named Katherine Carr who had heart disease and diabetes.  She has another brother Katherine Carr who had cirrhosis and bladder cancer  GYNECOLOGIC HISTORY: Gravida 1, para 1, age of menarche 38, first live birth at the age of 30, last menstrual period in 1974, the patient has history of estradiol hormone replacement from 2001 until this year (2014).  SOCIAL HISTORY: Katherine Carr was born in [place]) [place], [place] and got married to her first husband when she was 46 years old.  She had her first and only child with her first husband.  Her son Katherine Carr, is a Biochemist, clinical in the [employer] and stationed in [place].  She was married for a second time when she was 72 years old and married for the third and last time when she was 72 years old.  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  She has 2 teenage granddaughters Katherine Carr that is 28 years old and Katherine Carr that is 71 years old.  They reside in [place] with her father.  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     ADVANCED DIRECTIVES: The patient states that she wishes to be a DO NOT RESUSCITATE and the official document is on file with [facility].  Her medical power of attorney is Katherine Carr who may be reached at [phone].  HEALTH MAINTENANCE: History  Substance Use Topics  . Smoking status: Former Smoker -- 20 years    Types: Cigarettes    Quit date: 08/15/1996  . Smokeless tobacco: Never Used     Comment: smoked 2-3 cigarettes/day  . Alcohol Use: No    Colonoscopy: 2012 PAP: 1971 Bone density: 2012 Lipid panel: Not on file  Allergies  Allergen Reactions  . Sertraline Hcl Hives  . Codeine Hives and Swelling  . Cyclobenzaprine Hcl Hives and Swelling  . Fentanyl     REACTION: PANIC ATTACKS  . Furosemide     REACTION: face was swollen and hand  . Gadolinium Derivatives Swelling  . Hydrocodone-Acetaminophen   . Iohexol      Code: SOB, Desc: xray dye, Onset  Date: [id number]   . Levofloxacin     REACTION: PANIC ATTACK,DIARRHEA,EYES DISCOLORD  . Lorazepam Nausea And Vomiting  . Lubiprostone     REACTION: REALLY BAD NAUSEA  . Metformin Hives and Swelling  . Metoclopramide Nausea And Vomiting and Swelling  . Morphine     REACTION: itching, panic attacks  . Oxycodone Hives and Swelling  . Promethazine Hcl     REACTION: TRAMA ANDJITTERS IN MY BODY  . Rosiglitazone Hives and Swelling  . Sertraline Hcl Hives and Swelling  . Verapamil Hives and Nausea And Vomiting    Current Outpatient Prescriptions  Medication Sig Dispense Refill  . ALPRAZolam (XANAX) 0.5 MG tablet Take 0.5 mg by mouth 2 (two) times daily.       Marland Kitchen ALPRAZolam (XANAX) 0.5 MG tablet Take 0.5 mg by mouth daily as needed for anxiety.      Marland Kitchen alum & mag hydroxide-simeth (MAALOX/MYLANTA) 200-200-20 MG/5ML suspension Take 30 mLs by mouth every 4 (four) hours as needed for indigestion.      Marland Kitchen anastrozole (ARIMIDEX) 1 MG tablet Take 1 tablet (1 mg total) by mouth daily.  30 tablet  12  . ARIPiprazole (ABILIFY) 5 MG tablet Take 1 tablet (5 mg total) by mouth at bedtime.  30 tablet  2  . atorvastatin (LIPITOR) 10 MG tablet Take 10 mg by mouth at  bedtime.      . bisacodyl (DULCOLAX) 10 MG suppository Place 10 mg rectally daily as needed for constipation.       . Cholecalciferol (VITAMIN D3) 1000 UNITS CAPS Take 2,000 Units by mouth daily.      . diclofenac sodium (VOLTAREN) 1 % GEL Apply 4 g topically 3 (three) times daily as needed (joint pain).      . DULoxetine (CYMBALTA) 60 MG capsule Take 60 mg by mouth 2 (two) times daily.      . fluticasone (FLONASE) 50 MCG/ACT nasal spray Place 2 sprays into the nose daily as needed for rhinitis or allergies.      Marland Kitchen gabapentin (NEURONTIN) 600 MG tablet Take 600 mg by mouth 3 (three) times daily.        Marland Kitchen HUMALOG 100 UNIT/ML injection Inject 48-84 Units into the skin 3 (three) times daily before meals. 70-150= 48 units, 151-200= 54 units, 201-250= 60 units, 251-300 =66 units, 301-350= 72 units, 351-400= 78 units, over 400= 84 units.      Marland Kitchen HYDROcodone-acetaminophen (NORCO) 5-325 MG per tablet Take 2 tablets by mouth every 6 (six) hours as needed for pain.       . hydroxypropyl methylcellulose (ISOPTO TEARS) 2.5 % ophthalmic solution Place 1 drop into both eyes 2 (two) times daily.      . insulin glargine (LANTUS) 100 UNIT/ML injection Inject 65 Units into the skin 2 (two) times daily.       Marland Kitchen ipratropium-albuterol (DUONEB) 0.5-2.5 (3) MG/3ML SOLN Take 3 mLs by nebulization every 6 (six) hours as needed (shortness of breath).      . lansoprazole (PREVACID) 30 MG capsule Take 30 mg by mouth daily.      Marland Kitchen lidocaine (LIDODERM) 5 % Place 1 patch onto the skin daily. Remove & Discard patch within 12 hours or as directed by MD      . lisinopril (PRINIVIL,ZESTRIL) 2.5 MG tablet Take 2.5 mg by mouth daily.       . Melatonin 3 MG TABS Take 3 mg by mouth at bedtime.      . metoprolol succinate (TOPROL-XL) 25 MG  24 hr tablet Take 25 mg by mouth daily.        Bernadette Hoit Sodium (SENNA S PO) Take 1 tablet by mouth 2 (two) times daily.       . temazepam (RESTORIL) 15 MG capsule       . tobramycin  (TOBREX) 0.3 % ophthalmic solution Place 1 drop into the right eye every 8 (eight) hours.       Marland Kitchen zolpidem (AMBIEN) 5 MG tablet Take 5 mg by mouth at bedtime as needed for sleep.       . [DISCONTINUED] ClonazePAM (KLONOPIN PO) Take 0.2 mg by mouth 2 (two) times daily.        . [DISCONTINUED] escitalopram (LEXAPRO) 20 MG tablet Take 10 mg by mouth. Take 3 tablets in the AM      . [DISCONTINUED] esomeprazole (NEXIUM) 40 MG capsule Take 40 mg by mouth daily before breakfast.        . [DISCONTINUED] Insulin Regular Human (HUMULIN R U-500, CONCENTRATED, [place]) Inject into the skin 3 (three) times daily. Take three times / day prn, per SS Insulin coverage       No current facility-administered medications for this visit.    OBJECTIVE:  Elderly white female who is in no acute distress, presents in a wheelchair where she is examined Filed Vitals:   08/22/12 1336  BP: 130/80  Pulse: 71  Temp: 98.3 F (36.8 C)  Resp: 20     Body mass index is 0.00 kg/(m^2).   (Pt unable to stand for weight)  ECOG: 2  Physical Exam: HEENT:  Sclerae anicteric.  Oropharynx clear.  Nodes:  No cervical or supraclavicular lymphadenopathy palpated.  Breast Exam:  Right breast is status post lumpectomy, with no specialist nodularities or evidence of local recurrence. Left breast is status post remote lumpectomy, and is otherwise unremarkable. Axillae are benign bilaterally, no palpable adenopathy Lungs:  Clear to auscultation bilaterally. No crackles or wheezes  Heart:  Regular rate and rhythm.   Abdomen:  Soft, nontender.  Positive bowel sounds.  Extremities:  No peripheral edema  Neuro:  Nonfocal. Well oriented with appropriate affect     LAB RESULTS: Lab Results  Component Value Date   WBC 5.4 08/22/2012   NEUTROABS 3.0 08/22/2012   HGB 14.0 08/22/2012   HCT 40.3 08/22/2012   MCV 92.2 08/22/2012   PLT 181 08/22/2012      Chemistry      Component Value Date/Time   NA 137 08/22/2012 1323   NA 137 04/21/2012 0822    K 4.9 08/22/2012 1323   K 4.1 04/21/2012 0822   CL 97* 05/21/2012 1432   CL 97 04/21/2012 0822   CO2 27 08/22/2012 1323   CO2 31 04/21/2012 0822   BUN 20.4 08/22/2012 1323   BUN 17 04/21/2012 0822   CREATININE 0.8 08/22/2012 1323   CREATININE 0.52 04/21/2012 0822      Component Value Date/Time   CALCIUM 9.3 08/22/2012 1323   CALCIUM 9.8 04/21/2012 0822   ALKPHOS 218* 08/22/2012 1323   ALKPHOS 152* 04/21/2012 0822   AST 14 08/22/2012 1323   AST 20 04/21/2012 0822   ALT 34 08/22/2012 1323   ALT 20 04/21/2012 0822   BILITOT 0.51 08/22/2012 1323   BILITOT 0.5 04/21/2012 0822      STUDIES:  No results found.    ASSESSMENT: 72 y[place]:  1.  Status post left breast needle localized lumpectomy on 10/20/2008 which showed intraductal papillomas, focally  transected at inked margin of resection, fibrocystic changes, usual ductal hyperplasia.  2.  Status post right breast needle core biopsy of the 2 o'clock position on 03/06/2012 which showed intraductal papilloma with usual ductal hyperplasia.  3.  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  Status post right breast excision on 04/21/2012 which showed intraductal papilloma, focal atypical ductal hyperplasia,  microcalcifications identified.  5.  patient declined adjuvant chemotherapy or radiation.  6.  Began on anastrozole, 1 mg daily, in may 2014, the goal being to continue for total of 5 years   PLAN: Tessie is still comfortable with her current treatment plan, and will continue on anastrozole as before. With regards to her breast cancer, she seems to be doing well, and clinically has no evidence of recurrence at this time. We did discuss the increased risk of osteoporosis while on anastrozole, and of course you are he has a history  of osteoporosis prior to beginning the medication. She does need a repeat bone density study, and that'll be ordered for September.  We will see Elverna again in 6 months, and we'll alternate appointments with Dr. Derrell Lolling. Accordingly, she'll see Dr. Derrell Lolling in November, and return to see Dr. Darnelle Catalan in February. Carmaleta is given this plan in writing today. She voices understanding and agreement with our plan, and knows to call with any changes or problems prior to her next appointment.   Zollie Scale, PA-C  08/22/2012, 3:29 PM

## 2012-08-23 LAB — VITAMIN D 25 HYDROXY (VIT D DEFICIENCY, FRACTURES): Vit D, 25-Hydroxy: 48 ng/mL (ref 30–89)

## 2012-09-15 ENCOUNTER — Ambulatory Visit
Admission: RE | Admit: 2012-09-15 | Discharge: 2012-09-15 | Disposition: A | Discharge status: Home / Self Care | Payer: PRIVATE HEALTH INSURANCE | Source: Ambulatory Visit | Attending: Physician Assistant | Admitting: Physician Assistant

## 2012-09-15 DIAGNOSIS — M810 Age-related osteoporosis without current pathological fracture: Secondary | ICD-10-CM

## 2012-09-15 DIAGNOSIS — C50211 Malignant neoplasm of upper-inner quadrant of right female breast: Secondary | ICD-10-CM

## 2012-09-15 DIAGNOSIS — Z780 Asymptomatic menopausal state: Secondary | ICD-10-CM

## 2012-10-28 ENCOUNTER — Emergency Department (HOSPITAL_COMMUNITY): Payer: PRIVATE HEALTH INSURANCE

## 2012-10-28 ENCOUNTER — Encounter (HOSPITAL_COMMUNITY): Payer: Self-pay | Admitting: Emergency Medicine

## 2012-10-28 ENCOUNTER — Inpatient Hospital Stay (HOSPITAL_COMMUNITY)
Admission: EM | Admit: 2012-10-28 | Discharge: 2012-10-30 | DRG: 689 | Disposition: A | Discharge status: Skilled Nursing Facility | Payer: PRIVATE HEALTH INSURANCE | Attending: Internal Medicine | Admitting: Internal Medicine

## 2012-10-28 DIAGNOSIS — Q4479 Other congenital malformations of liver: Secondary | ICD-10-CM

## 2012-10-28 DIAGNOSIS — R1319 Other dysphagia: Secondary | ICD-10-CM

## 2012-10-28 DIAGNOSIS — M549 Dorsalgia, unspecified: Secondary | ICD-10-CM

## 2012-10-28 DIAGNOSIS — Z9181 History of falling: Secondary | ICD-10-CM

## 2012-10-28 DIAGNOSIS — E669 Obesity, unspecified: Secondary | ICD-10-CM

## 2012-10-28 DIAGNOSIS — Z8679 Personal history of other diseases of the circulatory system: Secondary | ICD-10-CM

## 2012-10-28 DIAGNOSIS — K219 Gastro-esophageal reflux disease without esophagitis: Secondary | ICD-10-CM | POA: Diagnosis present

## 2012-10-28 DIAGNOSIS — R739 Hyperglycemia, unspecified: Secondary | ICD-10-CM

## 2012-10-28 DIAGNOSIS — R410 Disorientation, unspecified: Secondary | ICD-10-CM

## 2012-10-28 DIAGNOSIS — IMO0002 Reserved for concepts with insufficient information to code with codable children: Secondary | ICD-10-CM

## 2012-10-28 DIAGNOSIS — G609 Hereditary and idiopathic neuropathy, unspecified: Secondary | ICD-10-CM | POA: Diagnosis present

## 2012-10-28 DIAGNOSIS — I6529 Occlusion and stenosis of unspecified carotid artery: Secondary | ICD-10-CM | POA: Diagnosis present

## 2012-10-28 DIAGNOSIS — K838 Other specified diseases of biliary tract: Secondary | ICD-10-CM

## 2012-10-28 DIAGNOSIS — J4489 Other specified chronic obstructive pulmonary disease: Secondary | ICD-10-CM | POA: Diagnosis present

## 2012-10-28 DIAGNOSIS — Z87891 Personal history of nicotine dependence: Secondary | ICD-10-CM

## 2012-10-28 DIAGNOSIS — M47812 Spondylosis without myelopathy or radiculopathy, cervical region: Secondary | ICD-10-CM

## 2012-10-28 DIAGNOSIS — E782 Mixed hyperlipidemia: Secondary | ICD-10-CM | POA: Diagnosis present

## 2012-10-28 DIAGNOSIS — L0231 Cutaneous abscess of buttock: Secondary | ICD-10-CM | POA: Diagnosis present

## 2012-10-28 DIAGNOSIS — R945 Abnormal results of liver function studies: Secondary | ICD-10-CM

## 2012-10-28 DIAGNOSIS — R1013 Epigastric pain: Secondary | ICD-10-CM

## 2012-10-28 DIAGNOSIS — Z981 Arthrodesis status: Secondary | ICD-10-CM

## 2012-10-28 DIAGNOSIS — K648 Other hemorrhoids: Secondary | ICD-10-CM

## 2012-10-28 DIAGNOSIS — G43009 Migraine without aura, not intractable, without status migrainosus: Secondary | ICD-10-CM

## 2012-10-28 DIAGNOSIS — F0280 Dementia in other diseases classified elsewhere without behavioral disturbance: Secondary | ICD-10-CM | POA: Diagnosis present

## 2012-10-28 DIAGNOSIS — G43909 Migraine, unspecified, not intractable, without status migrainosus: Secondary | ICD-10-CM

## 2012-10-28 DIAGNOSIS — R079 Chest pain, unspecified: Secondary | ICD-10-CM

## 2012-10-28 DIAGNOSIS — I739 Peripheral vascular disease, unspecified: Secondary | ICD-10-CM

## 2012-10-28 DIAGNOSIS — K5289 Other specified noninfective gastroenteritis and colitis: Secondary | ICD-10-CM

## 2012-10-28 DIAGNOSIS — J069 Acute upper respiratory infection, unspecified: Secondary | ICD-10-CM

## 2012-10-28 DIAGNOSIS — Z794 Long term (current) use of insulin: Secondary | ICD-10-CM

## 2012-10-28 DIAGNOSIS — Q441 Other congenital malformations of gallbladder: Secondary | ICD-10-CM

## 2012-10-28 DIAGNOSIS — G9340 Encephalopathy, unspecified: Secondary | ICD-10-CM | POA: Diagnosis present

## 2012-10-28 DIAGNOSIS — J309 Allergic rhinitis, unspecified: Secondary | ICD-10-CM

## 2012-10-28 DIAGNOSIS — K3184 Gastroparesis: Secondary | ICD-10-CM

## 2012-10-28 DIAGNOSIS — R269 Unspecified abnormalities of gait and mobility: Secondary | ICD-10-CM

## 2012-10-28 DIAGNOSIS — M1990 Unspecified osteoarthritis, unspecified site: Secondary | ICD-10-CM | POA: Diagnosis present

## 2012-10-28 DIAGNOSIS — Z853 Personal history of malignant neoplasm of breast: Secondary | ICD-10-CM

## 2012-10-28 DIAGNOSIS — F329 Major depressive disorder, single episode, unspecified: Secondary | ICD-10-CM

## 2012-10-28 DIAGNOSIS — R109 Unspecified abdominal pain: Secondary | ICD-10-CM

## 2012-10-28 DIAGNOSIS — E11649 Type 2 diabetes mellitus with hypoglycemia without coma: Secondary | ICD-10-CM | POA: Diagnosis present

## 2012-10-28 DIAGNOSIS — E785 Hyperlipidemia, unspecified: Secondary | ICD-10-CM | POA: Diagnosis present

## 2012-10-28 DIAGNOSIS — Z862 Personal history of diseases of the blood and blood-forming organs and certain disorders involving the immune mechanism: Secondary | ICD-10-CM

## 2012-10-28 DIAGNOSIS — F411 Generalized anxiety disorder: Secondary | ICD-10-CM | POA: Diagnosis present

## 2012-10-28 DIAGNOSIS — K5730 Diverticulosis of large intestine without perforation or abscess without bleeding: Secondary | ICD-10-CM

## 2012-10-28 DIAGNOSIS — R4182 Altered mental status, unspecified: Secondary | ICD-10-CM

## 2012-10-28 DIAGNOSIS — K449 Diaphragmatic hernia without obstruction or gangrene: Secondary | ICD-10-CM

## 2012-10-28 DIAGNOSIS — R5381 Other malaise: Secondary | ICD-10-CM

## 2012-10-28 DIAGNOSIS — J209 Acute bronchitis, unspecified: Secondary | ICD-10-CM

## 2012-10-28 DIAGNOSIS — IMO0001 Reserved for inherently not codable concepts without codable children: Secondary | ICD-10-CM

## 2012-10-28 DIAGNOSIS — R7989 Other specified abnormal findings of blood chemistry: Secondary | ICD-10-CM | POA: Diagnosis present

## 2012-10-28 DIAGNOSIS — F341 Dysthymic disorder: Secondary | ICD-10-CM

## 2012-10-28 DIAGNOSIS — Z993 Dependence on wheelchair: Secondary | ICD-10-CM

## 2012-10-28 DIAGNOSIS — R1084 Generalized abdominal pain: Secondary | ICD-10-CM

## 2012-10-28 DIAGNOSIS — I10 Essential (primary) hypertension: Secondary | ICD-10-CM | POA: Diagnosis present

## 2012-10-28 DIAGNOSIS — R609 Edema, unspecified: Secondary | ICD-10-CM

## 2012-10-28 DIAGNOSIS — Z79899 Other long term (current) drug therapy: Secondary | ICD-10-CM

## 2012-10-28 DIAGNOSIS — J45909 Unspecified asthma, uncomplicated: Secondary | ICD-10-CM

## 2012-10-28 DIAGNOSIS — K589 Irritable bowel syndrome without diarrhea: Secondary | ICD-10-CM

## 2012-10-28 DIAGNOSIS — J449 Chronic obstructive pulmonary disease, unspecified: Secondary | ICD-10-CM | POA: Diagnosis present

## 2012-10-28 DIAGNOSIS — E119 Type 2 diabetes mellitus without complications: Secondary | ICD-10-CM | POA: Diagnosis present

## 2012-10-28 DIAGNOSIS — K2940 Chronic atrophic gastritis without bleeding: Secondary | ICD-10-CM

## 2012-10-28 DIAGNOSIS — N390 Urinary tract infection, site not specified: Principal | ICD-10-CM | POA: Diagnosis present

## 2012-10-28 DIAGNOSIS — F3289 Other specified depressive episodes: Secondary | ICD-10-CM | POA: Diagnosis present

## 2012-10-28 HISTORY — DX: Unspecified osteoarthritis, unspecified site: M19.90

## 2012-10-28 HISTORY — DX: Personal history of other diseases of the digestive system: Z87.19

## 2012-10-28 HISTORY — DX: Migraine, unspecified, not intractable, without status migrainosus: G43.909

## 2012-10-28 HISTORY — DX: Malignant neoplasm of unspecified site of unspecified female breast: C50.919

## 2012-10-28 LAB — TROPONIN I: Troponin I: <0.3 ng/mL (ref <0.30)

## 2012-10-28 LAB — GLUCOSE, CAPILLARY: Glucose-Capillary: 272 mg/dL — ABNORMAL HIGH (ref 70–99)

## 2012-10-28 LAB — COMPREHENSIVE METABOLIC PANEL
ALT: 185 U/L — ABNORMAL HIGH (ref 0–35)
Albumin: 3.2 g/dL — ABNORMAL LOW (ref 3.5–5.2)
BUN: 17 mg/dL (ref 6–23)
Calcium: 9.5 mg/dL (ref 8.4–10.5)
GFR calc Af Amer: >90 mL/min (ref >90)
Glucose, Bld: 281 mg/dL — ABNORMAL HIGH (ref 70–99)
Potassium: 4.3 mEq/L (ref 3.5–5.1)
Sodium: 136 mEq/L (ref 135–145)
Total Protein: 7.2 g/dL (ref 6.0–8.3)

## 2012-10-28 LAB — CBC
HCT: 40.2 % (ref 36.0–46.0)
MCH: 31.9 pg (ref 26.0–34.0)
MCHC: 36.1 g/dL — ABNORMAL HIGH (ref 30.0–36.0)
MCV: 88.5 fL (ref 78.0–100.0)
Platelets: 179 10*3/uL (ref 150–400)
RBC: 4.54 MIL/uL (ref 3.87–5.11)
RDW: 14.4 % (ref 11.5–15.5)

## 2012-10-28 LAB — POCT I-STAT, CHEM 8
BUN: 17 mg/dL (ref 6–23)
Calcium, Ion: 1.2 mmol/L (ref 1.13–1.30)
Chloride: 98 mEq/L (ref 96–112)
Glucose, Bld: 266 mg/dL — ABNORMAL HIGH (ref 70–99)
HCT: 44 % (ref 36.0–46.0)
Hemoglobin: 15 g/dL (ref 12.0–15.0)
Sodium: 138 mEq/L (ref 135–145)
TCO2: 27 mmol/L (ref 0–100)

## 2012-10-28 LAB — DIFFERENTIAL
Basophils Relative: 0 % (ref 0–1)
Eosinophils Absolute: 0.1 10*3/uL (ref 0.0–0.7)
Eosinophils Relative: 2 % (ref 0–5)
Lymphs Abs: 1.1 10*3/uL (ref 0.7–4.0)
Monocytes Absolute: 0.6 10*3/uL (ref 0.1–1.0)
Neutrophils Relative %: 77 % (ref 43–77)

## 2012-10-28 LAB — PROTIME-INR: Prothrombin Time: 12.2 seconds (ref 11.6–15.2)

## 2012-10-28 LAB — ETHANOL: Alcohol, Ethyl (B): <11 mg/dL (ref 0–11)

## 2012-10-28 MED ORDER — DEXTROSE 5 % IV SOLN
1.0000 g | INTRAVENOUS | Status: DC
  Administered 2012-10-29: 1 g via INTRAVENOUS
  Filled 2012-10-28: qty 10

## 2012-10-28 MED ORDER — SODIUM CHLORIDE 0.9 % IV BOLUS (SEPSIS)
500.0000 mL | Freq: Once | INTRAVENOUS | Status: AC
  Administered 2012-10-28: 500 mL via INTRAVENOUS

## 2012-10-28 MED ORDER — SODIUM CHLORIDE 0.9 % IV SOLN
INTRAVENOUS | Status: AC
  Administered 2012-10-29: 1000 mL via INTRAVENOUS

## 2012-10-28 MED ORDER — ASPIRIN EC 81 MG PO TBEC
81.0000 mg | DELAYED_RELEASE_TABLET | Freq: Every day | ORAL | Status: DC
  Administered 2012-10-29: 81 mg via ORAL
  Filled 2012-10-28 (×2): qty 1

## 2012-10-28 NOTE — ED Provider Notes (Signed)
CSN: [id number]     Arrival date & time 10/28/12  1938 History   First MD Initiated Contact with Patient 10/28/12 1951     Chief Complaint  Patient presents with  . Code Stroke   (Consider location/radiation/quality/duration/timing/severity/associated sxs/prior Treatment) HPI Comments: 72 yo female from nursing home after code stroke called.  Last seen normal 2.5 hrs PTA.  Pt was found leaning to one side and more confused than normal.  NO known head injuries.  Sxs constant, nothing improves.   The history is provided by the patient and the EMS personnel.    Past Medical History  Diagnosis Date  . Depression   . Diabetes mellitus   . Hypertension   . Peripheral neuropathy   . Diverticulosis   . Urinary incontinence   . Herpes   . Ovarian cyst   . H/O myomectomy   . Fibrous breast lumps   . Wears glasses   . Fibromyalgia   . GERD (gastroesophageal reflux disease)   . COPD (chronic obstructive pulmonary disease)   . Asthma     bronchial  . Fatty liver     hx: of  . Anxiety   . Left carotid stenosis     Hx: of   Past Surgical History  Procedure Laterality Date  . Back surgery      lower  . Hand surgery      bilateral carpal tunnel surgery  . Cesarean section    . Abdominal hysterectomy    . Myomectomy    . Breast lumpectomy with needle localization Right 04/02/2012    Procedure: Excision Ductal System Right Breast with Needle Localization;  Surgeon: [name], MD;  Location: [facility];  Service: General;  Laterality: Right;  needle localization at 7:30 at [facility]   . Nevus excision Left 04/02/2012    Procedure: Excision Nevus Left Abdominal Wall;  Surgeon: [name], MD;  Location: [facility];  Service: General;  Laterality: Left;  . Breast lumpectomy Right 04/21/2012    Procedure: Right breast LUMPECTOMY with re-excision of margins;  Surgeon: [name], MD;  Location: [facility];  Service: General;  Laterality: Right;   Family  History  Problem Relation Age of Onset  . Heart disease Mother   . Heart disease Brother   . Diabetes Brother   . Arthritis Sister   . Stroke Sister   . Cirrhosis Brother   . Cancer Brother     Bladder cancer   History  Substance Use Topics  . Smoking status: Former Smoker -- 20 years    Types: Cigarettes    Quit date: 08/15/1996  . Smokeless tobacco: Never Used     Comment: smoked 2-3 cigarettes/day  . Alcohol Use: No   OB History   Grav Para Term Preterm Abortions TAB SAB Ect Mult Living   1 1        1      Review of Systems  Constitutional: Negative for fever and chills.  HENT: Negative for congestion.   Eyes: Negative for visual disturbance.  Respiratory: Negative for shortness of breath.   Cardiovascular: Negative for chest pain.  Gastrointestinal: Negative for vomiting and abdominal pain.  Genitourinary: Negative for dysuria and flank pain.  Musculoskeletal: Negative for back pain, neck pain and neck stiffness.  Skin: Negative for rash.  Neurological: Positive for weakness and headaches. Negative for light-headedness.  Psychiatric/Behavioral: Positive for confusion.    Allergies  Sertraline hcl; Codeine; Cyclobenzaprine hcl; Fentanyl; Furosemide;  Gadolinium derivatives; Hydrocodone-acetaminophen; Iohexol; Levofloxacin; Lorazepam; Lubiprostone; Metformin; Metoclopramide; Morphine; Oxycodone; Promethazine hcl; Rosiglitazone; Sertraline hcl; and Verapamil  Home Medications   Current Outpatient Rx  Name  Route  Sig  Dispense  Refill  . ALPRAZolam (XANAX) 0.5 MG tablet   Oral   Take 0.5 mg by mouth 3 (three) times daily as needed for anxiety.          Marland Kitchen alum & mag hydroxide-simeth (MAALOX/MYLANTA) 200-200-20 MG/5ML suspension   Oral   Take 30 mLs by mouth every 4 (four) hours as needed for indigestion.         Marland Kitchen anastrozole (ARIMIDEX) 1 MG tablet   Oral   Take 1 mg by mouth daily.         . ARIPiprazole (ABILIFY) 2 MG tablet   Oral   Take 2 mg by  mouth daily. *takes with 5mg  tablet for 7mg  dose         . ARIPiprazole (ABILIFY) 5 MG tablet   Oral   Take 5 mg by mouth at bedtime. *takes with 2mg  tablet for 7mg  dose         . atorvastatin (LIPITOR) 10 MG tablet   Oral   Take 10 mg by mouth at bedtime.         . bisacodyl (DULCOLAX) 10 MG suppository   Rectal   Place 10 mg rectally daily as needed for constipation.          . cetirizine (ZYRTEC) 5 MG tablet   Oral   Take 5 mg by mouth daily as needed for allergies.          . Cholecalciferol (VITAMIN D3) 1000 UNITS CAPS   Oral   Take 2,000 Units by mouth daily.         . diclofenac sodium (VOLTAREN) 1 % GEL   Topical   Apply 4 g topically 3 (three) times daily as needed (joint pain).         . DULoxetine (CYMBALTA) 60 MG capsule   Oral   Take 60 mg by mouth 2 (two) times daily.         . fluticasone (FLONASE) 50 MCG/ACT nasal spray   Nasal   Place 2 sprays into the nose daily as needed for rhinitis or allergies.         Marland Kitchen gabapentin (NEURONTIN) 600 MG tablet   Oral   Take 600 mg by mouth 3 (three) times daily.           Marland Kitchen HUMALOG 100 UNIT/ML injection   Subcutaneous   Inject 48-84 Units into the skin 3 (three) times daily before meals. 70-150= 48 units, 151-200= 54 units, 201-250= 60 units, 251-300 =66 units, 301-350= 72 units, 351-400= 78 units, over 400= 84 units.         Marland Kitchen HYDROcodone-acetaminophen (NORCO) 5-325 MG per tablet   Oral   Take 2 tablets by mouth every 6 (six) hours as needed for pain.          . hydroxypropyl methylcellulose (ISOPTO TEARS) 2.5 % ophthalmic solution   Both Eyes   Place 1 drop into both eyes 2 (two) times daily as needed.          . hydrOXYzine (ATARAX/VISTARIL) 10 MG tablet   Oral   Take 10 mg by mouth every 8 (eight) hours as needed for itching.         . insulin glargine (LANTUS) 100 UNIT/ML injection   Subcutaneous   Inject 100 Units into  the skin 2 (two) times daily.          Marland Kitchen  ipratropium-albuterol (DUONEB) 0.5-2.5 (3) MG/3ML SOLN   Nebulization   Take 3 mLs by nebulization every 6 (six) hours as needed (shortness of breath).         . lansoprazole (PREVACID) 30 MG capsule   Oral   Take 30 mg by mouth daily.         Marland Kitchen lidocaine (LIDODERM) 5 %   Transdermal   Place 1 patch onto the skin daily. Remove & Discard patch within 12 hours or as directed by MD         . lisinopril (PRINIVIL,ZESTRIL) 2.5 MG tablet   Oral   Take 2.5 mg by mouth daily.          . Melatonin 3 MG TABS   Oral   Take 3 mg by mouth at bedtime.         . metoprolol succinate (TOPROL-XL) 25 MG 24 hr tablet   Oral   Take 25 mg by mouth daily.           Bernadette Hoit Sodium (SENNA S PO)   Oral   Take 1 tablet by mouth 2 (two) times daily.          . temazepam (RESTORIL) 15 MG capsule   Oral   Take 15 mg by mouth at bedtime as needed (for insomnia).           BP 159/43  Pulse 97  Temp(Src) 98.4 F (36.9 C) (Oral)  Resp 26  SpO2 94% Physical Exam  Nursing note and vitals reviewed. Constitutional: She appears well-developed and well-nourished.  HENT:  Head: Normocephalic and atraumatic.  Eyes: Conjunctivae are normal. Right eye exhibits no discharge. Left eye exhibits no discharge.  Neck: Normal range of motion. Neck supple. No tracheal deviation present.  Cardiovascular: Normal rate and regular rhythm.   Pulmonary/Chest: Effort normal and breath sounds normal.  Abdominal: Soft. She exhibits no distension. There is no tenderness. There is no guarding.  Musculoskeletal: She exhibits no edema.  Neurological: She is alert. GCS eye subscore is 4. GCS verbal subscore is 4. GCS motor subscore is 6.  General slow response, mild confusion Moves all ext equal bilateral with 4/5 strength EOMFI NO facial droop or drift Flat affect, left hand tremor mild with movement Gross sensation intact perrl Neck supple  Skin: Skin is warm. No rash noted.  Psychiatric:  She has a normal mood and affect.    ED Course  Procedures (including critical care time) Labs Review Labs Reviewed  CBC - Abnormal; Notable for the following:    MCHC 36.1 (*)    All other components within normal limits  COMPREHENSIVE METABOLIC PANEL - Abnormal; Notable for the following:    Glucose, Bld 281 (*)    Albumin 3.2 (*)    AST 158 (*)    ALT 185 (*)    Alkaline Phosphatase 432 (*)    Total Bilirubin 3.1 (*)    All other components within normal limits  GLUCOSE, CAPILLARY - Abnormal; Notable for the following:    Glucose-Capillary 272 (*)    All other components within normal limits  POCT I-STAT, CHEM 8 - Abnormal; Notable for the following:    Glucose, Bld 266 (*)    All other components within normal limits  ETHANOL  PROTIME-INR  DIFFERENTIAL  TROPONIN I  URINE RAPID DRUG SCREEN (HOSP PERFORMED)  URINALYSIS, ROUTINE W REFLEX MICROSCOPIC  POCT  I-STAT TROPONIN I   Imaging Review Ct Head Wo Contrast  10/28/2012   CLINICAL DATA:  Code stroke, disequilibrium.  EXAM: CT HEAD WITHOUT CONTRAST  TECHNIQUE: Contiguous axial images were obtained from the base of the skull through the vertex without intravenous contrast.  COMPARISON:  10/17/2010  FINDINGS: Atherosclerotic and physiologic intracranial calcifications. Stable small lacunar infarct in the left basal ganglia. Mild atrophy. There is no evidence of acute intracranial hemorrhage, brain edema, mass lesion, acute infarction, mass effect, or midline shift. Acute infarct may be in apparent on noncontrast CT. No other intra-axial abnormalities are seen, and the ventricles and sulci are within normal limits in size and symmetry. No abnormal extra-axial fluid collections or masses are identified. No significant calvarial abnormality. :  IMPRESSION: Negative for bleed or other acute intracranial process.  Critical Value/emergent results were called by telephone at the time of interpretation on 10/28/2012 at 7:57 PM to Dr.Camilo,  who verbally acknowledged these results.   Electronically Signed   By: Oley Balm M.D.   On: 10/28/2012 19:57    EKG Interpretation     Ventricular Rate:  90 PR Interval:  150 QRS Duration: 83 QT Interval:  354 QTC Calculation: 433 R Axis:   69 Text Interpretation:  Sinus rhythm No acute findings            MDM  No diagnosis found. Code stroke. Pt evaluated immediately on arrival once entered ED, no focal deficits, global weakness and mild confusion. Neuro at bedside with myself. Stat CT head no acute findings, MRI Concern for new LFT elevation and bili, contrast allergy, [place] abd. EKG no acute findings.  Rechecked, general weakness.  No indication for TPA at this time.   LFT elevated, pt has no abd pain, denies pain and denies vomiting or fevers.   With dilated CBD and sludge, likely choledocholithiasis.   UA pending. Spoke with Dr Toniann Fail, okay with tele.  Rechecked, neuro exam similar.  The patients results and plan were reviewed and discussed.   Any x-rays performed were personally reviewed by myself.   Differential diagnosis were considered with the presenting HPI.  Diagnosis: Confusion, LFT elevation, hyperbilirubinemia, hyperglycemia  EKG: no acute findings Pt will require GI consult inpt.  Admission/ observation were discussed with the admitting physician, patient and/or family and they are comfortable with the plan.    Enid Skeens, MD 10/28/12 [phone]

## 2012-10-28 NOTE — Consult Note (Signed)
Referring Physician: ED    Chief Complaint: CODE STROKE: POOR RESPONSIVENESS AND IMBALANCE  HPI:                                                                                                                                         Katherine Carr is an 72 y.o. female with a past medical history significant for HTN, DM, left carotid stenosis, asthma, GERD, depression, brought to [facility] ED by EMS as a code stroke due to acute onset decreased responsiveness and inability to maintain her balance. She is a nursing home resident and was reportedly as last seen well around 430 this afternoon when her nurse was about to administer her medications and noted the above mentioned symptoms. When EMS got to the scene she was less responsive but without frank focal weakness, slurred speech, double vision, HA, chest pain.Stated that her balance has been off for a while, had had multiple falls, and is essentially wheelchair bound.  Had NIHSS 2 OR 3 upon initial evaluation in the ED and CT bain showed no acute abnormality. Low grade fever.  Date last known well:  Time last known well:  tPA Given: no, NIHSS 2 or 3. NIHSS: 2 or 3   Past Medical History  Diagnosis Date  . Depression   . Diabetes mellitus   . Hypertension   . Peripheral neuropathy   . Diverticulosis   . Urinary incontinence   . Herpes   . Ovarian cyst   . H/O myomectomy   . Fibrous breast lumps   . Wears glasses   . Fibromyalgia   . GERD (gastroesophageal reflux disease)   . COPD (chronic obstructive pulmonary disease)   . Asthma     bronchial  . Fatty liver     hx: of  . Anxiety   . Left carotid stenosis     Hx: of    Past Surgical History  Procedure Laterality Date  . Back surgery      lower  . Hand surgery      bilateral carpal tunnel surgery  . Cesarean section    . Abdominal hysterectomy    . Myomectomy    . Breast lumpectomy with needle localization Right 04/02/2012    Procedure: Excision Ductal System Right Breast  with Needle Localization;  Surgeon: [name], MD;  Location: [facility];  Service: General;  Laterality: Right;  needle localization at 7:30 at [facility]   . Nevus excision Left 04/02/2012    Procedure: Excision Nevus Left Abdominal Wall;  Surgeon: [name], MD;  Location: [facility];  Service: General;  Laterality: Left;  . Breast lumpectomy Right 04/21/2012    Procedure: Right breast LUMPECTOMY with re-excision of margins;  Surgeon: [name], MD;  Location: [facility] OR;  Service: General;  Laterality: Right;    Family History  Problem Relation Age of Onset  .  Heart disease Mother   . Heart disease Brother   . Diabetes Brother   . Arthritis Sister   . Stroke Sister   . Cirrhosis Brother   . Cancer Brother     Bladder cancer   Social History:  reports that she quit smoking about 16 years ago. Her smoking use included Cigarettes. She smoked 0.00 packs per day for 20 years. She has never used smokeless tobacco. She reports that she does not drink alcohol or use illicit drugs.  Allergies:  Allergies  Allergen Reactions  . Sertraline Hcl Hives  . Codeine Hives and Swelling  . Cyclobenzaprine Hcl Hives and Swelling  . Fentanyl     REACTION: PANIC ATTACKS  . Furosemide     REACTION: face was swollen and hand  . Gadolinium Derivatives Swelling  . Hydrocodone-Acetaminophen   . Iohexol      Code: SOB, Desc: xray dye, Onset Date: [id number]   . Levofloxacin     REACTION: PANIC ATTACK,DIARRHEA,EYES DISCOLORD  . Lorazepam Nausea And Vomiting  . Lubiprostone     REACTION: REALLY BAD NAUSEA  . Metformin Hives and Swelling  . Metoclopramide Nausea And Vomiting and Swelling  . Morphine     REACTION: itching, panic attacks  . Oxycodone Hives and Swelling  . Promethazine Hcl     REACTION: TRAMA ANDJITTERS IN MY BODY  . Rosiglitazone Hives and Swelling  . Sertraline Hcl Hives and Swelling  . Verapamil Hives and Nausea And Vomiting    Medications:                                                                                                                            I have reviewed the patient's current medications.  ROS:                                                                                                                                       History obtained from chart review and patient  General ROS: negative for - chills, fatigue, fever, night sweats, weight gain or weight loss Psychological ROS: negative for - behavioral disorder, hallucinations, memory difficulties, mood swings or suicidal ideation Ophthalmic ROS: negative for - blurry vision, double vision, eye pain or loss of vision ENT ROS: negative for - epistaxis, nasal discharge, oral lesions, sore throat, tinnitus or vertigo Allergy and Immunology ROS: negative for - hives  or itchy/watery eyes Hematological and Lymphatic ROS: negative for - bleeding problems, bruising or swollen lymph nodes Endocrine ROS: negative for - galactorrhea, hair pattern changes, polydipsia/polyuria or temperature intolerance Respiratory ROS: negative for - cough, hemoptysis, shortness of breath or wheezing Cardiovascular ROS: negative for - chest pain, dyspnea on exertion, edema or irregular heartbeat Gastrointestinal ROS: negative for - abdominal pain, diarrhea, hematemesis, nausea/vomiting or stool incontinence Genito-Urinary ROS: negative for - dysuria, hematuria,  Musculoskeletal ROS: negative for - joint swelling or muscular weakness Neurological ROS: as noted in HPI Dermatological ROS: negative for rash and skin lesion changes    Physical exam: pleasant, no apparent distress. Blood pressure 161/79, temperature 99 F (37.2 C), temperature source Oral, resp. rate 16, SpO2 93.00%. Head: normocephalic. Neck: supple, no bruits, no JVD. Cardiac: no murmurs. Lungs: clear. Abdomen: soft, no tender, no mass. Extremities: no edema.   Neurologic Examination:                                                                                                       Mental Status: Alert, awake, disoriented to place-year-month. No frank aphasia or dysarthria.  Follows commands but slow to respond. Cranial Nerves: II: Discs flat bilaterally; Visual fields grossly normal, pupils equal, round, reactive to light and accommodation III,IV, VI: ptosis not present, extra-ocular motions intact bilaterally V,VII: smile symmetric, facial light touch sensation normal bilaterally VIII: hearing normal bilaterally IX,X: gag reflex present XI: bilateral shoulder shrug XII: midline tongue extension without atrophy or fasciculations Motor: Moves all extremities spontaneously and symmetrically Tone: cogwheeling rigidity left hand/arm. Sensory: Pinprick and light touch intact throughout, bilaterally Deep Tendon Reflexes:  1 + all over Plantars: Right: downgoing   Left: downgoing Cerebellar: normal finger-to-nose,  normal heel-to-shin test. There is a resting, pill-rolling tremor left hand. Gait: No tested. CV: pulses palpable throughout    Results for orders placed during the hospital encounter of 10/28/12 (from the past 48 hour(s))  PROTIME-INR     Status: None   Collection Time    10/28/12  7:38 PM      Result Value Range   Prothrombin Time 12.2  11.6 - 15.2 seconds   INR 0.92  0.00 - 1.49  CBC     Status: Abnormal   Collection Time    10/28/12  7:38 PM      Result Value Range   WBC 8.2  4.0 - 10.5 K/uL   RBC 4.54  3.87 - 5.11 MIL/uL   Hemoglobin 14.5  12.0 - 15.0 g/dL   HCT 87.5  64.3 - 32.9 %   MCV 88.5  78.0 - 100.0 fL   MCH 31.9  26.0 - 34.0 pg   MCHC 36.1 (*) 30.0 - 36.0 g/dL   RDW 51.8  84.1 - 66.0 %   Platelets 179  150 - 400 K/uL  DIFFERENTIAL     Status: None   Collection Time    10/28/12  7:38 PM      Result Value Range   Neutrophils Relative % 77  43 - 77 %   Neutro Abs  6.3  1.7 - 7.7 K/uL   Lymphocytes Relative 14  12 - 46 %   Lymphs Abs 1.1  0.7 - 4.0 K/uL    Monocytes Relative 8  3 - 12 %   Monocytes Absolute 0.6  0.1 - 1.0 K/uL   Eosinophils Relative 2  0 - 5 %   Eosinophils Absolute 0.1  0.0 - 0.7 K/uL   Basophils Relative 0  0 - 1 %   Basophils Absolute 0.0  0.0 - 0.1 K/uL  POCT I-STAT TROPONIN I     Status: None   Collection Time    10/28/12  7:47 PM      Result Value Range   Troponin i, poc 0.00  0.00 - 0.08 ng/mL   Comment 3            Comment: Due to the release kinetics of cTnI,     a negative result within the first hours     of the onset of symptoms does not rule out     myocardial infarction with certainty.     If myocardial infarction is still suspected,     repeat the test at appropriate intervals.  POCT I-STAT, CHEM 8     Status: Abnormal   Collection Time    10/28/12  7:49 PM      Result Value Range   Sodium 138  135 - 145 mEq/L   Potassium 4.3  3.5 - 5.1 mEq/L   Chloride 98  96 - 112 mEq/L   BUN 17  6 - 23 mg/dL   Creatinine, Ser 4.09  0.50 - 1.10 mg/dL   Glucose, Bld 811 (*) 70 - 99 mg/dL   Calcium, Ion 9.14  7.82 - 1.30 mmol/L   TCO2 27  0 - 100 mmol/L   Hemoglobin 15.0  12.0 - 15.0 g/dL   HCT 95.6  21.3 - 08.6 %  GLUCOSE, CAPILLARY     Status: Abnormal   Collection Time    10/28/12  7:59 PM      Result Value Range   Glucose-Capillary 272 (*) 70 - 99 mg/dL   Comment 1 Documented in Chart     Comment 2 Notify RN     Ct Head Wo Contrast  10/28/2012   CLINICAL DATA:  Code stroke, disequilibrium.  EXAM: CT HEAD WITHOUT CONTRAST  TECHNIQUE: Contiguous axial images were obtained from the base of the skull through the vertex without intravenous contrast.  COMPARISON:  10/17/2010  FINDINGS: Atherosclerotic and physiologic intracranial calcifications. Stable small lacunar infarct in the left basal ganglia. Mild atrophy. There is no evidence of acute intracranial hemorrhage, brain edema, mass lesion, acute infarction, mass effect, or midline shift. Acute infarct may be in apparent on noncontrast CT. No other  intra-axial abnormalities are seen, and the ventricles and sulci are within normal limits in size and symmetry. No abnormal extra-axial fluid collections or masses are identified. No significant calvarial abnormality. :  IMPRESSION: Negative for bleed or other acute intracranial process.  Critical Value/emergent results were called by telephone at the time of interpretation on 10/28/2012 at 7:57 PM to Dr.Bree Heinzelman, who verbally acknowledged these results.   Electronically Signed   By: Oley Balm M.D.   On: 10/28/2012 19:57     [facility] [phone]  10/28/2012, 8:14 PM   Assessment: 72 y.o. female brought to [facility] ED with decreased responsiveness and " inability to maintain her balance". NIHSS 2 or 3. CT brain showed no acute intracranial abnormality. Can not  entirely exclude stroke as she has risk factors, but she also has parkinsonian features on neuro-exam and low grade fever that could explain her current presentation. Therefore, taking into consideration her low NIHSS and the possibility of PD with superimposed infection mimicking stroke I decided not to offer thrombolytic treatment. Admit to medicine. ASA.  Get MRI-DWI and then decide whether or not further stroke work up is needed.  Stroke Risk Factors - HTN, DM, carotid stenosis    Wyatt Portela, MD [facility] [phone]  10/28/2012, 8:14 PM

## 2012-10-29 ENCOUNTER — Encounter (HOSPITAL_COMMUNITY): Payer: Self-pay | Admitting: General Practice

## 2012-10-29 DIAGNOSIS — R17 Unspecified jaundice: Secondary | ICD-10-CM

## 2012-10-29 DIAGNOSIS — R7989 Other specified abnormal findings of blood chemistry: Secondary | ICD-10-CM

## 2012-10-29 DIAGNOSIS — G9340 Encephalopathy, unspecified: Secondary | ICD-10-CM | POA: Diagnosis present

## 2012-10-29 DIAGNOSIS — E119 Type 2 diabetes mellitus without complications: Secondary | ICD-10-CM

## 2012-10-29 DIAGNOSIS — K838 Other specified diseases of biliary tract: Secondary | ICD-10-CM

## 2012-10-29 LAB — CBC WITH DIFFERENTIAL/PLATELET
Basophils Relative: 0 % (ref 0–1)
Eosinophils Relative: 2 % (ref 0–5)
HCT: 35.8 % — ABNORMAL LOW (ref 36.0–46.0)
Lymphocytes Relative: 22 % (ref 12–46)
Lymphs Abs: 1.8 10*3/uL (ref 0.7–4.0)
MCV: 88.6 fL (ref 78.0–100.0)
Monocytes Absolute: 0.5 10*3/uL (ref 0.1–1.0)
Monocytes Relative: 7 % (ref 3–12)
Neutrophils Relative %: 68 % (ref 43–77)
RBC: 4.04 MIL/uL (ref 3.87–5.11)
WBC: 8 10*3/uL (ref 4.0–10.5)

## 2012-10-29 LAB — RAPID URINE DRUG SCREEN, HOSP PERFORMED
Amphetamines: NOT DETECTED
Barbiturates: NOT DETECTED
Benzodiazepines: POSITIVE — AB
Cocaine: NOT DETECTED
Opiates: POSITIVE — AB
Tetrahydrocannabinol: NOT DETECTED

## 2012-10-29 LAB — URINALYSIS, ROUTINE W REFLEX MICROSCOPIC
Glucose, UA: >1000 mg/dL — AB
Ketones, ur: 15 mg/dL — AB
Nitrite: POSITIVE — AB
Protein, ur: >300 mg/dL — AB
Specific Gravity, Urine: 1.033 — ABNORMAL HIGH (ref 1.005–1.030)
Urobilinogen, UA: >8 mg/dL — ABNORMAL HIGH (ref 0.0–1.0)
pH: 5 (ref 5.0–8.0)

## 2012-10-29 LAB — BLOOD GAS, ARTERIAL
Acid-Base Excess: 3.4 mmol/L — ABNORMAL HIGH (ref 0.0–2.0)
Bicarbonate: 27.3 meq/L — ABNORMAL HIGH (ref 20.0–24.0)
Drawn by: 10006
FIO2: 0.21 %
O2 Saturation: 94.2 %
Patient temperature: 100
TCO2: 28.6 mmol/L (ref 0–100)
pCO2 arterial: 42.2 mmHg (ref 35.0–45.0)
pH, Arterial: 7.431 (ref 7.350–7.450)
pO2, Arterial: 69.6 mmHg — ABNORMAL LOW (ref 80.0–100.0)

## 2012-10-29 LAB — URINE MICROSCOPIC-ADD ON

## 2012-10-29 LAB — HEPATIC FUNCTION PANEL
ALT: 142 U/L — ABNORMAL HIGH (ref 0–35)
Albumin: 2.8 g/dL — ABNORMAL LOW (ref 3.5–5.2)
Indirect Bilirubin: 0.6 mg/dL (ref 0.3–0.9)
Total Protein: 6.3 g/dL (ref 6.0–8.3)

## 2012-10-29 LAB — BASIC METABOLIC PANEL
CO2: 26 mEq/L (ref 19–32)
Chloride: 101 mEq/L (ref 96–112)
Sodium: 136 mEq/L (ref 135–145)

## 2012-10-29 LAB — TSH: TSH: 0.62 u[IU]/mL (ref 0.350–4.500)

## 2012-10-29 LAB — HEPATITIS PANEL, ACUTE
HCV Ab: NEGATIVE
Hep A IgM: NONREACTIVE
Hep B C IgM: NONREACTIVE
Hepatitis B Surface Ag: NEGATIVE

## 2012-10-29 LAB — GLUCOSE, CAPILLARY: Glucose-Capillary: 203 mg/dL — ABNORMAL HIGH (ref 70–99)

## 2012-10-29 LAB — AMMONIA: Ammonia: 33 umol/L (ref 11–60)

## 2012-10-29 LAB — ACETAMINOPHEN LEVEL: Acetaminophen (Tylenol), Serum: <15 ug/mL (ref 10–30)

## 2012-10-29 MED ORDER — SENNOSIDES-DOCUSATE SODIUM 8.6-50 MG PO TABS: 1.0000 | ORAL_TABLET | Freq: Every evening | ORAL | Status: DC | PRN

## 2012-10-29 MED ORDER — HYDROXYZINE HCL 10 MG PO TABS
10.0000 mg | ORAL_TABLET | Freq: Three times a day (TID) | ORAL | Status: DC | PRN
  Filled 2012-10-29: qty 1

## 2012-10-29 MED ORDER — ARIPIPRAZOLE 5 MG PO TABS: 5.0000 mg | ORAL_TABLET | Freq: Every day | ORAL | Status: DC

## 2012-10-29 MED ORDER — SODIUM CHLORIDE 0.9 % IV SOLN
INTRAVENOUS | Status: AC
  Administered 2012-10-29: 10:00:00 via INTRAVENOUS

## 2012-10-29 MED ORDER — ONDANSETRON HCL 4 MG PO TABS: 4.0000 mg | ORAL_TABLET | Freq: Four times a day (QID) | ORAL | Status: DC | PRN

## 2012-10-29 MED ORDER — IPRATROPIUM BROMIDE 0.02 % IN SOLN: 0.5000 mg | Freq: Four times a day (QID) | RESPIRATORY_TRACT | Status: DC | PRN

## 2012-10-29 MED ORDER — FLUTICASONE PROPIONATE 50 MCG/ACT NA SUSP: 2.0000 | Freq: Every day | NASAL | Status: DC | PRN

## 2012-10-29 MED ORDER — ONDANSETRON HCL 4 MG/2ML IJ SOLN: 4.0000 mg | Freq: Four times a day (QID) | INTRAMUSCULAR | Status: DC | PRN

## 2012-10-29 MED ORDER — DULOXETINE HCL 60 MG PO CPEP
60.0000 mg | ORAL_CAPSULE | Freq: Two times a day (BID) | ORAL | Status: DC
  Administered 2012-10-29 – 2012-10-30 (×4): 60 mg via ORAL
  Filled 2012-10-29 (×5): qty 1

## 2012-10-29 MED ORDER — ARIPIPRAZOLE 5 MG PO TABS
7.0000 mg | ORAL_TABLET | Freq: Every day | ORAL | Status: DC
  Administered 2012-10-29 (×2): 7 mg via ORAL
  Filled 2012-10-29 (×3): qty 1

## 2012-10-29 MED ORDER — LORATADINE 10 MG PO TABS
10.0000 mg | ORAL_TABLET | Freq: Every day | ORAL | Status: DC
  Administered 2012-10-29 – 2012-10-30 (×2): 10 mg via ORAL
  Filled 2012-10-29 (×2): qty 1

## 2012-10-29 MED ORDER — PANTOPRAZOLE SODIUM 40 MG PO TBEC
40.0000 mg | DELAYED_RELEASE_TABLET | Freq: Every day | ORAL | Status: DC
  Administered 2012-10-29 – 2012-10-30 (×2): 40 mg via ORAL
  Filled 2012-10-29 (×2): qty 1

## 2012-10-29 MED ORDER — IPRATROPIUM-ALBUTEROL 0.5-2.5 (3) MG/3ML IN SOLN: 3.0000 mL | Freq: Four times a day (QID) | RESPIRATORY_TRACT | Status: DC | PRN

## 2012-10-29 MED ORDER — METOPROLOL SUCCINATE ER 25 MG PO TB24
25.0000 mg | ORAL_TABLET | Freq: Every day | ORAL | Status: DC
  Administered 2012-10-29 – 2012-10-30 (×2): 25 mg via ORAL
  Filled 2012-10-29 (×2): qty 1

## 2012-10-29 MED ORDER — PIPERACILLIN-TAZOBACTAM 3.375 G IVPB
3.3750 g | Freq: Three times a day (TID) | INTRAVENOUS | Status: DC
  Administered 2012-10-29 – 2012-10-30 (×4): 3.375 g via INTRAVENOUS
  Filled 2012-10-29 (×7): qty 50

## 2012-10-29 MED ORDER — SODIUM CHLORIDE 0.9 % IJ SOLN
3.0000 mL | Freq: Two times a day (BID) | INTRAMUSCULAR | Status: DC
  Administered 2012-10-29: 10:00:00 via INTRAVENOUS

## 2012-10-29 MED ORDER — LISINOPRIL 2.5 MG PO TABS
2.5000 mg | ORAL_TABLET | Freq: Every day | ORAL | Status: DC
  Administered 2012-10-29 – 2012-10-30 (×2): 2.5 mg via ORAL
  Filled 2012-10-29 (×2): qty 1

## 2012-10-29 MED ORDER — ALBUTEROL SULFATE (5 MG/ML) 0.5% IN NEBU: 2.5000 mg | INHALATION_SOLUTION | Freq: Four times a day (QID) | RESPIRATORY_TRACT | Status: DC | PRN

## 2012-10-29 MED ORDER — ARIPIPRAZOLE 2 MG PO TABS: 2.0000 mg | ORAL_TABLET | Freq: Every day | ORAL | Status: DC

## 2012-10-29 MED ORDER — ANASTROZOLE 1 MG PO TABS
1.0000 mg | ORAL_TABLET | Freq: Every day | ORAL | Status: DC
  Administered 2012-10-29 – 2012-10-30 (×2): 1 mg via ORAL
  Filled 2012-10-29 (×2): qty 1

## 2012-10-29 MED ORDER — CARBIDOPA-LEVODOPA 25-100 MG PO TABS
0.5000 | ORAL_TABLET | Freq: Three times a day (TID) | ORAL | Status: DC
  Administered 2012-10-29 – 2012-10-30 (×4): 0.5 via ORAL
  Filled 2012-10-29 (×6): qty 0.5

## 2012-10-29 MED ORDER — INSULIN GLARGINE 100 UNIT/ML ~~LOC~~ SOLN
30.0000 [IU] | Freq: Two times a day (BID) | SUBCUTANEOUS | Status: DC
  Administered 2012-10-29 – 2012-10-30 (×3): 30 [IU] via SUBCUTANEOUS
  Filled 2012-10-29 (×5): qty 0.3

## 2012-10-29 MED ORDER — INSULIN ASPART 100 UNIT/ML ~~LOC~~ SOLN
0.0000 [IU] | Freq: Three times a day (TID) | SUBCUTANEOUS | Status: DC
  Administered 2012-10-29: 2 [IU] via SUBCUTANEOUS
  Administered 2012-10-29: 1 [IU] via SUBCUTANEOUS
  Administered 2012-10-29: 2 [IU] via SUBCUTANEOUS
  Administered 2012-10-30: 3 [IU] via SUBCUTANEOUS
  Administered 2012-10-30: 1 [IU] via SUBCUTANEOUS

## 2012-10-29 MED ORDER — LORAZEPAM 2 MG/ML IJ SOLN: 0.5000 mg | Freq: Once | INTRAMUSCULAR | Status: DC

## 2012-10-29 MED ORDER — INSULIN GLARGINE 100 UNIT/ML ~~LOC~~ SOLN
50.0000 [IU] | Freq: Two times a day (BID) | SUBCUTANEOUS | Status: DC
  Administered 2012-10-29: 50 [IU] via SUBCUTANEOUS
  Filled 2012-10-29 (×2): qty 0.5

## 2012-10-29 MED ORDER — HYPROMELLOSE (GONIOSCOPIC) 2.5 % OP SOLN: 1.0000 [drp] | Freq: Two times a day (BID) | OPHTHALMIC | Status: DC | PRN

## 2012-10-29 MED ORDER — GABAPENTIN 600 MG PO TABS
600.0000 mg | ORAL_TABLET | Freq: Three times a day (TID) | ORAL | Status: DC
  Administered 2012-10-29 – 2012-10-30 (×5): 600 mg via ORAL
  Filled 2012-10-29 (×7): qty 1

## 2012-10-29 MED ORDER — FENTANYL CITRATE 0.05 MG/ML IJ SOLN: 25.0000 ug | Freq: Once | INTRAMUSCULAR | Status: DC

## 2012-10-29 NOTE — Progress Notes (Signed)
Patient admitted early this AM by Dr. Kirtland Bouchard- please see H&P.  MRI negative for acute CVA- LFTS trending down- no abd pain- patient asking to eat.  Seems to be at baseline- will get urine culture- done after abx given.  Marlin Canary DO

## 2012-10-29 NOTE — Progress Notes (Signed)
While providing perineal care and bathroom assistance, noted that patient had a small blister on rt buttock near perineal area. The area around the blister was very hard. When squeezed quite a bit of drainage was expelled. Pt stated this area has been there for "about a week." She stated that she "never told anyone about it." MD notified. Will continue to monitor.

## 2012-10-29 NOTE — Progress Notes (Signed)
   CARE MANAGEMENT NOTE 10/29/2012  Patient:  Katherine Carr, Katherine Carr   Account Number:  [id number]  Date Initiated:  10/29/2012  Documentation initiated by:  Jiles Crocker  Subjective/Objective Assessment:   ADMITTED WITH ACUTE ENCEPHALOPATHY     Action/Plan:   PATIENT RESIDES IN A NURSING FACILITY; SOC WORKER REFERRAL PLACED   Anticipated DC Date:  11/05/2012   Anticipated DC Plan:  SKILLED NURSING FACILITY  In-house referral  Clinical Social Worker      DC Planning Services  CM consult          Status of service:  In process, will continue to follow Medicare Important Message given?  NA - LOS <3 / Initial given by admissions (If response is "NO", the following Medicare IM given date fields will be blank)  Per UR Regulation:  Reviewed for med. necessity/level of care/duration of stay  Comments:  10/29/2014Abelino Derrick RN,BSN,MHA [phone]

## 2012-10-29 NOTE — H&P (Signed)
[facility] History and Physical  FELESIA STAHLECKER Carr:[id number] DOB: 02-Oct-1940 DOA: 10/28/2012  Referring physician: ER physician. PCP: Georgann Housekeeper, MD   Chief Complaint: Increasing confusion.  HPI: Katherine Carr is a 72 y.o. female was brought from the nursing home after patient was found to be increasingly confused with leaning towards left. Patient was brought to the ER suspecting stroke. CT head did not show anything acute and on exam patient was nonfocal but lethargic. Neurology was consulted and as per Dr. Cyril Mourning MRI brain was ordered and if negative no further stroke workup. In the ER patient's labs showed elevated LFTs and sonogram was done which showed dilated CBD. Patient otherwise denies any chest pain or shortness of breath nausea vomiting abdominal pain or diarrhea. Patient is still drowsy but answers questions appropriately.   Review of Systems: As presented in the history of presenting illness, rest negative.  Past Medical History  Diagnosis Date  . Depression   . Diabetes mellitus   . Hypertension   . Peripheral neuropathy   . Diverticulosis   . Urinary incontinence   . Herpes   . Ovarian cyst   . Fibrous breast lumps   . Wears glasses   . Fibromyalgia   . GERD (gastroesophageal reflux disease)   . COPD (chronic obstructive pulmonary disease)   . Asthma     bronchial  . Fatty liver     hx: of  . Anxiety   . Left carotid stenosis     Hx: of  . Breast cancer     "both sides" (10/29/2012)  . Osteoarthritis     [name] 09/12/2007  (10/29/2012)  . H/O hiatal hernia     [name] 09/12/2007  (10/29/2012)  . Migraines     [name] 09/01/2001 (10/29/2012)   Past Surgical History  Procedure Laterality Date  . Carpal tunnel release Bilateral   . Cesarean section    . Abdominal hysterectomy    . Myomectomy    . Breast lumpectomy with needle localization Right 04/02/2012    Procedure: Excision Ductal System Right Breast with Needle Localization;   Surgeon: [name], MD;  Location: [facility];  Service: General;  Laterality: Right;  needle localization at 7:30 at [facility]   . Nevus excision Left 04/02/2012    Procedure: Excision Nevus Left Abdominal Wall;  Surgeon: [name], MD;  Location: [facility];  Service: General;  Laterality: Left;  . Breast lumpectomy Right 04/21/2012    Procedure: Right breast LUMPECTOMY with re-excision of margins;  Surgeon: [name], MD;  Location: [facility];  Service: General;  Laterality: Right;  . Tonsillectomy    . Appendectomy    . Breast ductal system excision Left     [name] 04/02/2012  (10/29/2012)  . Back surgery      central decompression L5-S1/notes 10/23/2005 (10/29/2012)  . Anterior cervical decomp/discectomy fusion  09/2003    [name] 09/25/2003 (10/29/2012)   Social History:  reports that she quit smoking about 16 years ago. Her smoking use included Cigarettes. She smoked 0.00 packs per day for 20 years. She has never used smokeless tobacco. She reports that she does not drink alcohol or use illicit drugs. Where does patient live nursing home. Can patient participate in ADLs? Yes.  Allergies  Allergen Reactions  . Sertraline Hcl Hives  . Codeine Hives and Swelling  . Cyclobenzaprine Hcl Hives and Swelling  . Fentanyl     REACTION: PANIC ATTACKS  . Furosemide  REACTION: face was swollen and hand  . Gadolinium Derivatives Swelling  . Hydrocodone-Acetaminophen   . Iohexol      Code: SOB, Desc: xray dye, Onset Date: [id number]   . Levofloxacin     REACTION: PANIC ATTACK,DIARRHEA,EYES DISCOLORD  . Lorazepam Nausea And Vomiting  . Lubiprostone     REACTION: REALLY BAD NAUSEA  . Metformin Hives and Swelling  . Metoclopramide Nausea And Vomiting and Swelling  . Morphine     REACTION: itching, panic attacks  . Oxycodone Hives and Swelling  . Promethazine Hcl     REACTION: TRAMA ANDJITTERS IN MY BODY  . Rosiglitazone Hives and Swelling  .  Sertraline Hcl Hives and Swelling  . Verapamil Hives and Nausea And Vomiting    Family History:  Family History  Problem Relation Age of Onset  . Heart disease Mother   . Heart disease Brother   . Diabetes Brother   . Arthritis Sister   . Stroke Sister   . Cirrhosis Brother   . Cancer Brother     Bladder cancer      Prior to Admission medications   Medication Sig Start Date End Date Taking? Authorizing Provider  ALPRAZolam Prudy Feeler) 0.5 MG tablet Take 0.5 mg by mouth 3 (three) times daily as needed for anxiety.    Yes Historical Provider, MD  alum & mag hydroxide-simeth (MAALOX/MYLANTA) 200-200-20 MG/5ML suspension Take 30 mLs by mouth every 4 (four) hours as needed for indigestion.   Yes Historical Provider, MD  anastrozole (ARIMIDEX) 1 MG tablet Take 1 mg by mouth daily.   Yes Historical Provider, MD  ARIPiprazole (ABILIFY) 2 MG tablet Take 2 mg by mouth daily. *takes with 5mg  tablet for 7mg  dose   Yes Historical Provider, MD  ARIPiprazole (ABILIFY) 5 MG tablet Take 5 mg by mouth at bedtime. *takes with 2mg  tablet for 7mg  dose   Yes Historical Provider, MD  atorvastatin (LIPITOR) 10 MG tablet Take 10 mg by mouth at bedtime.   Yes Historical Provider, MD  bisacodyl (DULCOLAX) 10 MG suppository Place 10 mg rectally daily as needed for constipation.    Yes Historical Provider, MD  cetirizine (ZYRTEC) 5 MG tablet Take 5 mg by mouth daily as needed for allergies.    Yes Historical Provider, MD  Cholecalciferol (VITAMIN D3) 1000 UNITS CAPS Take 2,000 Units by mouth daily.   Yes Historical Provider, MD  diclofenac sodium (VOLTAREN) 1 % GEL Apply 4 g topically 3 (three) times daily as needed (joint pain).   Yes Historical Provider, MD  DULoxetine (CYMBALTA) 60 MG capsule Take 60 mg by mouth 2 (two) times daily.   Yes Historical Provider, MD  fluticasone (FLONASE) 50 MCG/ACT nasal spray Place 2 sprays into the nose daily as needed for rhinitis or allergies.   Yes Historical Provider, MD   gabapentin (NEURONTIN) 600 MG tablet Take 600 mg by mouth 3 (three) times daily.     Yes Historical Provider, MD  HUMALOG 100 UNIT/ML injection Inject 48-84 Units into the skin 3 (three) times daily before meals. 70-150= 48 units, 151-200= 54 units, 201-250= 60 units, 251-300 =66 units, 301-350= 72 units, 351-400= 78 units, over 400= 84 units. 05/23/11  Yes Historical Provider, MD  HYDROcodone-acetaminophen (NORCO) 5-325 MG per tablet Take 2 tablets by mouth every 6 (six) hours as needed for pain.    Yes Historical Provider, MD  hydroxypropyl methylcellulose (ISOPTO TEARS) 2.5 % ophthalmic solution Place 1 drop into both eyes 2 (two) times daily as needed.  Yes Historical Provider, MD  hydrOXYzine (ATARAX/VISTARIL) 10 MG tablet Take 10 mg by mouth every 8 (eight) hours as needed for itching.   Yes Historical Provider, MD  insulin glargine (LANTUS) 100 UNIT/ML injection Inject 100 Units into the skin 2 (two) times daily.    Yes Historical Provider, MD  ipratropium-albuterol (DUONEB) 0.5-2.5 (3) MG/3ML SOLN Take 3 mLs by nebulization every 6 (six) hours as needed (shortness of breath).   Yes Historical Provider, MD  lansoprazole (PREVACID) 30 MG capsule Take 30 mg by mouth daily.   Yes Historical Provider, MD  lidocaine (LIDODERM) 5 % Place 1 patch onto the skin daily. Remove & Discard patch within 12 hours or as directed by MD   Yes Historical Provider, MD  lisinopril (PRINIVIL,ZESTRIL) 2.5 MG tablet Take 2.5 mg by mouth daily.  05/18/11  Yes Historical Provider, MD  Melatonin 3 MG TABS Take 3 mg by mouth at bedtime.   Yes Historical Provider, MD  metoprolol succinate (TOPROL-XL) 25 MG 24 hr tablet Take 25 mg by mouth daily.     Yes Historical Provider, MD  Sennosides-Docusate Sodium (SENNA S PO) Take 1 tablet by mouth 2 (two) times daily.    Yes Historical Provider, MD  temazepam (RESTORIL) 15 MG capsule Take 15 mg by mouth at bedtime as needed (for insomnia).  05/07/12  Yes Historical Provider, MD     Physical Exam: Filed Vitals:   10/28/12 2030 10/28/12 2045 10/28/12 2100 10/29/12 0030  BP: 152/70 163/91 159/43 147/65  Pulse: 94 96 97 91  Temp:    99.9 F (37.7 C)  TempSrc:    Oral  Resp: 21 25 26 18   Height:    5\' 4"  (1.626 m)  Weight:    87.9 kg (193 lb 12.6 oz)  SpO2: 94% 94% 94% 97%     General:  Well-developed well-nourished.  Eyes: Anicteric no pallor.  ENT: No discharge from ears eyes nose mouth.  Neck: No mass felt.  Cardiovascular: S1-S2 heard.  Respiratory: No rhonchi or crepitations.  Abdomen: Soft nontender bowel sounds present.  Skin: No rash.  Musculoskeletal: No edema.  Psychiatric: Patient is lethargic.  Neurologic: Patient follows commands and moves all extremities. Patient is oriented to her name and place.  Labs on Admission:  Basic Metabolic Panel:  Recent Labs Lab 10/28/12 1938 10/28/12 1949  NA 136 138  K 4.3 4.3  CL 98 98  CO2 29  --   GLUCOSE 281* 266*  BUN 17 17  CREATININE 0.52 0.80  CALCIUM 9.5  --    Liver Function Tests:  Recent Labs Lab 10/28/12 1938  AST 158*  ALT 185*  ALKPHOS 432*  BILITOT 3.1*  PROT 7.2  ALBUMIN 3.2*   No results found for this basename: LIPASE, AMYLASE,  in the last 168 hours  Recent Labs Lab 10/28/12 2350  AMMONIA 33   CBC:  Recent Labs Lab 10/28/12 1938 10/28/12 1949  WBC 8.2  --   NEUTROABS 6.3  --   HGB 14.5 15.0  HCT 40.2 44.0  MCV 88.5  --   PLT 179  --    Cardiac Enzymes:  Recent Labs Lab 10/28/12 1938  TROPONINI <0.30    BNP (last 3 results) No results found for this basename: PROBNP,  in the last 8760 hours CBG:  Recent Labs Lab 10/28/12 1959  GLUCAP 272*    Radiological Exams on Admission: Dg Chest 2 View  10/28/2012   CLINICAL DATA:  Code stroke, rule out pacemaker  pre MRI.  EXAM: CHEST - 2 VIEW  COMPARISON:  04/21/2012  FINDINGS: No pacemaker. Mild cardiomegaly stable. Both lungs are clear. The visualized skeletal structures are  unremarkable. No effusion. Cervical fixation hardware noted.  IMPRESSION: No acute cardiopulmonary disease.   Electronically Signed   By: Oley Balm M.D.   On: 10/28/2012 21:46   Ct Head Wo Contrast  10/28/2012   CLINICAL DATA:  Code stroke, disequilibrium.  EXAM: CT HEAD WITHOUT CONTRAST  TECHNIQUE: Contiguous axial images were obtained from the base of the skull through the vertex without intravenous contrast.  COMPARISON:  10/17/2010  FINDINGS: Atherosclerotic and physiologic intracranial calcifications. Stable small lacunar infarct in the left basal ganglia. Mild atrophy. There is no evidence of acute intracranial hemorrhage, brain edema, mass lesion, acute infarction, mass effect, or midline shift. Acute infarct may be in apparent on noncontrast CT. No other intra-axial abnormalities are seen, and the ventricles and sulci are within normal limits in size and symmetry. No abnormal extra-axial fluid collections or masses are identified. No significant calvarial abnormality. :  IMPRESSION: Negative for bleed or other acute intracranial process.  Critical Value/emergent results were called by telephone at the time of interpretation on 10/28/2012 at 7:57 PM to Dr.Camilo, who verbally acknowledged these results.   Electronically Signed   By: Oley Balm M.D.   On: 10/28/2012 19:57   Mr Brain Wo Contrast  10/29/2012   CLINICAL DATA:  Stroke, disequilibrium.  EXAM: MRI HEAD WITHOUT CONTRAST  TECHNIQUE: Multiplanar, multisequence MR imaging was performed. No intravenous contrast was administered.  COMPARISON:  CT of the head October 28, 2012 at 1946 hr.  FINDINGS: No reduced diffusion to suggest acute ischemia. No susceptibility artifact to suggest acute hemorrhage.  The ventricles and sulci are normal for patient's age. A few scattered subcentimeter supratentorial white matter T2 hyperintensities are less than expected for age, with minimal patchy pontine T2 hyperintensities. No midline shift, mass  effect nor mass lesions.  No abnormal extra-axial fluid collections. Normal major intracranial vascular flow voids observed at the skull base.  Status post bilateral ocular lens implants. No abnormal sellar expansion, convex margin of the pituitary gland noted. Craniocervical junction maintained. Partially imaged upper cervical ACDF. Minimal T2 signal within the mastoid air cells, no abnormal fullness of the nasopharynx. Paranasal sinuses appear well aerated.  IMPRESSION: No evidence of acute ischemia. Normal noncontrast MRI of the brain for age, including mild white matter changes suggesting chronic small vessel ischemic disease, involutional changes.   Electronically Signed   By: Awilda Metro   On: 10/29/2012 00:18   US Abdomen Complete  10/28/2012   CLINICAL DATA:  Elevated bilirubin and LFTs.  EXAM: COMPLETE ABDOMINAL ULTRASOUND  COMPARISON:  CT 11/13/2009  FINDINGS: Gallbladder: Distended with layering sludge. No stones. No wall thickening. Sonographer reports no sonographic Murphy's sign.  Common bile duct: Dilated up to 10.1 mm diameter. No definite choledocholithiasis.  Liver: There is mild central intrahepatic biliary ductal dilatation suggested. The liver has a mildly inhomogeneous background echotexture without focal lesion.  IVC:  Negative  Pancreas:  Negative  Spleen:  10.4 cm craniocaudal length, unremarkable.  Right Kidney:  No mass or hydronephrosis, 12.2cm in length.  Left Kidney:  No lesion or hydronephrosis, 12.8cm in length.  Abdominal aorta:  Negative  IMPRESSION: 1. Distended gallbladder containing sludge, no additional ultrasound evidence of cholecystitis. 2. Intra and extrahepatic biliary ductal dilatation.   Electronically Signed   By: Oley Balm M.D.   On: 10/28/2012 23:13  Assessment/Plan Principal Problem:   Acute encephalopathy Active Problems:   DIABETES MELLITUS, TYPE II   HYPERLIPIDEMIA   Elevated LFTs   Common bile duct dilatation   1. Acute  encephalopathy - probably secondary to infectious cause secondary to UTI versus metabolic. Patient has been placed on empiric antibiotics for UTI and also since patient has possible CBD obstruction. Check ABG for any carbon dioxide retention. Hold patient's narcotics sedatives and pain relief medications for now. 2. Elevated LFTs with possible CBD dilatation - closely follow LFTs. Consult GI for further recommendations. Check acute hepatitis panel and Tylenol levels and INR. Patient at this time is mildly febrile but not talking septic. Patient has been placed on Zosyn. Closely observe as patient has possible obstruction with potential for cholangitis. We'll keep patient n.p.o. in anticipation of procedure. 3. UTI - follow urine cultures. Patient has been placed on antibiotics, Zosyn. 4. Diabetes mellitus type 2 - I have decreased patient's Lantus dose to half for now they're to patient's n.p.o. status. Closely follow CBG. 5. Hyperlipidemia - holding status due to elevated LFTs.    Code Status: Full code.  Family Communication: None.  Disposition Plan: Admit to inpatient.    Elverda Wendel N. [facility] Pager [phone].  If 7PM-7AM, please contact night-coverage [email or web address] Password [email or web address] 10/29/2012, 1:53 AM

## 2012-10-29 NOTE — ED Notes (Signed)
Code stroke called 1930 Phlebotomist arrival 1935 Patient arrival 62 Last know well 1630 EDP exam/Cleared for CT 1938 Stroke Team Arrival [phone] Neurologist arrival 1950 Pt arrival in CT 1940  Patient brought in by [facility]  NIHSS 2

## 2012-10-29 NOTE — Progress Notes (Signed)
NEURO HOSPITALIST PROGRESS NOTE   SUBJECTIVE:                                                                                                                        Patient is feeling "not good and weak all over". She also is complaining of "itching " over her chest which has been present for 2 weeks.   OBJECTIVE:                                                                                                                           Vital signs in last 24 hours: Temp:  [98.4 F (36.9 C)-100 F (37.8 C)] 99.9 F (37.7 C) (10/29 0529) Pulse Rate:  [89-97] 92 (10/29 0529) Resp:  [16-26] 18 (10/29 0529) BP: (129-173)/(43-91) 129/56 mmHg (10/29 0529) SpO2:  [93 %-97 %] 94 % (10/29 0529) Weight:  [87.9 kg (193 lb 12.6 oz)] 87.9 kg (193 lb 12.6 oz) (10/29 0030)  Intake/Output from previous day:   Intake/Output this shift:   Nutritional status: Carb Control  Past Medical History  Diagnosis Date  . Depression   . Diabetes mellitus   . Hypertension   . Peripheral neuropathy   . Diverticulosis   . Urinary incontinence   . Herpes   . Ovarian cyst   . Fibrous breast lumps   . Wears glasses   . Fibromyalgia   . GERD (gastroesophageal reflux disease)   . COPD (chronic obstructive pulmonary disease)   . Asthma     bronchial  . Fatty liver     hx: of  . Anxiety   . Left carotid stenosis     Hx: of  . Breast cancer     "both sides" (10/29/2012)  . Osteoarthritis     [name] 09/12/2007  (10/29/2012)  . H/O hiatal hernia     [name] 09/12/2007  (10/29/2012)  . Migraines     [name] 09/01/2001 (10/29/2012)     Neurologic Exam:  Mental Status:  Alert, awake, disoriented to place-year-month. No  aphasia or dysarthria. Follows commands but remains slow to respond.  Cranial Nerves:  II: Visual fields grossly normal, pupils equal, round, reactive to light and accommodation  III,IV, VI: ptosis not present, extra-ocular motions intact bilaterally  V,VII: smile symmetric, facial light touch sensation normal bilaterally  VIII: hearing normal bilaterally  IX,X: gag reflex present  XI: bilateral shoulder shrug  XII: midline tongue extension without atrophy or fasciculations  Motor:  Moves all extremities spontaneously UE 5/5 bilateral LE proximal hip flexion 3/5 and distal 2/5 . Patient does show a resting tremor bilaterally (more pronounced in the left hand), masked facial expression, cog wheel rigidity Tone: cogwheeling rigidity left hand/arm.  Sensory: Pinprick and light touch intact throughout, bilaterally  Deep Tendon Reflexes:  1 + all over  Plantars:  Right: downgoing Left: downgoing  Cerebellar:  normal finger-to-nose, normal heel-to-shin test. There is a resting, pill-rolling tremor left hand.  Gait:  No tested.  CV: pulses palpable throughout    Lab Results: Lab Results  Component Value Date/Time   CHOL  Value: 213        ATP III CLASSIFICATION:  <200     mg/dL   Desirable  161-096  mg/dL   Borderline High  >=045    mg/dL   High       * 4/0/9811  4:30 AM   Lipid Panel No results found for this basename: CHOL, TRIG, HDL, CHOLHDL, VLDL, LDLCALC,  in the last 72 hours  Studies/Results: Dg Chest 2 View  10/28/2012   CLINICAL DATA:  Code stroke, rule out pacemaker pre MRI.  EXAM: CHEST - 2 VIEW  COMPARISON:  04/21/2012  FINDINGS: No pacemaker. Mild cardiomegaly stable. Both lungs are clear. The visualized skeletal structures are unremarkable. No effusion. Cervical fixation hardware noted.  IMPRESSION: No acute cardiopulmonary disease.   Electronically Signed   By: Oley Balm M.D.   On: 10/28/2012 21:46   Ct Head Wo Contrast  10/28/2012   CLINICAL DATA:  Code stroke, disequilibrium.  EXAM: CT HEAD WITHOUT CONTRAST  TECHNIQUE: Contiguous axial images were obtained from the base of the skull through the vertex without intravenous contrast.  COMPARISON:  10/17/2010  FINDINGS: Atherosclerotic and physiologic intracranial  calcifications. Stable small lacunar infarct in the left basal ganglia. Mild atrophy. There is no evidence of acute intracranial hemorrhage, brain edema, mass lesion, acute infarction, mass effect, or midline shift. Acute infarct may be in apparent on noncontrast CT. No other intra-axial abnormalities are seen, and the ventricles and sulci are within normal limits in size and symmetry. No abnormal extra-axial fluid collections or masses are identified. No significant calvarial abnormality. :  IMPRESSION: Negative for bleed or other acute intracranial process.  Critical Value/emergent results were called by telephone at the time of interpretation on 10/28/2012 at 7:57 PM to Dr.Camilo, who verbally acknowledged these results.   Electronically Signed   By: Oley Balm M.D.   On: 10/28/2012 19:57   Mr Brain Wo Contrast  10/29/2012   CLINICAL DATA:  Stroke, disequilibrium.  EXAM: MRI HEAD WITHOUT CONTRAST  TECHNIQUE: Multiplanar, multisequence MR imaging was performed. No intravenous contrast was administered.  COMPARISON:  CT of the head October 28, 2012 at 1946 hr.  FINDINGS: No reduced diffusion to suggest acute ischemia. No susceptibility artifact to suggest acute hemorrhage.  The ventricles and sulci are normal for patient's age. A few scattered subcentimeter supratentorial white matter T2 hyperintensities are less than expected for age, with minimal patchy pontine T2 hyperintensities. No midline shift, mass effect nor mass lesions.  No abnormal extra-axial fluid collections. Normal major intracranial vascular flow voids observed at the skull base.  Status post bilateral ocular lens implants. No abnormal sellar expansion, convex margin of the pituitary  gland noted. Craniocervical junction maintained. Partially imaged upper cervical ACDF. Minimal T2 signal within the mastoid air cells, no abnormal fullness of the nasopharynx. Paranasal sinuses appear well aerated.  IMPRESSION: No evidence of acute ischemia.  Normal noncontrast MRI of the brain for age, including mild white matter changes suggesting chronic small vessel ischemic disease, involutional changes.   Electronically Signed   By: Awilda Metro   On: 10/29/2012 00:18   US Abdomen Complete  10/28/2012   CLINICAL DATA:  Elevated bilirubin and LFTs.  EXAM: COMPLETE ABDOMINAL ULTRASOUND  COMPARISON:  CT 11/13/2009  FINDINGS: Gallbladder: Distended with layering sludge. No stones. No wall thickening. Sonographer reports no sonographic Murphy's sign.  Common bile duct: Dilated up to 10.1 mm diameter. No definite choledocholithiasis.  Liver: There is mild central intrahepatic biliary ductal dilatation suggested. The liver has a mildly inhomogeneous background echotexture without focal lesion.  IVC:  Negative  Pancreas:  Negative  Spleen:  10.4 cm craniocaudal length, unremarkable.  Right Kidney:  No mass or hydronephrosis, 12.2cm in length.  Left Kidney:  No lesion or hydronephrosis, 12.8cm in length.  Abdominal aorta:  Negative  IMPRESSION: 1. Distended gallbladder containing sludge, no additional ultrasound evidence of cholecystitis. 2. Intra and extrahepatic biliary ductal dilatation.   Electronically Signed   By: Oley Balm M.D.   On: 10/28/2012 23:13    MEDICATIONS                                                                                                                        Scheduled: . sodium chloride   Intravenous STAT  . anastrozole  1 mg Oral Daily  . ARIPiprazole  7 mg Oral QHS  . DULoxetine  60 mg Oral BID  . gabapentin  600 mg Oral TID  . insulin aspart  0-9 Units Subcutaneous TID WC  . insulin glargine  30 Units Subcutaneous BID  . lisinopril  2.5 mg Oral Daily  . loratadine  10 mg Oral Daily  . metoprolol succinate  25 mg Oral Daily  . pantoprazole  40 mg Oral Daily  . piperacillin-tazobactam (ZOSYN)  IV  3.375 g Intravenous Q8H  . sodium chloride  3 mL Intravenous Q12H    ASSESSMENT/PLAN:                                                                                                              72 YO female presenting with AMS in setting of UTI and MRI head (-).  On exam patient is slow to  respond, admits to having memory difficulties, does show increased tone throughout and resting tremor.  PD or Lewy body dementia is still in the differential. Would favor starting a low dose Sinemet (1/2) tab 25/100 TID while in the hospital and then progress to 1 tab 25/100 in one week. Patient will need a follow up with neurology as out patient soon after discharge for further follow up.   Appt has been made for 11-13-12 at 0930 ours with Dr. Everlena Cooper.   Assessment and plan discussed with with attending physician and they are in agreement.    Felicie Morn PA-C [facility] [phone]  10/29/2012, 9:58 AM    I have seen and evaluated the patient. I have reviewed the above note and made appropriate changes. MRI is negative. I agree that the patient has parkinsonism. She also endorses some visual hallucinations, I would consider lewy body dementia in the differential as well. I suspect her current worsening is due to her UTI. Will start sinemet and titrate up. Will need outpatient follow up, though no further recommendations from an inpatient perspective.   Neurology will sign off, please call with any questions.   Ritta Slot, MD [facility] [phone]  If 7pm- 7am, please page neurology on call at [phone].

## 2012-10-29 NOTE — Progress Notes (Signed)
ANTIBIOTIC CONSULT NOTE - INITIAL  Pharmacy Consult for Zosyn Indication: UTI, possible CBD obstruction   Allergies  Allergen Reactions  . Sertraline Hcl Hives  . Codeine Hives and Swelling  . Cyclobenzaprine Hcl Hives and Swelling  . Fentanyl     REACTION: PANIC ATTACKS  . Furosemide     REACTION: face was swollen and hand  . Gadolinium Derivatives Swelling  . Hydrocodone-Acetaminophen   . Iohexol      Code: SOB, Desc: xray dye, Onset Date: [id number]   . Levofloxacin     REACTION: PANIC ATTACK,DIARRHEA,EYES DISCOLORD  . Lorazepam Nausea And Vomiting  . Lubiprostone     REACTION: REALLY BAD NAUSEA  . Metformin Hives and Swelling  . Metoclopramide Nausea And Vomiting and Swelling  . Morphine     REACTION: itching, panic attacks  . Oxycodone Hives and Swelling  . Promethazine Hcl     REACTION: TRAMA ANDJITTERS IN MY BODY  . Rosiglitazone Hives and Swelling  . Sertraline Hcl Hives and Swelling  . Verapamil Hives and Nausea And Vomiting    Patient Measurements: Height: 5\' 4"  (162.6 cm) Weight: 193 lb 12.6 oz (87.9 kg) IBW/kg (Calculated) : 54.7 Vital Signs: Temp: 100 F (37.8 C) (10/29 0157) Temp src: Oral (10/29 0157) BP: 134/63 mmHg (10/29 0157) Pulse Rate: 92 (10/29 0157)  Labs:  Recent Labs  10/28/12 1938 10/28/12 1949  WBC 8.2  --   HGB 14.5 15.0  PLT 179  --   CREATININE 0.52 0.80   Estimated Creatinine Clearance: 69.2 ml/min (by C-G formula based on Cr of 0.8). No results found for this basename: VANCOTROUGH, VANCOPEAK, VANCORANDOM, GENTTROUGH, GENTPEAK, GENTRANDOM, TOBRATROUGH, TOBRAPEAK, TOBRARND, AMIKACINPEAK, AMIKACINTROU, AMIKACIN,  in the last 72 hours   Medical History: Past Medical History  Diagnosis Date  . Depression   . Diabetes mellitus   . Hypertension   . Peripheral neuropathy   . Diverticulosis   . Urinary incontinence   . Herpes   . Ovarian cyst   . Fibrous breast lumps   . Wears glasses   . Fibromyalgia   . GERD  (gastroesophageal reflux disease)   . COPD (chronic obstructive pulmonary disease)   . Asthma     bronchial  . Fatty liver     hx: of  . Anxiety   . Left carotid stenosis     Hx: of  . Breast cancer     "both sides" (10/29/2012)  . Osteoarthritis     [name] 09/12/2007  (10/29/2012)  . H/O hiatal hernia     [name] 09/12/2007  (10/29/2012)  . Migraines     [name] 09/01/2001 (10/29/2012)    Assessment: 72 y/o F CODE STROKE with elevated LFTs, abnormal UA to start Zosyn per Rx. Received Ceftriaxone x 1 in the ED at 0118. WBC wnl, Tmax 100, CrCl ~ 70.   Goal of Therapy:  Clinical resolution   Plan:  -Zosyn 3.375G IV q8h to be infused over 4 hours -Trend WBC, temp, renal function  -F/U cultures, if obtained -F/U length of treatment  Thank you for allowing me to take part in this patient's care,  Abran Duke, PharmD Clinical Pharmacist Phone: [phone] Pager: [phone] 10/29/2012 2:20 AM

## 2012-10-30 DIAGNOSIS — F0280 Dementia in other diseases classified elsewhere without behavioral disturbance: Secondary | ICD-10-CM

## 2012-10-30 DIAGNOSIS — F29 Unspecified psychosis not due to a substance or known physiological condition: Secondary | ICD-10-CM

## 2012-10-30 LAB — URINE CULTURE: Colony Count: NO GROWTH

## 2012-10-30 MED ORDER — HYDROCODONE-ACETAMINOPHEN 5-325 MG PO TABS: 2.0000 | ORAL_TABLET | Freq: Four times a day (QID) | ORAL | Status: DC | PRN

## 2012-10-30 MED ORDER — CALCIUM CARBONATE ANTACID 500 MG PO CHEW
1.0000 | CHEWABLE_TABLET | Freq: Three times a day (TID) | ORAL | Status: DC
  Filled 2012-10-30 (×2): qty 1

## 2012-10-30 MED ORDER — ALUM & MAG HYDROXIDE-SIMETH 200-200-20 MG/5ML PO SUSP
15.0000 mL | Freq: Four times a day (QID) | ORAL | Status: DC | PRN
  Administered 2012-10-30: 15 mL via ORAL
  Filled 2012-10-30: qty 30

## 2012-10-30 MED ORDER — CEPHALEXIN 500 MG PO CAPS: 500.0000 mg | ORAL_CAPSULE | Freq: Two times a day (BID) | ORAL | Status: DC

## 2012-10-30 MED ORDER — INSULIN GLARGINE 100 UNIT/ML ~~LOC~~ SOLN: 30.0000 [IU] | Freq: Two times a day (BID) | SUBCUTANEOUS | Status: DC

## 2012-10-30 MED ORDER — ALPRAZOLAM 0.5 MG PO TABS: 0.5000 mg | ORAL_TABLET | Freq: Three times a day (TID) | ORAL | Status: DC | PRN

## 2012-10-30 MED ORDER — CARBIDOPA-LEVODOPA 25-100 MG PO TABS: 0.5000 | ORAL_TABLET | Freq: Three times a day (TID) | ORAL | Status: DC

## 2012-10-30 NOTE — Discharge Summary (Addendum)
Physician Discharge Summary  Katherine Carr ZOX:[id number] DOB: [birth year] DOA: 10/28/2012  PCP: Georgann Housekeeper, MD  Admit date: 10/28/2012 Discharge date: 10/30/2012  Time spent: 35 minutes  Recommendations for Outpatient Follow-up:  start a low dose Sinemet (1/2) tab 25/100 TID while in the hospital and then progress to 1 tab 25/100 in one week. Patient will need a follow up with neurology as out patient soon after discharge for further follow up -compresses (warm) to area near vagina -keflex x 7 days -continue checking Blood sugars  -consider weaning off medications that cause sedation -CBC, CMp 1 week  Discharge Diagnoses:  Principal Problem:   Acute encephalopathy Active Problems:   DIABETES MELLITUS, TYPE II   HYPERLIPIDEMIA   Elevated LFTs   Common bile duct dilatation   Discharge Condition: improved  Diet recommendation: diabetic  Filed Weights   10/29/12 0030 10/30/12 8119  Weight: 87.9 kg (193 lb 12.6 oz) 88.724 kg (195 lb 9.6 oz)    History of present illness:  Katherine Carr is a 72 y.o. female was brought from the nursing home after patient was found to be increasingly confused with leaning towards left. Patient was brought to the ER suspecting stroke. CT head did not show anything acute and on exam patient was nonfocal but lethargic. Neurology was consulted and as per Dr. Cyril Mourning MRI brain was ordered and if negative no further stroke workup. In the ER patient's labs showed elevated LFTs and sonogram was done which showed dilated CBD. Patient otherwise denies any chest pain or shortness of breath nausea vomiting abdominal pain or diarrhea. Patient is still drowsy but answers questions appropriately.    Hospital Course:  PD or Lewy body dementia:. Would favor starting a low dose Sinemet (1/2) tab 25/100 TID while in the hospital and then progress to 1 tab 25/100 in one week. Patient will need a follow up with neurology as out patient soon after discharge  for further follow up.  Appt has been made for 11-13-12 at 0930 ours with Dr. Everlena Cooper.  -wean off sedating medications as tolerated  Elevated LFTs with possible CBD dilatation -  LFTs decreased.  No abd pain, eating well- has appointment with Dr Derrell Lolling in < 1 week  Small abscess (drained) on buttock- keflex x 7 days- will need follow up in SNF  encephalopthy due to UTI- culture sent after abx started, IV abx x 3 days  HTN-titrate as outpatient  Procedures:  none  Consultations:  neuro  Discharge Exam: Filed Vitals:   10/30/12 0605  BP: 151/75  Pulse: 84  Temp: 99 F (37.2 C)  Resp: 18    General: A+Ox3, NAD Cardiovascular: rrr Respiratory: clear anterior  Discharge Instructions   Future Appointments Provider Department Dept Phone   11/04/2012 2:30 PM [name], MD [facility], [place] [phone]   11/13/2012 9:30 AM Cira Servant, DO [facility] [phone]   02/19/2013 3:00 PM [facility]ell [facility] [phone]   02/19/2013 3:30 PM Lowella Dell, MD [facility] [phone]       Medication List    ASK your doctor about these medications       ALPRAZolam 0.5 MG tablet  Commonly known as:  XANAX  Take 0.5 mg by mouth 3 (three) times daily as needed for anxiety.     alum & mag hydroxide-simeth 200-200-20 MG/5ML suspension  Commonly known as:  MAALOX/MYLANTA  Take 30 mLs by mouth every 4 (  four) hours as needed for indigestion.     anastrozole 1 MG tablet  Commonly known as:  ARIMIDEX  Take 1 mg by mouth daily.     ARIPiprazole 5 MG tablet  Commonly known as:  ABILIFY  Take 5 mg by mouth at bedtime. *takes with 2mg  tablet for 7mg  dose     ARIPiprazole 2 MG tablet  Commonly known as:  ABILIFY  Take 2 mg by mouth daily. *takes with 5mg  tablet for 7mg  dose     atorvastatin 10 MG tablet  Commonly known as:  LIPITOR  Take 10 mg by mouth at bedtime.      cetirizine 5 MG tablet  Commonly known as:  ZYRTEC  Take 5 mg by mouth daily as needed for allergies.     diclofenac sodium 1 % Gel  Commonly known as:  VOLTAREN  Apply 4 g topically 3 (three) times daily as needed (joint pain).     DULCOLAX 10 MG suppository  Generic drug:  bisacodyl  Place 10 mg rectally daily as needed for constipation.     DULoxetine 60 MG capsule  Commonly known as:  CYMBALTA  Take 60 mg by mouth 2 (two) times daily.     fluticasone 50 MCG/ACT nasal spray  Commonly known as:  FLONASE  Place 2 sprays into the nose daily as needed for rhinitis or allergies.     gabapentin 600 MG tablet  Commonly known as:  NEURONTIN  Take 600 mg by mouth 3 (three) times daily.     HUMALOG 100 UNIT/ML injection  Generic drug:  insulin lispro  Inject 48-84 Units into the skin 3 (three) times daily before meals. 70-150= 48 units, 151-200= 54 units, 201-250= 60 units, 251-300 =66 units, 301-350= 72 units, 351-400= 78 units, over 400= 84 units.     HYDROcodone-acetaminophen 5-325 MG per tablet  Commonly known as:  NORCO/VICODIN  Take 2 tablets by mouth every 6 (six) hours as needed for pain.     hydroxypropyl methylcellulose 2.5 % ophthalmic solution  Commonly known as:  ISOPTO TEARS  Place 1 drop into both eyes 2 (two) times daily as needed.     hydrOXYzine 10 MG tablet  Commonly known as:  ATARAX/VISTARIL  Take 10 mg by mouth every 8 (eight) hours as needed for itching.     insulin glargine 100 UNIT/ML injection  Commonly known as:  LANTUS  Inject 100 Units into the skin 2 (two) times daily.     ipratropium-albuterol 0.5-2.5 (3) MG/3ML Soln  Commonly known as:  DUONEB  Take 3 mLs by nebulization every 6 (six) hours as needed (shortness of breath).     lansoprazole 30 MG capsule  Commonly known as:  PREVACID  Take 30 mg by mouth daily.     lidocaine 5 %  Commonly known as:  LIDODERM  Place 1 patch onto the skin daily. Remove & Discard patch within 12 hours  or as directed by MD     lisinopril 2.5 MG tablet  Commonly known as:  PRINIVIL,ZESTRIL  Take 2.5 mg by mouth daily.     Melatonin 3 MG Tabs  Take 3 mg by mouth at bedtime.     metoprolol succinate 25 MG 24 hr tablet  Commonly known as:  TOPROL-XL  Take 25 mg by mouth daily.     SENNA S PO  Take 1 tablet by mouth 2 (two) times daily.     temazepam 15 MG capsule  Commonly known as:  RESTORIL  Take 15 mg by mouth at bedtime as needed (for insomnia).     Vitamin D3 1000 UNITS Caps  Take 2,000 Units by mouth daily.       Allergies  Allergen Reactions  . Sertraline Hcl Hives  . Codeine Hives and Swelling  . Cyclobenzaprine Hcl Hives and Swelling  . Fentanyl     REACTION: PANIC ATTACKS  . Furosemide     REACTION: face was swollen and hand  . Gadolinium Derivatives Swelling  . Hydrocodone-Acetaminophen   . Iohexol      Code: SOB, Desc: xray dye, Onset Date: [id number]   . Levofloxacin     REACTION: PANIC ATTACK,DIARRHEA,EYES DISCOLORD  . Lorazepam Nausea And Vomiting  . Lubiprostone     REACTION: REALLY BAD NAUSEA  . Metformin Hives and Swelling  . Metoclopramide Nausea And Vomiting and Swelling  . Morphine     REACTION: itching, panic attacks  . Oxycodone Hives and Swelling  . Promethazine Hcl     REACTION: TRAMA ANDJITTERS IN MY BODY  . Rosiglitazone Hives and Swelling  . Sertraline Hcl Hives and Swelling  . Verapamil Hives and Nausea And Vomiting       Follow-up Information   Follow up with JAFFE, ADAM ROBERT, DO On 11/13/2012. (9:30 AM)    Specialty:  Neurology   Contact information:   [address] [place] [address] [phone]        The results of significant diagnostics from this hospitalization (including imaging, microbiology, ancillary and laboratory) are listed below for reference.    Significant Diagnostic Studies: Dg Chest 2 View  10/28/2012   CLINICAL DATA:  Code stroke, rule out pacemaker pre MRI.  EXAM: CHEST - 2 VIEW  COMPARISON:   04/21/2012  FINDINGS: No pacemaker. Mild cardiomegaly stable. Both lungs are clear. The visualized skeletal structures are unremarkable. No effusion. Cervical fixation hardware noted.  IMPRESSION: No acute cardiopulmonary disease.   Electronically Signed   By: Oley Balm M.D.   On: 10/28/2012 21:46   Ct Head Wo Contrast  10/28/2012   CLINICAL DATA:  Code stroke, disequilibrium.  EXAM: CT HEAD WITHOUT CONTRAST  TECHNIQUE: Contiguous axial images were obtained from the base of the skull through the vertex without intravenous contrast.  COMPARISON:  10/17/2010  FINDINGS: Atherosclerotic and physiologic intracranial calcifications. Stable small lacunar infarct in the left basal ganglia. Mild atrophy. There is no evidence of acute intracranial hemorrhage, brain edema, mass lesion, acute infarction, mass effect, or midline shift. Acute infarct may be in apparent on noncontrast CT. No other intra-axial abnormalities are seen, and the ventricles and sulci are within normal limits in size and symmetry. No abnormal extra-axial fluid collections or masses are identified. No significant calvarial abnormality. :  IMPRESSION: Negative for bleed or other acute intracranial process.  Critical Value/emergent results were called by telephone at the time of interpretation on 10/28/2012 at 7:57 PM to Dr.Camilo, who verbally acknowledged these results.   Electronically Signed   By: Oley Balm M.D.   On: 10/28/2012 19:57   Mr Brain Wo Contrast  10/29/2012   CLINICAL DATA:  Stroke, disequilibrium.  EXAM: MRI HEAD WITHOUT CONTRAST  TECHNIQUE: Multiplanar, multisequence MR imaging was performed. No intravenous contrast was administered.  COMPARISON:  CT of the head October 28, 2012 at 1946 hr.  FINDINGS: No reduced diffusion to suggest acute ischemia. No susceptibility artifact to suggest acute hemorrhage.  The ventricles and sulci are normal for patient's age. A few scattered subcentimeter supratentorial white matter  T2  hyperintensities are less than expected for age, with minimal patchy pontine T2 hyperintensities. No midline shift, mass effect nor mass lesions.  No abnormal extra-axial fluid collections. Normal major intracranial vascular flow voids observed at the skull base.  Status post bilateral ocular lens implants. No abnormal sellar expansion, convex margin of the pituitary gland noted. Craniocervical junction maintained. Partially imaged upper cervical ACDF. Minimal T2 signal within the mastoid air cells, no abnormal fullness of the nasopharynx. Paranasal sinuses appear well aerated.  IMPRESSION: No evidence of acute ischemia. Normal noncontrast MRI of the brain for age, including mild white matter changes suggesting chronic small vessel ischemic disease, involutional changes.   Electronically Signed   By: Awilda Metro   On: 10/29/2012 00:18   US Abdomen Complete  10/28/2012   CLINICAL DATA:  Elevated bilirubin and LFTs.  EXAM: COMPLETE ABDOMINAL ULTRASOUND  COMPARISON:  CT 11/13/2009  FINDINGS: Gallbladder: Distended with layering sludge. No stones. No wall thickening. Sonographer reports no sonographic Murphy's sign.  Common bile duct: Dilated up to 10.1 mm diameter. No definite choledocholithiasis.  Liver: There is mild central intrahepatic biliary ductal dilatation suggested. The liver has a mildly inhomogeneous background echotexture without focal lesion.  IVC:  Negative  Pancreas:  Negative  Spleen:  10.4 cm craniocaudal length, unremarkable.  Right Kidney:  No mass or hydronephrosis, 12.2cm in length.  Left Kidney:  No lesion or hydronephrosis, 12.8cm in length.  Abdominal aorta:  Negative  IMPRESSION: 1. Distended gallbladder containing sludge, no additional ultrasound evidence of cholecystitis. 2. Intra and extrahepatic biliary ductal dilatation.   Electronically Signed   By: Oley Balm M.D.   On: 10/28/2012 23:13    Microbiology: Recent Results (from the past 240 hour(s))  MRSA PCR SCREENING      Status: None   Collection Time    10/29/12  5:37 AM      Result Value Range Status   MRSA by PCR NEGATIVE  NEGATIVE Final   Comment:            The GeneXpert MRSA Assay (FDA     approved for NASAL specimens     only), is one component of a     comprehensive MRSA colonization     surveillance program. It is not     intended to diagnose MRSA     infection nor to guide or     monitor treatment for     MRSA infections.     Labs: Basic Metabolic Panel:  Recent Labs Lab 10/28/12 1938 10/28/12 1949 10/29/12 0534  NA 136 138 136  K 4.3 4.3 3.5  CL 98 98 101  CO2 29  --  26  GLUCOSE 281* 266* 170*  BUN 17 17 16   CREATININE 0.52 0.80 0.50  CALCIUM 9.5  --  8.7   Liver Function Tests:  Recent Labs Lab 10/28/12 1938 10/29/12 0534  AST 158* 97*  ALT 185* 142*  ALKPHOS 432* 354*  BILITOT 3.1* 1.9*  PROT 7.2 6.3  ALBUMIN 3.2* 2.8*   No results found for this basename: LIPASE, AMYLASE,  in the last 168 hours  Recent Labs Lab 10/28/12 2350  AMMONIA 33   CBC:  Recent Labs Lab 10/28/12 1938 10/28/12 1949 10/29/12 0534  WBC 8.2  --  8.0  NEUTROABS 6.3  --  5.5  HGB 14.5 15.0 12.9  HCT 40.2 44.0 35.8*  MCV 88.5  --  88.6  PLT 179  --  175   Cardiac Enzymes:  Recent Labs Lab 10/28/12 1938  TROPONINI <0.30   BNP: BNP (last 3 results) No results found for this basename: PROBNP,  in the last 8760 hours CBG:  Recent Labs Lab 10/29/12 0637 10/29/12 1100 10/29/12 1655 10/29/12 2219 10/30/12 0636  GLUCAP 171* 143* 173* 203* 135*       Signed:  Elienai Gailey  [facility] 10/30/2012, 10:04 AM

## 2012-10-30 NOTE — Plan of Care (Signed)
Report called into betha, rn at [facility].

## 2012-10-30 NOTE — Progress Notes (Signed)
Patient for d/c today to SNF bed at [facility]. Family and patient agreeable to this plan- plan transfer via EMS. Iantha Titsworth, MSW, LCSWA [phone]  

## 2012-10-30 NOTE — Progress Notes (Signed)
Clinical Social Work Department BRIEF PSYCHOSOCIAL ASSESSMENT 10/30/2012  Patient:  Katherine Carr, Katherine Carr     Account Number:  [id number]     Admit date:  10/28/2012  Clinical Social Worker:  Robin Searing  Date/Time:  10/30/2012 12:28 PM  Referred by:  Physician  Date Referred:  10/30/2012 Referred for  SNF Placement   Other Referral:   Interview type:  Patient Other interview type:    PSYCHOSOCIAL DATA Living Status:  FACILITY Admitted from facility:  [facility] Level of care:  Skilled Nursing Facility Primary support name:  sue- friend Primary support relationship to patient:  FRIEND Degree of support available:   uncertain    CURRENT CONCERNS Current Concerns  Post-Acute Placement   Other Concerns:    SOCIAL WORK ASSESSMENT / PLAN Patient admitted from [facility] SNF and is ready for d/c back today- attempting to reach family and friend at this timq   Assessment/plan status:   Other assessment/ plan:   Information/referral to community resources:    PATIENT'S/FAMILY'S RESPONSE TO PLAN OF CARE: Patient agreeable to return to SNF- will speak with SNF staff and arrange EMS transport back .       Reece Levy, MSW, Theresia Majors [phone]

## 2012-11-04 ENCOUNTER — Ambulatory Visit (INDEPENDENT_AMBULATORY_CARE_PROVIDER_SITE_OTHER): Payer: PRIVATE HEALTH INSURANCE | Admitting: General Surgery

## 2012-11-04 ENCOUNTER — Encounter (INDEPENDENT_AMBULATORY_CARE_PROVIDER_SITE_OTHER): Payer: Self-pay | Admitting: General Surgery

## 2012-11-04 VITALS — BP 132/80 | HR 76 | Temp 97.4°F | Resp 16 | Ht 64.0 in | Wt 187.0 lb

## 2012-11-04 DIAGNOSIS — D0590 Unspecified type of carcinoma in situ of unspecified breast: Secondary | ICD-10-CM

## 2012-11-04 DIAGNOSIS — D0591 Unspecified type of carcinoma in situ of right breast: Secondary | ICD-10-CM

## 2012-11-04 NOTE — Patient Instructions (Signed)
Examination of your breasts and lymph node areas today is normal. There is no evidence of cancer.  Continue to take the arimidex medication, and keep all of your regular appointments with Dr. Darnelle Catalan.  Obtain bilateral mammograms in April, 2015  Return to see Dr. Derrell Lolling in May, 2015.

## 2012-11-04 NOTE — Progress Notes (Signed)
Patient ID: Katherine Carr, female   DOB: [birth year], 72 y.o.   MRN: [id number]  History: This patient underwent right partial mastectomy with needle localization for a subareolar papilloma. The specimen contained ductal carcinoma in situ, 8 mm diameter area, receptor positive. This area was reexcised on 04/21/2012  and the subsequent specimen showed another papilloma and atypical ductal hyperplasia. She has seen Dr. Darnelle Catalan and is taking arimadex. . She has declined radiation therapy. She remains a resident of skilled nursing at [facility] and she has no complaints about her breast other than itching.     Recently hospitalized because of acute encephalopathy this resolved after 48 hours and she is back to baseline.  Exam: Patient is in a wheelchair. Alert. Moderate inside. Depressed affect. Caregiver is with her Neck no adenopathy or mass Lungs clear auscultation bilaterally Heart regular rate and rhythm. No murmur. No ectopy Breast moderately large, pendulous. Periareolar scar on right well-healed. No mass. No fluid collection. No axillary adenopathy.  Assessment:   area. Status post 2 excision shielding negative margins. Numerous medical problems, including hypertension, asthma, GERD, diabetes mellitus, acute encephalopathy, fibromyalgia, obesity, anxiety and depression   Plan: Continue arimadex Keep regular appointment with Dr. Darnelle Catalan Bilateral mammograms in April 2015 See me in May 2015.   Angelia Mould. Derrell Lolling, M.D., [facility] General and Minimally invasive Surgery Breast and Colorectal Surgery Office:   [phone] Pager:   [phone]

## 2012-11-13 ENCOUNTER — Ambulatory Visit (INDEPENDENT_AMBULATORY_CARE_PROVIDER_SITE_OTHER): Payer: PRIVATE HEALTH INSURANCE | Admitting: Neurology

## 2012-11-13 ENCOUNTER — Encounter: Payer: Self-pay | Admitting: Neurology

## 2012-11-13 VITALS — BP 144/72 | HR 66 | Temp 97.8°F

## 2012-11-13 DIAGNOSIS — G20 Parkinson's disease: Secondary | ICD-10-CM

## 2012-11-13 MED ORDER — CARBIDOPA-LEVODOPA 25-100 MG PO TABS: ORAL_TABLET | ORAL | Status: DC

## 2012-11-13 NOTE — Patient Instructions (Signed)
1. Start carbidopa (Sinemet) 25/100mg  tablets.  Day 1-3: Take 0.5 tablet in morning and 0.5 tablet in evening/bedtime.  Day 4-7: Take 0.5 tablet in morning, 0.5 tablet in afternoon, and 0.5 tablet in evening/bedtime.  Week 2 & thereafter: Take 1 tablet three times daily.   Take the medication at the same time everyday. The medication does not get absorbed into your body as well, if you take it with protein-containing foods (meat, dairy, beans). Try taking the medication about one hour before meals. If you experience nausea by taking it on an empty stomach, you may take it with carbohydrate-containing food,such as bread.  Side effects to look out for, include dizziness, nausea, vivid dreams and hallucinations. If you experience any of these symptoms, please call us.  2.  I don't want to start two medications at the same time.  She also has evidence of cognitive impairment so next time, I would likely start something for that, such as Aricept.  3.  Follow up in 3 months.

## 2012-11-13 NOTE — Progress Notes (Signed)
NEUROLOGY CONSULTATION NOTE  Katherine Carr MRN: [id number] DOB: 09/14/1940  Referring provider: Hospital referral Primary care provider: Dr. Donette Larry  Reason for consult:  Parkinson's disease.  HISTORY OF PRESENT ILLNESS: Katherine Carr is a 72 year old right-handed woman from nursing home with history of depression, diabetes, hyperlipidemia and hypertension who presents for Parkinson's disease.  Records and images were personally reviewed where available.   She was admitted to the hospital from 10/28/12 to 10/30/12 for stroke like symptoms, presenting as confusing and leaning towards the left.  CT head revealed no acute abnormalities.  MRI of brain was unremarkable.  She was found to have a UTI.  LFTs were initially elevated and thought to be secondary to CBD.  They later normalized.  Neurological exam revealed cogwheel rigidity and pill-rolling resting tremor in left hand.  She also endorsed memory problems and visual hallucinations.  She was started on Sinemet and currently takes 25/100mg  TID but was discontinued after discharge until she can be evaluated in clinic.  She has been wheelchair-bound for two years, she says due to diabetic neuropathy.  She fell multiple times.  She also has history of cervical stenosis as well as "lumbar problems".  She notes occasional tremor in her left hand and difficulty sometimes using the hand.  She notes feeling of lightheadedness and spinning if she sits up from bed.  She denies difficulty swallowing.  She has right arm pain stemming from her shoulder.  No numbness in the right arm.  She does report history of vivid dreams but denies anyone telling her she has symptoms consistent with REM sleep behavior disorder.  She denies memory problems.  She notes that her father had tremors.  PAST MEDICAL HISTORY: Past Medical History  Diagnosis Date  . Depression   . Diabetes mellitus   . Hypertension   . Peripheral neuropathy   . Diverticulosis   .  Urinary incontinence   . Herpes   . Ovarian cyst   . Fibrous breast lumps   . Wears glasses   . Fibromyalgia   . GERD (gastroesophageal reflux disease)   . COPD (chronic obstructive pulmonary disease)   . Asthma     bronchial  . Fatty liver     hx: of  . Anxiety   . Left carotid stenosis     Hx: of  . Breast cancer     "both sides" (10/29/2012)  . Osteoarthritis     [name] 09/12/2007  (10/29/2012)  . H/O hiatal hernia     [name] 09/12/2007  (10/29/2012)  . Migraines     [name] 09/01/2001 (10/29/2012)    PAST SURGICAL HISTORY: Past Surgical History  Procedure Laterality Date  . Carpal tunnel release Bilateral   . Cesarean section    . Abdominal hysterectomy    . Myomectomy    . Breast lumpectomy with needle localization Right 04/02/2012    Procedure: Excision Ductal System Right Breast with Needle Localization;  Surgeon: [name], MD;  Location: [facility];  Service: General;  Laterality: Right;  needle localization at 7:30 at [facility]   . Nevus excision Left 04/02/2012    Procedure: Excision Nevus Left Abdominal Wall;  Surgeon: [name], MD;  Location: [facility];  Service: General;  Laterality: Left;  . Breast lumpectomy Right 04/21/2012    Procedure: Right breast LUMPECTOMY with re-excision of margins;  Surgeon: [name], MD;  Location: [facility];  Service: General;  Laterality: Right;  . Tonsillectomy    .  Appendectomy    . Breast ductal system excision Left     [name] 04/02/2012  (10/29/2012)  . Back surgery      central decompression L5-S1/notes 10/23/2005 (10/29/2012)  . Anterior cervical decomp/discectomy fusion  09/2003    [name] 09/25/2003 (10/29/2012)    MEDICATIONS: Current Outpatient Prescriptions on File Prior to Visit  Medication Sig Dispense Refill  . ALPRAZolam (XANAX) 0.5 MG tablet Take 1 tablet (0.5 mg total) by mouth 3 (three) times daily as needed for anxiety.  5 tablet  0  . alum & mag hydroxide-simeth  (MAALOX/MYLANTA) 200-200-20 MG/5ML suspension Take 30 mLs by mouth every 4 (four) hours as needed for indigestion.      Marland Kitchen anastrozole (ARIMIDEX) 1 MG tablet Take 1 mg by mouth daily.      . ARIPiprazole (ABILIFY) 2 MG tablet Take 2 mg by mouth daily. *takes with 5mg  tablet for 7mg  dose      . ARIPiprazole (ABILIFY) 5 MG tablet Take 5 mg by mouth at bedtime. *takes with 2mg  tablet for 7mg  dose      . atorvastatin (LIPITOR) 10 MG tablet Take 10 mg by mouth at bedtime.      . bisacodyl (DULCOLAX) 10 MG suppository Place 10 mg rectally daily as needed for constipation.       . cephALEXin (KEFLEX) 500 MG capsule Take 1 capsule (500 mg total) by mouth 2 (two) times daily.  14 capsule  0  . cetirizine (ZYRTEC) 5 MG tablet Take 5 mg by mouth daily as needed for allergies.       . Cholecalciferol (VITAMIN D3) 1000 UNITS CAPS Take 2,000 Units by mouth daily.      . clotrimazole-betamethasone (LOTRISONE) cream       . diclofenac sodium (VOLTAREN) 1 % GEL Apply 4 g topically 3 (three) times daily as needed (joint pain).      . DULoxetine (CYMBALTA) 60 MG capsule Take 60 mg by mouth 2 (two) times daily.      . fluticasone (FLONASE) 50 MCG/ACT nasal spray Place 2 sprays into the nose daily as needed for rhinitis or allergies.      Marland Kitchen gabapentin (NEURONTIN) 600 MG tablet Take 600 mg by mouth 3 (three) times daily.        Marland Kitchen HUMALOG 100 UNIT/ML injection Inject 48-84 Units into the skin 3 (three) times daily before meals. 70-150= 48 units, 151-200= 54 units, 201-250= 60 units, 251-300 =66 units, 301-350= 72 units, 351-400= 78 units, over 400= 84 units.      Marland Kitchen HYDROcodone-acetaminophen (NORCO/VICODIN) 5-325 MG per tablet Take 2 tablets by mouth every 6 (six) hours as needed for pain.  10 tablet  0  . hydroxypropyl methylcellulose (ISOPTO TEARS) 2.5 % ophthalmic solution Place 1 drop into both eyes 2 (two) times daily as needed.       . hydrOXYzine (ATARAX/VISTARIL) 10 MG tablet Take 10 mg by mouth every 8 (eight)  hours as needed for itching.      . insulin glargine (LANTUS) 100 UNIT/ML injection Inject 0.3 mLs (30 Units total) into the skin 2 (two) times daily.  10 mL  12  . ipratropium-albuterol (DUONEB) 0.5-2.5 (3) MG/3ML SOLN Take 3 mLs by nebulization every 6 (six) hours as needed (shortness of breath).      . lansoprazole (PREVACID) 30 MG capsule Take 30 mg by mouth daily.      Marland Kitchen lidocaine (LIDODERM) 5 % Place 1 patch onto the skin daily. Remove & Discard patch within 12 hours  or as directed by MD      . lisinopril (PRINIVIL,ZESTRIL) 2.5 MG tablet Take 2.5 mg by mouth daily.       . Melatonin 3 MG TABS Take 3 mg by mouth at bedtime.      . metoprolol succinate (TOPROL-XL) 25 MG 24 hr tablet Take 25 mg by mouth daily.        Bernadette Hoit Sodium (SENNA S PO) Take 1 tablet by mouth 2 (two) times daily.       . temazepam (RESTORIL) 15 MG capsule Take 15 mg by mouth at bedtime as needed (for insomnia).       . triamcinolone cream (KENALOG) 0.1 %       . [DISCONTINUED] ClonazePAM (KLONOPIN PO) Take 0.2 mg by mouth 2 (two) times daily.        . [DISCONTINUED] escitalopram (LEXAPRO) 20 MG tablet Take 10 mg by mouth. Take 3 tablets in the AM      . [DISCONTINUED] esomeprazole (NEXIUM) 40 MG capsule Take 40 mg by mouth daily before breakfast.        . [DISCONTINUED] Insulin Regular Human (HUMULIN R U-500, CONCENTRATED, [place]) Inject into the skin 3 (three) times daily. Take three times / day prn, per SS Insulin coverage       No current facility-administered medications on file prior to visit.    ALLERGIES: Allergies  Allergen Reactions  . Sertraline Hcl Hives  . Codeine Hives and Swelling  . Cyclobenzaprine Hcl Hives and Swelling  . Fentanyl     REACTION: PANIC ATTACKS  . Furosemide     REACTION: face was swollen and hand  . Gadolinium Derivatives Swelling  . Hydrocodone-Acetaminophen   . Iohexol      Code: SOB, Desc: xray dye, Onset Date: [id number]   . Levofloxacin     REACTION: PANIC  ATTACK,DIARRHEA,EYES DISCOLORD  . Lorazepam Nausea And Vomiting  . Lubiprostone     REACTION: REALLY BAD NAUSEA  . Metformin Hives and Swelling  . Metoclopramide Nausea And Vomiting and Swelling  . Morphine     REACTION: itching, panic attacks  . Oxycodone Hives and Swelling  . Promethazine Hcl     REACTION: TRAMA ANDJITTERS IN MY BODY  . Rosiglitazone Hives and Swelling  . Sertraline Hcl Hives and Swelling  . Verapamil Hives and Nausea And Vomiting    FAMILY HISTORY: Family History  Problem Relation Age of Onset  . Heart disease Mother   . Heart disease Brother   . Diabetes Brother   . Arthritis Sister   . Stroke Sister   . Cirrhosis Brother   . Cancer Brother     Bladder cancer    SOCIAL HISTORY: History   Social History  . Marital Status: Divorced    Spouse Name: N/A    Number of Children: N/A  . Years of Education: N/A   Occupational History  . Not on file.   Social History Main Topics  . Smoking status: Former Smoker -- 20 years    Types: Cigarettes    Quit date: 08/15/1996  . Smokeless tobacco: Never Used     Comment: smoked 2-3 cigarettes/day  . Alcohol Use: No  . Drug Use: No  . Sexual Activity: No   Other Topics Concern  . Not on file   Social History Narrative  . No narrative on file    REVIEW OF SYSTEMS: Constitutional: No fevers, chills, or sweats, no generalized fatigue, change in appetite Eyes: No visual changes, double vision,  eye pain Ear, nose and throat: No hearing loss, ear pain, nasal congestion, sore throat Cardiovascular: No chest pain, palpitations Respiratory:  No shortness of breath at rest or with exertion, wheezes GastrointestinaI: No nausea, vomiting, diarrhea, abdominal pain, fecal incontinence Genitourinary:  No dysuria, urinary retention or frequency Musculoskeletal:  No neck pain, back pain Integumentary: No rash, pruritus, skin lesions Neurological: as above Psychiatric: No depression, insomnia, anxiety Endocrine:  No palpitations, fatigue, diaphoresis, mood swings, change in appetite, change in weight, increased thirst Hematologic/Lymphatic:  No anemia, purpura, petechiae. Allergic/Immunologic: no itchy/runny eyes, nasal congestion, recent allergic reactions, rashes  PHYSICAL EXAM: Filed Vitals:   11/13/12 0923  BP: 144/72  Pulse: 66  Temp: 97.8 F (36.6 C)   General: No acute distress Head:  Normocephalic/atraumatic Neck: supple, no paraspinal tenderness, full range of motion Back: No paraspinal tenderness Heart: regular rate and rhythm Lungs: Clear to auscultation bilaterally. Vascular: No carotid bruits. Neurological Exam: Mental status: alert and oriented to person, place, and time, difficulty with visual-spatial and executive functioning (Trail Making test, copying cube, placing hands correctly to requested time on clock). speech slowed but fluent and not dysarthric, able to name, and follow commands.  Mild difficulty in repeating.  Unable to recall 5 of 5 words after several minutes.  Minor difficulty in attention.  Unable to perform serial 7 subtraction.  Poor naming fluency.  MOCA 12/30.  Bradyphrenic  Cranial nerves: CN I: not tested CN II: pupils equal, round and reactive to light, visual fields intact, fundi unremarkable. CN III, IV, VI:  full range of motion, no nystagmus, no ptosis CN V: facial sensation intact CN VII: upper and lower face symmetric, hypomimia CN VIII: hearing intact CN IX, X: gag intact, uvula midline CN XI: sternocleidomastoid and trapezius muscles intact CN XII: tongue midline Bulk & Tone: normal, no fasciculations. Motor: 5-/5 UEs and symmetric, 2/5 LEs, left foot drop, lead pipe rigidity in elbows, cogwheel rigidity in wrists, bradykinetic, reduced finger-thumb tapping speed and amplitude, left pill-rolling tremor Sensation: Endorses reduced vibration in feet, endorses reduced pinprick sensation over dorsum of left foot. Deep Tendon Reflexes: 1+ throughout  except absent in ankles, toes down Finger to nose testing: bradykinetic Gait: Wheelchair-bound. Romberg wheelchair-bound.  IMPRESSION: 1. Parkinson's disease with mild cognitive impairment. 2. Has clinical findings of left L5 radiculopathy, likely chronic and related to history of lumbar problems.  Not painful and patient non-ambulatory so nothing to do.  PLAN: 1.  Will restart Sinemet and titrate to 25/100mg  TID 2.  Follow up in 3 months.  If tolerating Sinemet, consider starting a cholinesterase inhibitor for cognitive impairment.  Thank you for allowing me to take part in the care of this patient.  Shon Millet, DO  CC:  Georgann Housekeeper, MD

## 2012-12-31 ENCOUNTER — Telehealth: Payer: Self-pay | Admitting: *Deleted

## 2012-12-31 NOTE — Telephone Encounter (Signed)
sw pt informed her that [facility] will be on pal 02/19/13. gv appt for 02/04/13 w/ labs@ 3:30pm and ov@ 4pm. Made pt aware that i will mail a letter/avs as well...td

## 2013-02-04 ENCOUNTER — Other Ambulatory Visit: Payer: Self-pay | Admitting: Oncology

## 2013-02-04 ENCOUNTER — Encounter (HOSPITAL_COMMUNITY): Payer: Self-pay | Admitting: Emergency Medicine

## 2013-02-04 ENCOUNTER — Ambulatory Visit: Payer: Self-pay | Admitting: Oncology

## 2013-02-04 ENCOUNTER — Other Ambulatory Visit: Payer: Self-pay

## 2013-02-04 ENCOUNTER — Emergency Department (HOSPITAL_COMMUNITY): Payer: PRIVATE HEALTH INSURANCE

## 2013-02-04 ENCOUNTER — Inpatient Hospital Stay (HOSPITAL_COMMUNITY)
Admission: EM | Admit: 2013-02-04 | Discharge: 2013-02-06 | DRG: 689 | Disposition: A | Discharge status: Skilled Nursing Facility | Payer: PRIVATE HEALTH INSURANCE | Attending: Internal Medicine | Admitting: Internal Medicine

## 2013-02-04 DIAGNOSIS — Z794 Long term (current) use of insulin: Secondary | ICD-10-CM

## 2013-02-04 DIAGNOSIS — IMO0001 Reserved for inherently not codable concepts without codable children: Secondary | ICD-10-CM | POA: Diagnosis present

## 2013-02-04 DIAGNOSIS — R945 Abnormal results of liver function studies: Secondary | ICD-10-CM

## 2013-02-04 DIAGNOSIS — G9340 Encephalopathy, unspecified: Secondary | ICD-10-CM | POA: Diagnosis present

## 2013-02-04 DIAGNOSIS — K838 Other specified diseases of biliary tract: Secondary | ICD-10-CM

## 2013-02-04 DIAGNOSIS — R509 Fever, unspecified: Secondary | ICD-10-CM | POA: Diagnosis present

## 2013-02-04 DIAGNOSIS — Z79899 Other long term (current) drug therapy: Secondary | ICD-10-CM

## 2013-02-04 DIAGNOSIS — F329 Major depressive disorder, single episode, unspecified: Secondary | ICD-10-CM | POA: Diagnosis present

## 2013-02-04 DIAGNOSIS — E11649 Type 2 diabetes mellitus with hypoglycemia without coma: Secondary | ICD-10-CM | POA: Diagnosis present

## 2013-02-04 DIAGNOSIS — Z853 Personal history of malignant neoplasm of breast: Secondary | ICD-10-CM

## 2013-02-04 DIAGNOSIS — K5900 Constipation, unspecified: Secondary | ICD-10-CM | POA: Diagnosis present

## 2013-02-04 DIAGNOSIS — K219 Gastro-esophageal reflux disease without esophagitis: Secondary | ICD-10-CM | POA: Diagnosis present

## 2013-02-04 DIAGNOSIS — J069 Acute upper respiratory infection, unspecified: Secondary | ICD-10-CM

## 2013-02-04 DIAGNOSIS — Z862 Personal history of diseases of the blood and blood-forming organs and certain disorders involving the immune mechanism: Secondary | ICD-10-CM | POA: Diagnosis present

## 2013-02-04 DIAGNOSIS — F411 Generalized anxiety disorder: Secondary | ICD-10-CM | POA: Diagnosis present

## 2013-02-04 DIAGNOSIS — E1165 Type 2 diabetes mellitus with hyperglycemia: Secondary | ICD-10-CM

## 2013-02-04 DIAGNOSIS — C50919 Malignant neoplasm of unspecified site of unspecified female breast: Secondary | ICD-10-CM

## 2013-02-04 DIAGNOSIS — J209 Acute bronchitis, unspecified: Secondary | ICD-10-CM

## 2013-02-04 DIAGNOSIS — G609 Hereditary and idiopathic neuropathy, unspecified: Secondary | ICD-10-CM | POA: Diagnosis present

## 2013-02-04 DIAGNOSIS — Z87891 Personal history of nicotine dependence: Secondary | ICD-10-CM

## 2013-02-04 DIAGNOSIS — F3289 Other specified depressive episodes: Secondary | ICD-10-CM | POA: Diagnosis present

## 2013-02-04 DIAGNOSIS — Z8639 Personal history of other endocrine, nutritional and metabolic disease: Secondary | ICD-10-CM

## 2013-02-04 DIAGNOSIS — I10 Essential (primary) hypertension: Secondary | ICD-10-CM | POA: Diagnosis present

## 2013-02-04 DIAGNOSIS — G20A1 Parkinson's disease without dyskinesia, without mention of fluctuations: Secondary | ICD-10-CM | POA: Diagnosis present

## 2013-02-04 DIAGNOSIS — R7989 Other specified abnormal findings of blood chemistry: Secondary | ICD-10-CM

## 2013-02-04 DIAGNOSIS — J449 Chronic obstructive pulmonary disease, unspecified: Secondary | ICD-10-CM | POA: Diagnosis present

## 2013-02-04 DIAGNOSIS — Z981 Arthrodesis status: Secondary | ICD-10-CM

## 2013-02-04 DIAGNOSIS — G20 Parkinson's disease: Secondary | ICD-10-CM

## 2013-02-04 DIAGNOSIS — G629 Polyneuropathy, unspecified: Secondary | ICD-10-CM | POA: Diagnosis present

## 2013-02-04 DIAGNOSIS — J4489 Other specified chronic obstructive pulmonary disease: Secondary | ICD-10-CM | POA: Diagnosis present

## 2013-02-04 DIAGNOSIS — N390 Urinary tract infection, site not specified: Principal | ICD-10-CM | POA: Diagnosis present

## 2013-02-04 DIAGNOSIS — G9341 Metabolic encephalopathy: Secondary | ICD-10-CM | POA: Diagnosis present

## 2013-02-04 LAB — COMPREHENSIVE METABOLIC PANEL
ALK PHOS: 157 U/L — AB (ref 39–117)
ALT: 20 U/L (ref 0–35)
AST: 45 U/L — ABNORMAL HIGH (ref 0–37)
Albumin: 2.6 g/dL — ABNORMAL LOW (ref 3.5–5.2)
BILIRUBIN TOTAL: 0.5 mg/dL (ref 0.3–1.2)
BUN: 35 mg/dL — ABNORMAL HIGH (ref 6–23)
CO2: 25 meq/L (ref 19–32)
Calcium: 8.7 mg/dL (ref 8.4–10.5)
Chloride: 100 mEq/L (ref 96–112)
Creatinine, Ser: 0.65 mg/dL (ref 0.50–1.10)
GFR, EST NON AFRICAN AMERICAN: 87 mL/min — AB (ref >90)
GLUCOSE: 178 mg/dL — AB (ref 70–99)
POTASSIUM: 3.6 meq/L — AB (ref 3.7–5.3)
SODIUM: 140 meq/L (ref 137–147)
Total Protein: 6.3 g/dL (ref 6.0–8.3)

## 2013-02-04 LAB — CBC WITH DIFFERENTIAL/PLATELET
Basophils Absolute: 0 10*3/uL (ref 0.0–0.1)
Basophils Relative: 0 % (ref 0–1)
EOS PCT: 1 % (ref 0–5)
Eosinophils Absolute: 0 10*3/uL (ref 0.0–0.7)
HCT: 37.6 % (ref 36.0–46.0)
Hemoglobin: 13.5 g/dL (ref 12.0–15.0)
LYMPHS ABS: 0.9 10*3/uL (ref 0.7–4.0)
LYMPHS PCT: 16 % (ref 12–46)
MCH: 32.1 pg (ref 26.0–34.0)
MCHC: 35.9 g/dL (ref 30.0–36.0)
MCV: 89.5 fL (ref 78.0–100.0)
Monocytes Absolute: 0.6 10*3/uL (ref 0.1–1.0)
Monocytes Relative: 9 % (ref 3–12)
NEUTROS PCT: 74 % (ref 43–77)
Neutro Abs: 4.5 10*3/uL (ref 1.7–7.7)
Platelets: 158 10*3/uL (ref 150–400)
RBC: 4.2 MIL/uL (ref 3.87–5.11)
RDW: 14.8 % (ref 11.5–15.5)
WBC: 6.1 10*3/uL (ref 4.0–10.5)

## 2013-02-04 LAB — URINALYSIS, ROUTINE W REFLEX MICROSCOPIC
Glucose, UA: 250 mg/dL — AB
Ketones, ur: 15 mg/dL — AB
NITRITE: NEGATIVE
Protein, ur: >300 mg/dL — AB
Specific Gravity, Urine: 1.033 — ABNORMAL HIGH (ref 1.005–1.030)
UROBILINOGEN UA: 1 mg/dL (ref 0.0–1.0)
pH: 5 (ref 5.0–8.0)

## 2013-02-04 LAB — URINE MICROSCOPIC-ADD ON

## 2013-02-04 LAB — CG4 I-STAT (LACTIC ACID): Lactic Acid, Venous: 2.11 mmol/L (ref 0.5–2.2)

## 2013-02-04 LAB — AMMONIA: Ammonia: 34 umol/L (ref 11–60)

## 2013-02-04 LAB — PROCALCITONIN: PROCALCITONIN: 0.58 ng/mL

## 2013-02-04 MED ORDER — SODIUM CHLORIDE 0.9 % IV SOLN: 1000.0000 mL | Freq: Once | INTRAVENOUS | Status: DC

## 2013-02-04 MED ORDER — SODIUM CHLORIDE 0.9 % IV SOLN: 1000.0000 mL | INTRAVENOUS | Status: DC

## 2013-02-04 NOTE — ED Notes (Signed)
Coming from [facility] facility. Recently started treatment for UTI and is on Rocephin currently. Sent here by nursing facility for AMS.

## 2013-02-04 NOTE — ED Provider Notes (Signed)
CSN: [id number]     Arrival date & time 02/04/13  2145 History   First MD Initiated Contact with Patient 02/04/13 2226     Chief Complaint  Patient presents with  . Altered Mental Status   level V caveat due to altered mental status. (Consider location/radiation/quality/duration/timing/severity/associated sxs/prior Treatment) Patient is a 73 y.o. female presenting with altered mental status. The history is provided by the patient.  Altered Mental Status Associated symptoms: abdominal pain    patient was brought in by husband for some confusion. Has reportedly been treated with Rocephin for UTI. Has a fever here. Patient states she feels bad in her upper abdomen hurts. She has had some mild confusion. Husband is in the room, however he continues to talk on his phone.  Past Medical History  Diagnosis Date  . Depression   . Diabetes mellitus   . Hypertension   . Peripheral neuropathy   . Diverticulosis   . Urinary incontinence   . Herpes   . Ovarian cyst   . Fibrous breast lumps   . Wears glasses   . Fibromyalgia   . GERD (gastroesophageal reflux disease)   . COPD (chronic obstructive pulmonary disease)   . Asthma     bronchial  . Fatty liver     hx: of  . Anxiety   . Left carotid stenosis     Hx: of  . Breast cancer     "both sides" (10/29/2012)  . Osteoarthritis     Archie Endo 09/12/2007  (10/29/2012)  . H/O hiatal hernia     Archie Endo 09/12/2007  (10/29/2012)  . Migraines     Archie Endo 09/01/2001 (10/29/2012)   Past Surgical History  Procedure Laterality Date  . Carpal tunnel release Bilateral   . Cesarean section    . Abdominal hysterectomy    . Myomectomy    . Breast lumpectomy with needle localization Right 04/02/2012    Procedure: Excision Ductal System Right Breast with Needle Localization;  Surgeon: Adin Hector, MD;  Location: [facility];  Service: General;  Laterality: Right;  needle localization at 7:30 at [facility]   . Nevus excision Left 04/02/2012   Procedure: Excision Nevus Left Abdominal Wall;  Surgeon: Adin Hector, MD;  Location: [facility] [facility];  Service: General;  Laterality: Left;  . Breast lumpectomy Right 04/21/2012    Procedure: Right breast LUMPECTOMY with re-excision of margins;  Surgeon: Adin Hector, MD;  Location: [facility];  Service: General;  Laterality: Right;  . Tonsillectomy    . Appendectomy    . Breast ductal system excision Left     Archie Endo 04/02/2012  (10/29/2012)  . Back surgery      central decompression L5-S1/notes 10/23/2005 (10/29/2012)  . Anterior cervical decomp/discectomy fusion  09/2003    Archie Endo 09/25/2003 (10/29/2012)   Family History  Problem Relation Age of Onset  . Heart disease Mother   . Heart disease Brother   . Diabetes Brother   . Arthritis Sister   . Stroke Sister   . Cirrhosis Brother   . Cancer Brother     Bladder cancer   History  Substance Use Topics  . Smoking status: Former Smoker -- 20 years    Types: Cigarettes    Quit date: 08/15/1996  . Smokeless tobacco: Never Used     Comment: smoked 2-3 cigarettes/day  . Alcohol Use: No   OB History   Grav Para Term Preterm Abortions TAB SAB Ect Mult Living   1 1  1     Review of Systems  Unable to perform ROS Gastrointestinal: Positive for abdominal pain.    Allergies  Sertraline hcl; Codeine; Cyclobenzaprine hcl; Fentanyl; Furosemide; Gadolinium derivatives; Hydrocodone-acetaminophen; Iohexol; Levofloxacin; Lorazepam; Lubiprostone; Metformin; Metoclopramide; Morphine; Oxycodone; Promethazine hcl; Rosiglitazone; Sertraline hcl; and Verapamil  Home Medications   Current Outpatient Rx  Name  Route  Sig  Dispense  Refill  . ALPRAZolam (XANAX) 0.5 MG tablet   Oral   Take 1 tablet (0.5 mg total) by mouth 3 (three) times daily as needed for anxiety.   5 tablet   0   . alum & mag hydroxide-simeth (MAALOX/MYLANTA) 200-200-20 MG/5ML suspension   Oral   Take 30 mLs by mouth every 4 (four) hours as needed for  indigestion.         Marland Kitchen anastrozole (ARIMIDEX) 1 MG tablet   Oral   Take 1 mg by mouth daily.         . ARIPiprazole (ABILIFY) 2 MG tablet   Oral   Take 2 mg by mouth daily. *takes with 5mg  tablet for 7mg  dose         . ARIPiprazole (ABILIFY) 5 MG tablet   Oral   Take 5 mg by mouth at bedtime. *takes with 2mg  tablet for 7mg  dose         . atorvastatin (LIPITOR) 10 MG tablet   Oral   Take 10 mg by mouth at bedtime.         . bisacodyl (DULCOLAX) 10 MG suppository   Rectal   Place 10 mg rectally daily as needed for constipation.          . carbidopa-levodopa (SINEMET) 25-100 MG per tablet      Take 0.5tab BID x3d, then 0.5tab TID x4d, then 1tab TID   90 tablet   0   . cephALEXin (KEFLEX) 500 MG capsule   Oral   Take 1 capsule (500 mg total) by mouth 2 (two) times daily.   14 capsule   0   . cetirizine (ZYRTEC) 5 MG tablet   Oral   Take 5 mg by mouth daily as needed for allergies.          . Cholecalciferol (VITAMIN D3) 1000 UNITS CAPS   Oral   Take 2,000 Units by mouth daily.         . clotrimazole-betamethasone (LOTRISONE) cream               . diclofenac sodium (VOLTAREN) 1 % GEL   Topical   Apply 4 g topically 3 (three) times daily as needed (joint pain).         . DULoxetine (CYMBALTA) 60 MG capsule   Oral   Take 60 mg by mouth 2 (two) times daily.         . fluticasone (FLONASE) 50 MCG/ACT nasal spray   Nasal   Place 2 sprays into the nose daily as needed for rhinitis or allergies.         Marland Kitchen gabapentin (NEURONTIN) 600 MG tablet   Oral   Take 600 mg by mouth 3 (three) times daily.           Marland Kitchen HUMALOG 100 UNIT/ML injection   Subcutaneous   Inject 48-84 Units into the skin 3 (three) times daily before meals. 70-150= 48 units, 151-200= 54 units, 201-250= 60 units, 251-300 =66 units, 301-350= 72 units, 351-400= 78 units, over 400= 84 units.         Marland Kitchen HYDROcodone-acetaminophen (NORCO/VICODIN)  5-325 MG per tablet   Oral   Take  2 tablets by mouth every 6 (six) hours as needed for pain.   10 tablet   0   . hydroxypropyl methylcellulose (ISOPTO TEARS) 2.5 % ophthalmic solution   Both Eyes   Place 1 drop into both eyes 2 (two) times daily as needed.          . hydrOXYzine (ATARAX/VISTARIL) 10 MG tablet   Oral   Take 10 mg by mouth every 8 (eight) hours as needed for itching.         . insulin glargine (LANTUS) 100 UNIT/ML injection   Subcutaneous   Inject 0.3 mLs (30 Units total) into the skin 2 (two) times daily.   10 mL   12   . ipratropium-albuterol (DUONEB) 0.5-2.5 (3) MG/3ML SOLN   Nebulization   Take 3 mLs by nebulization every 6 (six) hours as needed (shortness of breath).         . lansoprazole (PREVACID) 30 MG capsule   Oral   Take 30 mg by mouth daily.         Marland Kitchen lidocaine (LIDODERM) 5 %   Transdermal   Place 1 patch onto the skin daily. Remove & Discard patch within 12 hours or as directed by MD         . lisinopril (PRINIVIL,ZESTRIL) 2.5 MG tablet   Oral   Take 2.5 mg by mouth daily.          . Melatonin 3 MG TABS   Oral   Take 3 mg by mouth at bedtime.         . metoprolol succinate (TOPROL-XL) 25 MG 24 hr tablet   Oral   Take 25 mg by mouth daily.           Orlie Dakin Sodium (SENNA S PO)   Oral   Take 1 tablet by mouth 2 (two) times daily.          . temazepam (RESTORIL) 15 MG capsule   Oral   Take 15 mg by mouth at bedtime as needed (for insomnia).          . triamcinolone cream (KENALOG) 0.1 %                BP 134/64  Pulse 102  Temp(Src) 101 F (38.3 C) (Rectal)  Resp 20  SpO2 97% Physical Exam  Constitutional: She appears well-developed.  HENT:  Head: Normocephalic.  Cardiovascular:  Mild tachycardia  Pulmonary/Chest: Effort normal and breath sounds normal.  Abdominal: Soft. There is no tenderness.  Musculoskeletal: She exhibits edema.  Bilateral lower extremity pitting edema.  Neurological: She is alert.  Patient will  grip hands equally bilaterally. Mild confusion  Skin: Skin is warm.    ED Course  Procedures (including critical care time) Labs Review Labs Reviewed  COMPREHENSIVE METABOLIC PANEL - Abnormal; Notable for the following:    Potassium 3.6 (*)    Glucose, Bld 178 (*)    BUN 35 (*)    Albumin 2.6 (*)    AST 45 (*)    Alkaline Phosphatase 157 (*)    GFR calc non Af Amer 87 (*)    All other components within normal limits  URINALYSIS, ROUTINE W REFLEX MICROSCOPIC - Abnormal; Notable for the following:    Color, Urine AMBER (*)    APPearance CLOUDY (*)    Specific Gravity, Urine 1.033 (*)    Glucose, UA 250 (*)    Hgb urine  dipstick SMALL (*)    Bilirubin Urine SMALL (*)    Ketones, ur 15 (*)    Protein, ur >300 (*)    Leukocytes, UA TRACE (*)    All other components within normal limits  URINE MICROSCOPIC-ADD ON - Abnormal; Notable for the following:    Bacteria, UA FEW (*)    Casts GRANULAR CAST (*)    Crystals CA OXALATE CRYSTALS (*)    All other components within normal limits  CULTURE, BLOOD (ROUTINE X 2)  CULTURE, BLOOD (ROUTINE X 2)  URINE CULTURE  CBC WITH DIFFERENTIAL  PROCALCITONIN  AMMONIA  INFLUENZA PANEL BY PCR (TYPE A & B, H1N1)  CG4 I-STAT (LACTIC ACID)   Imaging Review Dg Chest Port 1 View  02/04/2013   CLINICAL DATA:  Fever and altered mental status.  EXAM: PORTABLE CHEST - 1 VIEW  COMPARISON:  PA and lateral chest 10/28/2012.  FINDINGS: The patient is slightly rotated on the study. Lungs are clear. Heart size is upper normal. No pneumothorax or pleural effusion.  IMPRESSION: No acute disease.   Electronically Signed   By: Inge Rise M.D.   On: 02/04/2013 23:16    EKG Interpretation   None       MDM   1. UTI (urinary tract infection)   2. Encephalopathy    Patient with some confusion. Mild fever. Mild tachycardia. Improved with IV fluids. Urinalysis does not show clear UTI, however she started on antibiotics for urinary tract infection. Some  mild encephalopathy. Will admit to internal medicine.    Jasper Riling. Alvino Chapel, MD 02/05/13 [phone]

## 2013-02-04 NOTE — ED Notes (Signed)
Discussed plan of care for pt w/ Dr. Alvino Chapel, pt not to receive IV bolus at this time - will continue to monitor.

## 2013-02-04 NOTE — ED Notes (Addendum)
Pt presents from [facility] w/ c/o AMS - per staff and pt's husband at bedside pt recently being treated for UTI w/ IM rocephin - today pt noted to have increased lethargy - per pt's husband pt is normally alert, oriented and active. Upon assessment pt is able to arouse to verbal and physical stimuli - answers questions and can verbalize pain location.

## 2013-02-05 ENCOUNTER — Inpatient Hospital Stay (HOSPITAL_COMMUNITY): Payer: PRIVATE HEALTH INSURANCE

## 2013-02-05 ENCOUNTER — Telehealth: Payer: Self-pay | Admitting: *Deleted

## 2013-02-05 ENCOUNTER — Encounter (HOSPITAL_COMMUNITY): Payer: Self-pay | Admitting: Internal Medicine

## 2013-02-05 ENCOUNTER — Telehealth: Payer: Self-pay | Admitting: Oncology

## 2013-02-05 DIAGNOSIS — I10 Essential (primary) hypertension: Secondary | ICD-10-CM | POA: Diagnosis present

## 2013-02-05 DIAGNOSIS — G20A1 Parkinson's disease without dyskinesia, without mention of fluctuations: Secondary | ICD-10-CM | POA: Diagnosis present

## 2013-02-05 DIAGNOSIS — G20 Parkinson's disease: Secondary | ICD-10-CM | POA: Diagnosis present

## 2013-02-05 DIAGNOSIS — N390 Urinary tract infection, site not specified: Secondary | ICD-10-CM | POA: Diagnosis present

## 2013-02-05 DIAGNOSIS — R509 Fever, unspecified: Secondary | ICD-10-CM | POA: Diagnosis present

## 2013-02-05 DIAGNOSIS — G629 Polyneuropathy, unspecified: Secondary | ICD-10-CM | POA: Diagnosis present

## 2013-02-05 LAB — TROPONIN I
Troponin I: <0.3 ng/mL (ref <0.30)
Troponin I: <0.3 ng/mL (ref <0.30)
Troponin I: <0.3 ng/mL (ref <0.30)

## 2013-02-05 LAB — COMPREHENSIVE METABOLIC PANEL
ALBUMIN: 2.5 g/dL — AB (ref 3.5–5.2)
ALK PHOS: 196 U/L — AB (ref 39–117)
ALT: 20 U/L (ref 0–35)
AST: 66 U/L — AB (ref 0–37)
BUN: 32 mg/dL — AB (ref 6–23)
CO2: 27 mEq/L (ref 19–32)
Calcium: 8.4 mg/dL (ref 8.4–10.5)
Chloride: 103 mEq/L (ref 96–112)
Creatinine, Ser: 0.63 mg/dL (ref 0.50–1.10)
GFR calc Af Amer: >90 mL/min (ref >90)
GFR calc non Af Amer: 88 mL/min — ABNORMAL LOW (ref >90)
Glucose, Bld: 99 mg/dL (ref 70–99)
POTASSIUM: 3.7 meq/L (ref 3.7–5.3)
SODIUM: 143 meq/L (ref 137–147)
Total Bilirubin: 0.9 mg/dL (ref 0.3–1.2)
Total Protein: 6 g/dL (ref 6.0–8.3)

## 2013-02-05 LAB — TSH: TSH: 1.321 u[IU]/mL (ref 0.350–4.500)

## 2013-02-05 LAB — INFLUENZA PANEL BY PCR (TYPE A & B)
H1N1FLUPCR: NOT DETECTED
INFLBPCR: NEGATIVE
Influenza A By PCR: NEGATIVE

## 2013-02-05 LAB — CBC
HCT: 37.5 % (ref 36.0–46.0)
Hemoglobin: 13.3 g/dL (ref 12.0–15.0)
MCH: 31.8 pg (ref 26.0–34.0)
MCHC: 35.5 g/dL (ref 30.0–36.0)
MCV: 89.7 fL (ref 78.0–100.0)
PLATELETS: 159 10*3/uL (ref 150–400)
RBC: 4.18 MIL/uL (ref 3.87–5.11)
RDW: 15 % (ref 11.5–15.5)
WBC: 5.9 10*3/uL (ref 4.0–10.5)

## 2013-02-05 LAB — GLUCOSE, CAPILLARY
GLUCOSE-CAPILLARY: 164 mg/dL — AB (ref 70–99)
GLUCOSE-CAPILLARY: 83 mg/dL (ref 70–99)
Glucose-Capillary: 130 mg/dL — ABNORMAL HIGH (ref 70–99)
Glucose-Capillary: 167 mg/dL — ABNORMAL HIGH (ref 70–99)
Glucose-Capillary: 159 mg/dL — ABNORMAL HIGH (ref 70–99)

## 2013-02-05 LAB — MRSA PCR SCREENING: MRSA by PCR: NEGATIVE

## 2013-02-05 LAB — PHOSPHORUS: PHOSPHORUS: 3.6 mg/dL (ref 2.3–4.6)

## 2013-02-05 LAB — HEMOGLOBIN A1C
Hgb A1c MFr Bld: 8.5 % — ABNORMAL HIGH (ref <5.7)
MEAN PLASMA GLUCOSE: 197 mg/dL — AB (ref <117)

## 2013-02-05 LAB — MAGNESIUM: Magnesium: 2.3 mg/dL (ref 1.5–2.5)

## 2013-02-05 MED ORDER — ALPRAZOLAM 0.5 MG PO TABS: 0.5000 mg | ORAL_TABLET | Freq: Three times a day (TID) | ORAL | Status: DC | PRN

## 2013-02-05 MED ORDER — ATORVASTATIN CALCIUM 10 MG PO TABS
10.0000 mg | ORAL_TABLET | Freq: Every day | ORAL | Status: DC
  Administered 2013-02-05: 10 mg via ORAL
  Filled 2013-02-05 (×2): qty 1

## 2013-02-05 MED ORDER — TEMAZEPAM 15 MG PO CAPS: 15.0000 mg | ORAL_CAPSULE | Freq: Every evening | ORAL | Status: DC | PRN

## 2013-02-05 MED ORDER — CARBIDOPA-LEVODOPA 25-100 MG PO TABS
1.0000 | ORAL_TABLET | Freq: Three times a day (TID) | ORAL | Status: DC
  Administered 2013-02-05 – 2013-02-06 (×5): 1 via ORAL
  Filled 2013-02-05 (×6): qty 1

## 2013-02-05 MED ORDER — PANTOPRAZOLE SODIUM 20 MG PO TBEC
20.0000 mg | DELAYED_RELEASE_TABLET | Freq: Every day | ORAL | Status: DC
  Administered 2013-02-05 – 2013-02-06 (×2): 20 mg via ORAL
  Filled 2013-02-05 (×2): qty 1

## 2013-02-05 MED ORDER — SODIUM CHLORIDE 0.9 % IV SOLN
INTRAVENOUS | Status: AC
  Administered 2013-02-05: 03:00:00 via INTRAVENOUS

## 2013-02-05 MED ORDER — METOPROLOL SUCCINATE ER 25 MG PO TB24
25.0000 mg | ORAL_TABLET | Freq: Every day | ORAL | Status: DC
  Administered 2013-02-05 – 2013-02-06 (×2): 25 mg via ORAL
  Filled 2013-02-05 (×2): qty 1

## 2013-02-05 MED ORDER — HYDROXYZINE HCL 10 MG PO TABS
10.0000 mg | ORAL_TABLET | Freq: Three times a day (TID) | ORAL | Status: DC | PRN
  Filled 2013-02-05: qty 1

## 2013-02-05 MED ORDER — FLUTICASONE PROPIONATE 50 MCG/ACT NA SUSP
2.0000 | Freq: Every day | NASAL | Status: DC
  Filled 2013-02-05: qty 16

## 2013-02-05 MED ORDER — ENOXAPARIN SODIUM 40 MG/0.4ML ~~LOC~~ SOLN
40.0000 mg | SUBCUTANEOUS | Status: DC
  Administered 2013-02-05 – 2013-02-06 (×2): 40 mg via SUBCUTANEOUS
  Filled 2013-02-05 (×2): qty 0.4

## 2013-02-05 MED ORDER — POLYETHYLENE GLYCOL 3350 17 G PO PACK
17.0000 g | PACK | Freq: Every day | ORAL | Status: DC
  Administered 2013-02-05 – 2013-02-06 (×2): 17 g via ORAL
  Filled 2013-02-05 (×2): qty 1

## 2013-02-05 MED ORDER — GABAPENTIN 600 MG PO TABS
600.0000 mg | ORAL_TABLET | Freq: Three times a day (TID) | ORAL | Status: DC
  Administered 2013-02-05 – 2013-02-06 (×5): 600 mg via ORAL
  Filled 2013-02-05 (×6): qty 1

## 2013-02-05 MED ORDER — SODIUM CHLORIDE 0.9 % IJ SOLN
3.0000 mL | Freq: Two times a day (BID) | INTRAMUSCULAR | Status: DC
  Administered 2013-02-05 – 2013-02-06 (×3): 3 mL via INTRAVENOUS

## 2013-02-05 MED ORDER — DULOXETINE HCL 60 MG PO CPEP
60.0000 mg | ORAL_CAPSULE | Freq: Two times a day (BID) | ORAL | Status: DC
  Administered 2013-02-05 – 2013-02-06 (×3): 60 mg via ORAL
  Filled 2013-02-05 (×4): qty 1

## 2013-02-05 MED ORDER — IPRATROPIUM-ALBUTEROL 0.5-2.5 (3) MG/3ML IN SOLN: 3.0000 mL | Freq: Four times a day (QID) | RESPIRATORY_TRACT | Status: DC | PRN

## 2013-02-05 MED ORDER — METRONIDAZOLE 500 MG PO TABS
500.0000 mg | ORAL_TABLET | Freq: Three times a day (TID) | ORAL | Status: DC
  Administered 2013-02-05 – 2013-02-06 (×3): 500 mg via ORAL
  Filled 2013-02-05 (×5): qty 1

## 2013-02-05 MED ORDER — INSULIN GLARGINE 100 UNIT/ML ~~LOC~~ SOLN
100.0000 [IU] | Freq: Two times a day (BID) | SUBCUTANEOUS | Status: DC
  Administered 2013-02-05 – 2013-02-06 (×3): 100 [IU] via SUBCUTANEOUS
  Filled 2013-02-05 (×5): qty 1

## 2013-02-05 MED ORDER — LORATADINE 10 MG PO TABS
10.0000 mg | ORAL_TABLET | Freq: Every day | ORAL | Status: DC
  Administered 2013-02-05 – 2013-02-06 (×2): 10 mg via ORAL
  Filled 2013-02-05 (×2): qty 1

## 2013-02-05 MED ORDER — DOCUSATE SODIUM 100 MG PO CAPS
100.0000 mg | ORAL_CAPSULE | Freq: Two times a day (BID) | ORAL | Status: DC
  Administered 2013-02-05 – 2013-02-06 (×3): 100 mg via ORAL
  Filled 2013-02-05 (×4): qty 1

## 2013-02-05 MED ORDER — OSELTAMIVIR PHOSPHATE 75 MG PO CAPS
75.0000 mg | ORAL_CAPSULE | Freq: Two times a day (BID) | ORAL | Status: DC
Start: 2013-02-05 — End: 2013-02-06
  Administered 2013-02-05 – 2013-02-06 (×3): 75 mg via ORAL
  Filled 2013-02-05 (×5): qty 1

## 2013-02-05 MED ORDER — MELATONIN 3 MG PO TABS: 3.0000 mg | ORAL_TABLET | Freq: Every day | ORAL | Status: DC

## 2013-02-05 MED ORDER — ARIPIPRAZOLE 5 MG PO TABS
7.0000 mg | ORAL_TABLET | Freq: Every day | ORAL | Status: DC
  Administered 2013-02-05: 7 mg via ORAL
  Filled 2013-02-05 (×2): qty 1

## 2013-02-05 MED ORDER — ARIPIPRAZOLE 2 MG PO TABS: 2.0000 mg | ORAL_TABLET | Freq: Every day | ORAL | Status: DC

## 2013-02-05 MED ORDER — ARIPIPRAZOLE 5 MG PO TABS: 5.0000 mg | ORAL_TABLET | Freq: Every day | ORAL | Status: DC

## 2013-02-05 MED ORDER — HYDROCODONE-ACETAMINOPHEN 5-325 MG PO TABS: 2.0000 | ORAL_TABLET | Freq: Four times a day (QID) | ORAL | Status: DC | PRN

## 2013-02-05 MED ORDER — CEPHALEXIN 500 MG PO CAPS
500.0000 mg | ORAL_CAPSULE | Freq: Two times a day (BID) | ORAL | Status: DC
  Administered 2013-02-05: 500 mg via ORAL
  Filled 2013-02-05 (×2): qty 1

## 2013-02-05 MED ORDER — ONDANSETRON HCL 4 MG/2ML IJ SOLN: 4.0000 mg | Freq: Four times a day (QID) | INTRAMUSCULAR | Status: DC | PRN

## 2013-02-05 MED ORDER — ACETAMINOPHEN 325 MG PO TABS: 650.0000 mg | ORAL_TABLET | Freq: Four times a day (QID) | ORAL | Status: DC | PRN

## 2013-02-05 MED ORDER — ANASTROZOLE 1 MG PO TABS
1.0000 mg | ORAL_TABLET | Freq: Every day | ORAL | Status: DC
  Administered 2013-02-05 – 2013-02-06 (×2): 1 mg via ORAL
  Filled 2013-02-05 (×2): qty 1

## 2013-02-05 MED ORDER — INSULIN ASPART 100 UNIT/ML ~~LOC~~ SOLN: 0.0000 [IU] | Freq: Every day | SUBCUTANEOUS | Status: DC

## 2013-02-05 MED ORDER — INSULIN ASPART 100 UNIT/ML ~~LOC~~ SOLN
0.0000 [IU] | Freq: Three times a day (TID) | SUBCUTANEOUS | Status: DC
  Administered 2013-02-05: 3 [IU] via SUBCUTANEOUS
  Administered 2013-02-05: 4 [IU] via SUBCUTANEOUS

## 2013-02-05 MED ORDER — BISACODYL 10 MG RE SUPP
10.0000 mg | Freq: Every day | RECTAL | Status: DC | PRN
Start: 2013-02-05 — End: 2013-02-06

## 2013-02-05 MED ORDER — DEXTROSE 5 % IV SOLN
1.0000 g | INTRAVENOUS | Status: DC
  Administered 2013-02-05: 1 g via INTRAVENOUS
  Filled 2013-02-05 (×2): qty 10

## 2013-02-05 MED ORDER — ONDANSETRON HCL 4 MG PO TABS: 4.0000 mg | ORAL_TABLET | Freq: Four times a day (QID) | ORAL | Status: DC | PRN

## 2013-02-05 MED ORDER — ACETAMINOPHEN 650 MG RE SUPP: 650.0000 mg | Freq: Four times a day (QID) | RECTAL | Status: DC | PRN

## 2013-02-05 MED ORDER — LISINOPRIL 2.5 MG PO TABS
2.5000 mg | ORAL_TABLET | Freq: Every day | ORAL | Status: DC
  Administered 2013-02-05 – 2013-02-06 (×2): 2.5 mg via ORAL
  Filled 2013-02-05 (×2): qty 1

## 2013-02-05 NOTE — Progress Notes (Signed)
Utilization review completed.  

## 2013-02-05 NOTE — Progress Notes (Signed)
This CSW called pt's son to verify pt is from [facility] and they want her to go back. CSW explained in voicemail that pt likely discharging today or tomorrow, and requested pt's son call unit CSW letting him know he got voicemail and that pt can return to [facility]. This CSW signing off; unit CSW will follow-up concerning discharge.   Ky Barban, MSW, [facility] Clinical Social Worker [phone]

## 2013-02-05 NOTE — Care Management Note (Signed)
    Page 1 of 1   02/06/2013     12:56:23 PM   CARE MANAGEMENT NOTE 02/06/2013  Patient:  DABNEY, DEVER   Account Number:  [id number]  Date Initiated:  02/05/2013  Documentation initiated by:  Tomi Bamberger  Subjective/Objective Assessment:   dx ams, fever  admit- from [facility]     Action/Plan:   Anticipated DC Date:  02/06/2013   Anticipated DC Plan:  [place]  In-house referral  Clinical Social Worker      DC Planning Services  CM consult      Choice offered to / List presented to:             Status of service:  Completed, signed off Medicare Important Message given?   (If response is "NO", the following Medicare IM given date fields will be blank) Date Medicare IM given:   Date Additional Medicare IM given:    Discharge Disposition:  [place]  Per UR Regulation:  Reviewed for med. necessity/level of care/duration of stay  If discussed at [place] of Stay Meetings, dates discussed:    Comments:  02/05/13 Tomi Bamberger RN,BSN [phone] patient from [facility], CSW referral.

## 2013-02-05 NOTE — Progress Notes (Signed)
Received report about patient. Awaiting patient's arrival.

## 2013-02-05 NOTE — H&P (Signed)
PCP:  Wenda Low, MD    Chief Complaint:  Altered mental status  HPI: Katherine Carr is a 73 y.o. female   has a past medical history of Depression; Diabetes mellitus; Hypertension; Peripheral neuropathy; Diverticulosis; Urinary incontinence; Herpes; Ovarian cyst; Fibrous breast lumps; Wears glasses; Fibromyalgia; GERD (gastroesophageal reflux disease); COPD (chronic obstructive pulmonary disease); Asthma; Fatty liver; Anxiety; Left carotid stenosis; Breast cancer; Osteoarthritis; H/O hiatal hernia; and Migraines.   Presented with  Patient was brought from [facility] with fever up to 101. She had recent UTI and as treated with keflex. There was some question of mental status changes but she apears to be alert and orient to year and situation .  Patient has flat affect and pill rolling tremor. Denies any recent chest pain. She has been coughing.   [facility] on arrival patient first was poorly responsive. A definite getting some IV fluids she's not doing better. Patient is awake and answering questions. She appears to be somewhat belligerent. No family at bedside unclear what her baseline is. Per notes from her  nursing home patient was noted to be confused and had a blood pressure elevated to 200/80 range satting 93% on room a.m. chest x-ray so far has been unremarkable. Hospital is cough on admission  Review of Systems:   Pertinent positives include: Fevers, chills, backpain  Constitutional:  No weight loss, night sweats,  fatigue, weight loss  HEENT:  No headaches, Difficulty swallowing,Tooth/dental problems,Sore throat,  No sneezing, itching, ear ache, nasal congestion, post nasal drip,  Cardio-vascular:  No chest pain, Orthopnea, PND, anasarca, dizziness, palpitations.no Bilateral lower extremity swelling  GI:  No heartburn, indigestion, abdominal pain, nausea, vomiting, diarrhea, change in bowel habits, loss of appetite, melena, blood in stool, hematemesis Resp:  no  shortness of breath at rest. No dyspnea on exertion, No excess mucus, no productive cough, No non-productive cough, No coughing up of blood.No change in color of mucus.No wheezing. Skin:  no rash or lesions. No jaundice GU:  no dysuria, change in color of urine, no urgency or frequency. No straining to urinate.  No flank pain.  Musculoskeletal:  No joint pain or no joint swelling. No decreased range of motion. No back pain.  Psych:  No change in mood or affect. No depression or anxiety. No memory loss.  Neuro: no localizing neurological complaints, no tingling, no weakness, no double vision, no gait abnormality, no slurred speech, no confusion  Otherwise ROS are negative except for above, 10 systems were reviewed  Past Medical History: Past Medical History  Diagnosis Date  . Depression   . Diabetes mellitus   . Hypertension   . Peripheral neuropathy   . Diverticulosis   . Urinary incontinence   . Herpes   . Ovarian cyst   . Fibrous breast lumps   . Wears glasses   . Fibromyalgia   . GERD (gastroesophageal reflux disease)   . COPD (chronic obstructive pulmonary disease)   . Asthma     bronchial  . Fatty liver     hx: of  . Anxiety   . Left carotid stenosis     Hx: of  . Breast cancer     "both sides" (10/29/2012)  . Osteoarthritis     Archie Endo 09/12/2007  (10/29/2012)  . H/O hiatal hernia     Archie Endo 09/12/2007  (10/29/2012)  . Migraines     Archie Endo 09/01/2001 (10/29/2012)   Past Surgical History  Procedure Laterality Date  . Carpal tunnel release Bilateral   .  Cesarean section    . Abdominal hysterectomy    . Myomectomy    . Breast lumpectomy with needle localization Right 04/02/2012    Procedure: Excision Ductal System Right Breast with Needle Localization;  Surgeon: Adin Hector, MD;  Location: [place];  Service: General;  Laterality: Right;  needle localization at 7:30 at [facility]   . Nevus excision Left 04/02/2012    Procedure: Excision Nevus Left  Abdominal Wall;  Surgeon: Adin Hector, MD;  Location: [place];  Service: General;  Laterality: Left;  . Breast lumpectomy Right 04/21/2012    Procedure: Right breast LUMPECTOMY with re-excision of margins;  Surgeon: Adin Hector, MD;  Location: [place];  Service: General;  Laterality: Right;  . Tonsillectomy    . Appendectomy    . Breast ductal system excision Left     Archie Endo 04/02/2012  (10/29/2012)  . Back surgery      central decompression L5-S1/notes 10/23/2005 (10/29/2012)  . Anterior cervical decomp/discectomy fusion  09/2003    Archie Endo 09/25/2003 (10/29/2012)     Medications: Prior to Admission medications   Medication Sig Start Date End Date Taking? Authorizing Provider  ALPRAZolam Duanne Moron) 0.5 MG tablet Take 1 tablet (0.5 mg total) by mouth 3 (three) times daily as needed for anxiety. 10/30/12   Geradine Girt, DO  alum & mag hydroxide-simeth (MAALOX/MYLANTA) 200-200-20 MG/5ML suspension Take 30 mLs by mouth every 4 (four) hours as needed for indigestion.    Historical Provider, MD  anastrozole (ARIMIDEX) 1 MG tablet Take 1 mg by mouth daily.    Historical Provider, MD  ARIPiprazole (ABILIFY) 2 MG tablet Take 2 mg by mouth daily. *takes with 5mg  tablet for 7mg  dose    Historical Provider, MD  ARIPiprazole (ABILIFY) 5 MG tablet Take 5 mg by mouth at bedtime. *takes with 2mg  tablet for 7mg  dose    Historical Provider, MD  atorvastatin (LIPITOR) 10 MG tablet Take 10 mg by mouth at bedtime.    Historical Provider, MD  bisacodyl (DULCOLAX) 10 MG suppository Place 10 mg rectally daily as needed for constipation.     Historical Provider, MD  carbidopa-levodopa (SINEMET) 25-100 MG per tablet Take 0.5tab BID x3d, then 0.5tab TID x4d, then 1tab TID 11/13/12   Dudley Major, DO  cephALEXin (KEFLEX) 500 MG capsule Take 1 capsule (500 mg total) by mouth 2 (two) times daily. 10/30/12   Geradine Girt, DO  cetirizine (ZYRTEC) 5 MG tablet Take 5 mg by mouth daily as needed  for allergies.     Historical Provider, MD  Cholecalciferol (VITAMIN D3) 1000 UNITS CAPS Take 2,000 Units by mouth daily.    Historical Provider, MD  clotrimazole-betamethasone (LOTRISONE) cream  10/13/12   Historical Provider, MD  diclofenac sodium (VOLTAREN) 1 % GEL Apply 4 g topically 3 (three) times daily as needed (joint pain).    Historical Provider, MD  DULoxetine (CYMBALTA) 60 MG capsule Take 60 mg by mouth 2 (two) times daily.    Historical Provider, MD  fluticasone (FLONASE) 50 MCG/ACT nasal spray Place 2 sprays into the nose daily as needed for rhinitis or allergies.    Historical Provider, MD  gabapentin (NEURONTIN) 600 MG tablet Take 600 mg by mouth 3 (three) times daily.      Historical Provider, MD  HUMALOG 100 UNIT/ML injection Inject 48-84 Units into the skin 3 (three) times daily before meals. 70-150= 48 units, 151-200= 54 units, 201-250= 60 units, 251-300 =66 units, 301-350= 72 units,  351-400= 78 units, over 400= 84 units. 05/23/11   Historical Provider, MD  HYDROcodone-acetaminophen (NORCO/VICODIN) 5-325 MG per tablet Take 2 tablets by mouth every 6 (six) hours as needed for pain. 10/30/12   Geradine Girt, DO  hydroxypropyl methylcellulose (ISOPTO TEARS) 2.5 % ophthalmic solution Place 1 drop into both eyes 2 (two) times daily as needed.     Historical Provider, MD  hydrOXYzine (ATARAX/VISTARIL) 10 MG tablet Take 10 mg by mouth every 8 (eight) hours as needed for itching.    Historical Provider, MD  insulin glargine (LANTUS) 100 UNIT/ML injection Inject 0.3 mLs (30 Units total) into the skin 2 (two) times daily. 10/30/12   Geradine Girt, DO  ipratropium-albuterol (DUONEB) 0.5-2.5 (3) MG/3ML SOLN Take 3 mLs by nebulization every 6 (six) hours as needed (shortness of breath).    Historical Provider, MD  lansoprazole (PREVACID) 30 MG capsule Take 30 mg by mouth daily.    Historical Provider, MD  lidocaine (LIDODERM) 5 % Place 1 patch onto the skin daily. Remove & Discard patch within  12 hours or as directed by MD    Historical Provider, MD  lisinopril (PRINIVIL,ZESTRIL) 2.5 MG tablet Take 2.5 mg by mouth daily.  05/18/11   Historical Provider, MD  Melatonin 3 MG TABS Take 3 mg by mouth at bedtime.    Historical Provider, MD  metoprolol succinate (TOPROL-XL) 25 MG 24 hr tablet Take 25 mg by mouth daily.      Historical Provider, MD  Sennosides-Docusate Sodium (SENNA S PO) Take 1 tablet by mouth 2 (two) times daily.     Historical Provider, MD  temazepam (RESTORIL) 15 MG capsule Take 15 mg by mouth at bedtime as needed (for insomnia).  05/07/12   Historical Provider, MD  triamcinolone cream (KENALOG) 0.1 %  10/27/12   Historical Provider, MD    Allergies:   Allergies  Allergen Reactions  . Sertraline Hcl Hives  . Codeine Hives and Swelling  . Cyclobenzaprine Hcl Hives and Swelling  . Fentanyl     REACTION: PANIC ATTACKS  . Furosemide     REACTION: face was swollen and hand  . Gadolinium Derivatives Swelling  . Hydrocodone-Acetaminophen   . Iohexol      Code: SOB, Desc: xray dye, Onset Date: [id number]   . Levofloxacin     REACTION: PANIC ATTACK,DIARRHEA,EYES DISCOLORD  . Lorazepam Nausea And Vomiting  . Lubiprostone     REACTION: REALLY BAD NAUSEA  . Metformin Hives and Swelling  . Metoclopramide Nausea And Vomiting and Swelling  . Morphine     REACTION: itching, panic attacks  . Oxycodone Hives and Swelling  . Promethazine Hcl     REACTION: TRAMA ANDJITTERS IN MY BODY  . Rosiglitazone Hives and Swelling  . Sertraline Hcl Hives and Swelling  . Verapamil Hives and Nausea And Vomiting    Social History:  States she is bed bound Lives at [facility]   reports that she quit smoking about 16 years ago. Her smoking use included Cigarettes. She smoked 0.00 packs per day for 20 years. She has never used smokeless tobacco. She reports that she does not drink alcohol or use illicit drugs.   Family History: family history includes Arthritis in her sister;  Cancer in her brother; Cirrhosis in her brother; Diabetes in her brother; Heart disease in her brother and mother; Stroke in her sister.    Physical Exam: Patient Vitals for the past 24 hrs:  BP Temp Temp src Pulse Resp SpO2  02/04/13 2330 134/64 mmHg - - 102 20 97 %  02/04/13 2230 130/62 mmHg - - 106 21 96 %  02/04/13 2205 131/60 mmHg 101 F (38.3 C) Rectal 107 23 93 %  02/04/13 2146 136/72 mmHg 100.7 F (38.2 C) Oral 109 30 96 %    1. General:  in No Acute distress 2. Psychological: Alert and  Oriented to self, situation 3. Head/ENT:    Dry Mucous Membranes                          Head Non traumatic, neck supple                          Normal  Dentition 4. SKIN:  decreased Skin turgor,  Skin clean Dry and intact no rash 5. Heart: Rapid but Regular rate and rhythm no Murmur, Rub or gallop 6. Lungs:no wheezes or crackles  unable to set up patient 7. Abdomen: Soft, non-tender, Non distended 8. Lower extremities: no clubbing, cyanosis, or edema 9. Neurologically slow movement of the upper extremities no significant movement of the lower extremities noted. By mouth rolling tremor of the left upper extremity 10. MSK: Normal range of motion  body mass index is unknown because there is no weight on file.   Labs on Admission:   Recent Labs  02/04/13 2241  NA 140  K 3.6*  CL 100  CO2 25  GLUCOSE 178*  BUN 35*  CREATININE 0.65  CALCIUM 8.7    Recent Labs  02/04/13 2241  AST 45*  ALT 20  ALKPHOS 157*  BILITOT 0.5  PROT 6.3  ALBUMIN 2.6*   No results found for this basename: LIPASE, AMYLASE,  in the last 72 hours  Recent Labs  02/04/13 2241  WBC 6.1  NEUTROABS 4.5  HGB 13.5  HCT 37.6  MCV 89.5  PLT 158   No results found for this basename: CKTOTAL, CKMB, CKMBINDEX, TROPONINI,  in the last 72 hours No results found for this basename: TSH, T4TOTAL, FREET3, T3FREE, THYROIDAB,  in the last 72 hours No results found for this basename: VITAMINB12, FOLATE,  FERRITIN, TIBC, IRON, RETICCTPCT,  in the last 72 hours Lab Results  Component Value Date   HGBA1C  Value: 6.2 (NOTE)                                                                       According to the ADA Clinical Practice Recommendations for 2011, when HbA1c is used as a screening test:   >=6.5%   Diagnostic of Diabetes Mellitus           (if abnormal result  is confirmed)  5.7-6.4%   Increased risk of developing Diabetes Mellitus  References:Diagnosis and Classification of Diabetes Mellitus,Diabetes [id number] 1):S62-S69 and Standards of Medical Care in         Diabetes - 2011,Diabetes Care,2011,34  (Suppl 1):S11-S61.* 04/08/2010    The CrCl is unknown because both a height and weight (above a minimum accepted value) are required for this calculation. ABG    Component Value Date/Time   PHART 7.431 10/29/2012 0340   HCO3 27.3* 10/29/2012 0340   TCO2 28.6 10/29/2012 0340  O2SAT 94.2 10/29/2012 0340     Lab Results  Component Value Date   DDIMER  Value: <0.22        AT THE INHOUSE ESTABLISHED CUTOFF VALUE OF 0.48 ug/mL FEU, THIS ASSAY HAS BEEN DOCUMENTED IN THE LITERATURE TO HAVE A SENSITIVITY AND NEGATIVE PREDICTIVE VALUE OF AT LEAST 98 TO 99%.  THE TEST RESULT SHOULD BE CORRELATED WITH AN ASSESSMENT OF THE CLINICAL PROBABILITY OF DVT / VTE. 01/01/2009     Other results:  I have pearsonaly reviewed this: ECG REPORT  Rate: 90  Rhythm: Sinus rhythm ST&T Change: No ischemic change  UA no evidence of UTI,  but cloudy   Cultures:    Component Value Date/Time   SDES URINE, CLEAN CATCH 10/29/2012 1405   SPECREQUEST NONE 10/29/2012 1405   CULT  Value: NO GROWTH Performed at [facility] 10/29/2012 1405   REPTSTATUS 10/30/2012 FINAL 10/29/2012 1405       Radiological Exams on Admission: Dg Chest Port 1 View  02/04/2013   CLINICAL DATA:  Fever and altered mental status.  EXAM: PORTABLE CHEST - 1 VIEW  COMPARISON:  PA and lateral chest 10/28/2012.  FINDINGS: The  patient is slightly rotated on the study. Lungs are clear. Heart size is upper normal. No pneumothorax or pleural effusion.  IMPRESSION: No acute disease.   Electronically Signed   By: Inge Rise M.D.   On: 02/04/2013 23:16    Chart has been reviewed  Assessment/Plan  73 year old female history of Parkinson disease and depression wheelchair bound secondary to diabetic neuropathy per patient here with altered mental status currently improving and fever  Present on Admission:  . Acute encephalopathy - most likely metabolic. Currently improving  vitals close to baseline. We'll obtain CT of the head given history of breast cancer and altered mental status  . DIABETES MELLITUS, TYPE II - no rectal sliding scale, lantus 100 unit BID as per nursing home records . HYPERTENSION - continue home medications , urgent blood pressure does spend a control  . UTI (lower urinary tract infection) - continue Keflex currently appears to be under control  . Neuropathy - continue Neurontin  . Parkinson disease - continue home medications  . Febrile illness, acute- -foot precautions, influenza PCR, Tamiflu History of breast cancer - will need to notify oncology in a.m. to continue her chemotherapy  Prophylaxis:  Lovenox, Protonix  CODE STATUS: FULL CODE  Other plan as per orders.  I have spent a total of  55 min on this admission,   Quinta Eimer 02/05/2013, 12:35 AM

## 2013-02-05 NOTE — Progress Notes (Signed)
  Pt admitted to the unit. Pt is stable, alert and oriented per baseline. Oriented to room, staff, and call bell. Educated to call for any assistance. Bed in lowest position, call bell within reach- will continue to monitor. 

## 2013-02-05 NOTE — Progress Notes (Addendum)
[facility] PROGRESS NOTE  OZELLE BRUBACHER ZOX:[id number] DOB: [birth year] DOA: 02/04/2013 PCP: Wenda Low, MD  Assessment/Plan: Active Problems:   DIABETES MELLITUS, TYPE II: A1c elevated. Currently on home dose of Lantus, will increase upon discharge   HYPERTENSION: Better now. May of been elevated secondary agitation if she is clinically dehydrated   LIVER FUNCTION TESTS, ABNORMAL, HX OF   Acute encephalopathy: Better with IV fluids   UTI (lower urinary tract infection)   Neuropathy: Continue neuropathic medication   HTN (hypertension), malignant   Parkinson disease: Continue Sinemet   Febrile illness, acute: Unclear etiology. May of been resistant UTI versus bacterial translocation from gout. Given absent bowel sounds in reports of diarrhea, check abdominal x-ray to look for chronic stool retention.  Code Status: Full code Family Communication: Left message with friend who is the medical power of attorney Disposition Plan: Back to skilled nursing once treatment made   Consultants:  None  Procedures:  Abdominal x-ray pending  Antibiotics:  On Tamiflu day one-discontinued  Keflex 2/5-discontinued  IV Rocephin 2/5-present  By mouth Flagyl 2/5-present  HPI/Subjective: Patient doing okay. States she doesn't feel right. She states that she was having diarrhea earlier, but not now. Denies any chest pain or shortness of breath She's not exactly sure why she is here.  Objective: Filed Vitals:   02/05/13 1210  BP: 136/66  Pulse:   Temp:   Resp:     Intake/Output Summary (Last 24 hours) at 02/05/13 1323 Last data filed at 02/05/13 1208  Gross per 24 hour  Intake      3 ml  Output      0 ml  Net      3 ml   Filed Weights   02/05/13 0227  Weight: 88.179 kg (194 lb 6.4 oz)    Exam:   General: Alert and oriented x2, no acute distress  Cardiovascular: Regular rate and rhythm, S1-S2  Respiratory: Clear to auscultation bilaterally, decreased breath  sounds throughout  Abdomen: Soft, distended, nontender, absent bowel sounds  Musculoskeletal: No clubbing or cyanosis, trace edema   Data Reviewed: Basic Metabolic Panel:  Recent Labs Lab 02/04/13 2241 02/05/13 0716  NA 140 143  K 3.6* 3.7  CL 100 103  CO2 25 27  GLUCOSE 178* 99  BUN 35* 32*  CREATININE 0.65 0.63  CALCIUM 8.7 8.4  MG  --  2.3  PHOS  --  3.6   Liver Function Tests:  Recent Labs Lab 02/04/13 2241 02/05/13 0716  AST 45* 66*  ALT 20 20  ALKPHOS 157* 196*  BILITOT 0.5 0.9  PROT 6.3 6.0  ALBUMIN 2.6* 2.5*   No results found for this basename: LIPASE, AMYLASE,  in the last 168 hours  Recent Labs Lab 02/04/13 2253  AMMONIA 34   CBC:  Recent Labs Lab 02/04/13 2241 02/05/13 0716  WBC 6.1 5.9  NEUTROABS 4.5  --   HGB 13.5 13.3  HCT 37.6 37.5  MCV 89.5 89.7  PLT 158 159   Cardiac Enzymes:  Recent Labs Lab 02/05/13 0716 02/05/13 0855  TROPONINI <0.30 <0.30   BNP (last 3 results) No results found for this basename: PROBNP,  in the last 8760 hours CBG:  Recent Labs Lab 02/05/13 0247 02/05/13 0901  GLUCAP 164* 83    Recent Results (from the past 240 hour(s))  MRSA PCR SCREENING     Status: None   Collection Time    02/05/13  2:22 AM      Result Value Range  Status   MRSA by PCR NEGATIVE  NEGATIVE Final   Comment:            The GeneXpert MRSA Assay (FDA     approved for NASAL specimens     only), is one component of a     comprehensive MRSA colonization     surveillance program. It is not     intended to diagnose MRSA     infection nor to guide or     monitor treatment for     MRSA infections.     Studies: Ct Head Wo Contrast  02/05/2013   CLINICAL DATA:  Altered mental status, sepsis  EXAM: CT HEAD WITHOUT CONTRAST  TECHNIQUE: Contiguous axial images were obtained from the base of the skull through the vertex without intravenous contrast.  COMPARISON:  Prior MRI from 10/28/2012  FINDINGS: Mild cerebral atrophy with  chronic microvascular ischemic changes again noted, unchanged.  There is no acute intracranial hemorrhage or infarct. No mass lesion or midline shift. Gray-white matter differentiation is well maintained. Ventricles are normal in size without evidence of hydrocephalus. No extra-axial fluid collection.  The calvarium is intact.  Orbital soft tissues are within normal limits.  The paranasal sinuses and mastoid air cells are well pneumatized and free of fluid.  Scalp soft tissues are unremarkable.  IMPRESSION: 1. No acute intracranial abnormality. 2. Mild age-related atrophy and chronic microvascular ischemic changes.   Electronically Signed   By: Jeannine Boga M.D.   On: 02/05/2013 06:05   Dg Chest Port 1 View  02/04/2013   CLINICAL DATA:  Fever and altered mental status.  EXAM: PORTABLE CHEST - 1 VIEW  COMPARISON:  PA and lateral chest 10/28/2012.  FINDINGS: The patient is slightly rotated on the study. Lungs are clear. Heart size is upper normal. No pneumothorax or pleural effusion.  IMPRESSION: No acute disease.   Electronically Signed   By: Inge Rise M.D.   On: 02/04/2013 23:16    Scheduled Meds: . anastrozole  1 mg Oral Daily  . ARIPiprazole  7 mg Oral QHS  . atorvastatin  10 mg Oral QHS  . carbidopa-levodopa  1 tablet Oral TID  . cephALEXin  500 mg Oral BID  . docusate sodium  100 mg Oral BID  . DULoxetine  60 mg Oral BID  . enoxaparin (LOVENOX) injection  40 mg Subcutaneous Q24H  . fluticasone  2 spray Each Nare Daily  . gabapentin  600 mg Oral TID  . insulin aspart  0-20 Units Subcutaneous TID WC  . insulin aspart  0-5 Units Subcutaneous QHS  . insulin glargine  100 Units Subcutaneous BID  . lisinopril  2.5 mg Oral Daily  . loratadine  10 mg Oral Daily  . metoprolol succinate  25 mg Oral Daily  . oseltamivir  75 mg Oral BID  . pantoprazole  20 mg Oral Daily  . sodium chloride  3 mL Intravenous Q12H   Continuous Infusions:   Active Problems:   DIABETES MELLITUS, TYPE  II   HYPERTENSION   LIVER FUNCTION TESTS, ABNORMAL, HX OF   Acute encephalopathy   UTI (lower urinary tract infection)   Neuropathy   HTN (hypertension), malignant   Parkinson disease   Febrile illness, acute    Time spent: 25 minutes    [facility] Pager [phone]. If 7PM-7AM, please contact night-coverage at [email or web address], password [facility] 02/05/2013, 1:23 PM  LOS: 1 day

## 2013-02-05 NOTE — Telephone Encounter (Signed)
Sent letter to patient from Dr. Magrinat. °

## 2013-02-05 NOTE — Telephone Encounter (Signed)
Return the phone call from the [facility]. sw Lynn gv appt for 04/02/13 w/labs@ 11:30am and ov@ 12 noon. Jeani Hawking is aware of the patients appts...td

## 2013-02-06 DIAGNOSIS — K838 Other specified diseases of biliary tract: Secondary | ICD-10-CM

## 2013-02-06 DIAGNOSIS — J069 Acute upper respiratory infection, unspecified: Secondary | ICD-10-CM

## 2013-02-06 DIAGNOSIS — J209 Acute bronchitis, unspecified: Secondary | ICD-10-CM

## 2013-02-06 DIAGNOSIS — R7989 Other specified abnormal findings of blood chemistry: Secondary | ICD-10-CM

## 2013-02-06 LAB — URINE CULTURE
Colony Count: NO GROWTH
Culture: NO GROWTH

## 2013-02-06 LAB — GLUCOSE, CAPILLARY
Glucose-Capillary: 94 mg/dL (ref 70–99)
Glucose-Capillary: 113 mg/dL — ABNORMAL HIGH (ref 70–99)

## 2013-02-06 MED ORDER — FLUTICASONE PROPIONATE 50 MCG/ACT NA SUSP: 2.0000 | Freq: Every day | NASAL | Status: AC | PRN

## 2013-02-06 MED ORDER — BISACODYL 10 MG RE SUPP
10.0000 mg | Freq: Once | RECTAL | Status: DC
Start: 2013-02-06 — End: 2013-02-06

## 2013-02-06 MED ORDER — HYDROCODONE-ACETAMINOPHEN 5-325 MG PO TABS: 2.0000 | ORAL_TABLET | Freq: Four times a day (QID) | ORAL | Status: DC | PRN

## 2013-02-06 MED ORDER — TEMAZEPAM 15 MG PO CAPS: 15.0000 mg | ORAL_CAPSULE | Freq: Every evening | ORAL | Status: DC | PRN

## 2013-02-06 MED ORDER — IPRATROPIUM-ALBUTEROL 0.5-2.5 (3) MG/3ML IN SOLN: 3.0000 mL | Freq: Four times a day (QID) | RESPIRATORY_TRACT | Status: AC | PRN

## 2013-02-06 MED ORDER — HYDROXYZINE HCL 10 MG PO TABS: 10.0000 mg | ORAL_TABLET | Freq: Three times a day (TID) | ORAL | Status: DC | PRN

## 2013-02-06 MED ORDER — CARBIDOPA-LEVODOPA 25-100 MG PO TABS: 1.0000 | ORAL_TABLET | Freq: Three times a day (TID) | ORAL | Status: DC

## 2013-02-06 MED ORDER — METRONIDAZOLE 500 MG PO TABS: 500.0000 mg | ORAL_TABLET | Freq: Three times a day (TID) | ORAL | Status: DC

## 2013-02-06 MED ORDER — CEFUROXIME AXETIL 500 MG PO TABS: 500.0000 mg | ORAL_TABLET | Freq: Two times a day (BID) | ORAL | Status: DC

## 2013-02-06 MED ORDER — DSS 100 MG PO CAPS: 100.0000 mg | ORAL_CAPSULE | Freq: Two times a day (BID) | ORAL | Status: AC

## 2013-02-06 MED ORDER — BISACODYL 10 MG RE SUPP: 10.0000 mg | Freq: Every day | RECTAL | Status: DC | PRN

## 2013-02-06 NOTE — Clinical Social Work Psychosocial (Signed)
Clinical Social Work Department BRIEF PSYCHOSOCIAL ASSESSMENT 02/06/2013  Patient:  Katherine Carr, Katherine Carr     Account Number:  [id number]     Admit date:  02/04/2013  Clinical Social Worker:  Lovey Newcomer  Date/Time:  02/06/2013 10:40 AM  Referred by:  Physician  Date Referred:  02/06/2013 Referred for  SNF Placement   Other Referral:   Interview type:  Family Other interview type:   Patient's son was interviewed over phone as patient is not completely oriented at this time.    PSYCHOSOCIAL DATA Living Status:  FACILITY Admitted from facility:  [facility] Level of care:  [place] Primary support name:  Katherine Carr Primary support relationship to patient:  CHILD, ADULT Degree of support available:   Support is good. Patient has friend Katherine Carr and a sister named Katherine Carr.    CURRENT CONCERNS Current Concerns  Post-Acute Placement   Other Concerns:    SOCIAL WORK ASSESSMENT / PLAN CSW met with patient at bedside. Patient was able to communicate with CSW that she is from [facility] and plans to return. CSW asked patient if son could be contact. Patient agreed. Son states that patient has lived at [facility] for about 3 years. Son Katherine Carr) states that he lives in [place], but patient has a sister and good friend here in [place] for support. CSW will assist with DC back to [facility].   Assessment/plan status:  Psychosocial Support/Ongoing Assessment of Needs Other assessment/ plan:   Complete FL2, Fax   Information/referral to community resources:   CSW contact information given to son.    PATIENT'S/FAMILY'S RESPONSE TO PLAN OF CARE: Patient and son are agreeable to patient returning to [facility]. Patient and son were pleasant, appropriate, and appreciative of CSW contact. CSW will assist with DC.       Liz Beach, [place], [place], [phone]

## 2013-02-06 NOTE — Progress Notes (Signed)
Pt. Had a short run of a fib this am then converted back to NSR.  Also was ST this am rate of 128. VSS - Blood pressure 134/78, pulse 74, temperature 98.5 F (36.9 C), temperature source Oral, resp. rate 16, height 5\' 4"  (1.626 m), weight 88.179 kg (194 lb 6.4 oz), SpO2 99.00%., O2   @ 2 L.  Dr. Hartford Poli informed, no new orders at present.  Will continue to monitor.  Alphonzo Lemmings, RN

## 2013-02-06 NOTE — Discharge Summary (Signed)
Physician Discharge Summary  Katherine Carr HID:[id number] DOB: 05/08/1940 DOA: 02/04/2013  PCP: Georgann Housekeeper, MD  Admit date: 02/04/2013 Discharge date: 02/06/2013  Time spent: 60 minutes  Recommendations for Outpatient Follow-up:  Ensure patient has regular bowel movements.  Appears to have chronic problems with constipation DM uncontrolled.  Monitor cbg and insulin dosage. Will be on ceftin / flagyl x 5 more days.  For UTI and possible bacterial translocation in her gut. Monitor for HTN.   Discharge Diagnoses:  Active Problems:   DIABETES MELLITUS, TYPE II   HYPERTENSION   LIVER FUNCTION TESTS, ABNORMAL, HX OF   Acute encephalopathy   UTI (lower urinary tract infection)   Neuropathy   HTN (hypertension), malignant   Parkinson disease   Febrile illness, acute   Discharge Condition: stable.    Diet recommendation: carb modified  Filed Weights   02/05/13 0227  Weight: 88.179 kg (194 lb 6.4 oz)    History of present illness:  73 yo female with PMH of DM, HTN, Parkinsons, Peripheral neuropathy, COPD and Breast Cancer.  Presented from [facility] with fever, elevated BP,  and decreased responsiveness.    Hospital Course:   Acute encephalopathy with febrile illness CT head was negative for acute changes. Patient improved with IVF.  Is now alert and orientated - but does have a flat affect. Etiology: Uncertain. Malignant HTN in combination with residual UTI vs  bacterial translocation from her gut.  Consequently she will be discharged on flagyl as well as Ceftin.  UTI Treated with keflex prior to admission. Treated with Rocephin inpatient. Culture showed no growth. Will finish antibiotic course with ceftin.  DM - uncontrolled Hgb A1C is 8.5 She is on a large dose of insulin  (approx 500 units per day). CBGs area now more controlled. May consider referral to endocrinology as an outpatient.  HTN - Malignant Elevated prior to admission. Now well controlled on  lisinopril and metoprolol.  Constipation There was concern that febrile illness could have been bacterial translocation from her gut.  Consequently she will be discharged on flagyl as well as Ceftin. Have instituted a bowel regimen of stool softeners and suppositories. Would monitor bowel movements - to ensure constipation doesn't recur.  Parkinson Disease Continue Sinemet   Neuropathy Continue neuropathic medications.   Discharge Exam: Filed Vitals:   02/06/13 1019  BP: 137/70  Pulse:   Temp:   Resp:    General: Alert and oriented x2, no acute distress.  Lying in bed.  Flat affect but pleasant.  Cardiovascular: Regular rate and rhythm, S1-S2  Respiratory: Clear to auscultation bilaterally, decreased breath sounds throughout  Abdomen: Soft, massive, distended, nontender, + bowel sounds  Musculoskeletal: No clubbing or cyanosis, trace edema     Discharge Instructions       Future Appointments Provider Department Dept Phone   02/11/2013 3:30 PM Adam Gus Rankin, DO [facility] [phone]   04/02/2013 11:30 AM [facility] [phone]   04/02/2013 12:00 PM Lowella Dell, MD [facility] [phone]       Medication List    STOP taking these medications       ROCEPHIN 1 G injection  Generic drug:  cefTRIAXone      TAKE these medications       alum & mag hydroxide-simeth 200-200-20 MG/5ML suspension  Commonly known as:  MAALOX/MYLANTA  Take 30 mLs by mouth every 4 (four) hours as needed for indigestion.  anastrozole 1 MG tablet  Commonly known as:  ARIMIDEX  Take 1 mg by mouth daily.     ARIPiprazole 5 MG tablet  Commonly known as:  ABILIFY  Take 5 mg by mouth at bedtime. *takes with 2mg  tablet for 7mg  dose     ARIPiprazole 2 MG tablet  Commonly known as:  ABILIFY  Take 2 mg by mouth daily. *takes with 5mg  tablet for 7mg  dose     bisacodyl 10 MG  suppository  Commonly known as:  DULCOLAX  Place 1 suppository (10 mg total) rectally daily as needed for moderate constipation.     carbidopa-levodopa 25-100 MG per tablet  Commonly known as:  SINEMET  Take 1 tablet by mouth 3 (three) times daily.     cefUROXime 500 MG tablet  Commonly known as:  CEFTIN  Take 1 tablet (500 mg total) by mouth 2 (two) times daily with a meal.     cetirizine 5 MG tablet  Commonly known as:  ZYRTEC  Take 5 mg by mouth daily as needed for allergies.     diclofenac sodium 1 % Gel  Commonly known as:  VOLTAREN  Apply 4 g topically 3 (three) times daily as needed (joint pain).     DSS 100 MG Caps  Take 100 mg by mouth 2 (two) times daily.     DULoxetine 60 MG capsule  Commonly known as:  CYMBALTA  Take 60 mg by mouth 2 (two) times daily.     fluticasone 50 MCG/ACT nasal spray  Commonly known as:  FLONASE  Place 2 sprays into both nostrils daily as needed for rhinitis or allergies.     gabapentin 600 MG tablet  Commonly known as:  NEURONTIN  Take 600 mg by mouth 3 (three) times daily.     HYDROcodone-acetaminophen 5-325 MG per tablet  Commonly known as:  NORCO/VICODIN  Take 2 tablets by mouth every 6 (six) hours as needed.     hydrOXYzine 10 MG tablet  Commonly known as:  ATARAX/VISTARIL  Take 1 tablet (10 mg total) by mouth every 8 (eight) hours as needed for itching.     insulin glargine 100 UNIT/ML injection  Commonly known as:  LANTUS  Inject 100 Units into the skin 2 (two) times daily.     insulin lispro 100 UNIT/ML injection  Commonly known as:  HUMALOG  Inject 100 Units into the skin 3 (three) times daily before meals.     ipratropium-albuterol 0.5-2.5 (3) MG/3ML Soln  Commonly known as:  DUONEB  Take 3 mLs by nebulization every 6 (six) hours as needed (shortness of breath).     lansoprazole 30 MG capsule  Commonly known as:  PREVACID  Take 30 mg by mouth daily.     lisinopril 2.5 MG tablet  Commonly known as:   PRINIVIL,ZESTRIL  Take 2.5 mg by mouth daily.     Melatonin 3 MG Tabs  Take 3 mg by mouth at bedtime.     methylcellulose 1 % ophthalmic solution  Commonly known as:  ARTIFICIAL TEARS  Place 2 drops into both eyes 2 (two) times daily as needed (dry eyes).     metoprolol succinate 25 MG 24 hr tablet  Commonly known as:  TOPROL-XL  Take 25 mg by mouth daily.     metroNIDAZOLE 500 MG tablet  Commonly known as:  FLAGYL  Take 1 tablet (500 mg total) by mouth 3 (three) times daily.     ondansetron 4 MG tablet  Commonly known  as:  ZOFRAN  Take 4 mg by mouth every 8 (eight) hours as needed for nausea or vomiting.     pravastatin 40 MG tablet  Commonly known as:  PRAVACHOL  Take 40 mg by mouth daily.     SENNA S PO  Take 1 tablet by mouth 2 (two) times daily.     temazepam 15 MG capsule  Commonly known as:  RESTORIL  Take 1 capsule (15 mg total) by mouth at bedtime as needed (for insomnia).     TYLENOL 325 MG tablet  Generic drug:  acetaminophen  Take 650 mg by mouth every 4 (four) hours as needed for moderate pain.     Vitamin D3 1000 UNITS Caps  Take 2,000 Units by mouth daily.       Allergies  Allergen Reactions  . Sertraline Hcl Hives  . Codeine Hives and Swelling  . Cyclobenzaprine Hcl Hives and Swelling  . Fentanyl     REACTION: PANIC ATTACKS  . Furosemide     REACTION: face was swollen and hand  . Gadolinium Derivatives Swelling  . Hydrocodone-Acetaminophen   . Iohexol      Code: SOB, Desc: xray dye, Onset Date: [id number]   . Levofloxacin     REACTION: PANIC ATTACK,DIARRHEA,EYES DISCOLORD  . Lorazepam Nausea And Vomiting  . Lubiprostone     REACTION: REALLY BAD NAUSEA  . Metformin Hives and Swelling  . Metoclopramide Nausea And Vomiting and Swelling  . Morphine     REACTION: itching, panic attacks  . Oxycodone Hives and Swelling  . Promethazine Hcl     REACTION: TRAMA ANDJITTERS IN MY BODY  . Rosiglitazone Hives and Swelling  . Sertraline Hcl Hives  and Swelling  . Verapamil Hives and Nausea And Vomiting      The results of significant diagnostics from this hospitalization (including imaging, microbiology, ancillary and laboratory) are listed below for reference.    Significant Diagnostic Studies: Ct Head Wo Contrast  02/05/2013   CLINICAL DATA:  Altered mental status, sepsis  EXAM: CT HEAD WITHOUT CONTRAST  TECHNIQUE: Contiguous axial images were obtained from the base of the skull through the vertex without intravenous contrast.  COMPARISON:  Prior MRI from 10/28/2012  FINDINGS: Mild cerebral atrophy with chronic microvascular ischemic changes again noted, unchanged.  There is no acute intracranial hemorrhage or infarct. No mass lesion or midline shift. Gray-white matter differentiation is well maintained. Ventricles are normal in size without evidence of hydrocephalus. No extra-axial fluid collection.  The calvarium is intact.  Orbital soft tissues are within normal limits.  The paranasal sinuses and mastoid air cells are well pneumatized and free of fluid.  Scalp soft tissues are unremarkable.  IMPRESSION: 1. No acute intracranial abnormality. 2. Mild age-related atrophy and chronic microvascular ischemic changes.   Electronically Signed   By: Jeannine Boga M.D.   On: 02/05/2013 06:05   Dg Chest Port 1 View  02/04/2013   CLINICAL DATA:  Fever and altered mental status.  EXAM: PORTABLE CHEST - 1 VIEW  COMPARISON:  PA and lateral chest 10/28/2012.  FINDINGS: The patient is slightly rotated on the study. Lungs are clear. Heart size is upper normal. No pneumothorax or pleural effusion.  IMPRESSION: No acute disease.   Electronically Signed   By: Inge Rise M.D.   On: 02/04/2013 23:16   Dg Abd Portable 1v  02/05/2013   CLINICAL DATA:  Fever.  Pain.  EXAM: PORTABLE ABDOMEN - 1 VIEW  COMPARISON:  CT 11/13/2009.  FINDINGS:  Stomach is moderately distended. Remainder of the abdominal gas pattern is nonspecific. Stool is noted within colon.  No small or large bowel distention. No free air identified on this portable supine view. Degenerative changes lumbar spine.  IMPRESSION: Moderate gastric distention.   Electronically Signed   By: Marcello Moores  Register   On: 02/05/2013 14:45    Microbiology: Recent Results (from the past 240 hour(s))  CULTURE, BLOOD (ROUTINE X 2)     Status: None   Collection Time    02/04/13 10:35 PM      Result Value Range Status   Specimen Description BLOOD HAND LEFT   Final   Special Requests BOTTLES DRAWN AEROBIC AND ANAEROBIC 5CC   Final   Culture  Setup Time     Final   Value: 02/05/2013 08:44     Performed at [facility]   Culture     Final   Value:        BLOOD CULTURE RECEIVED NO GROWTH TO DATE CULTURE WILL BE HELD FOR 5 DAYS BEFORE ISSUING A FINAL NEGATIVE REPORT     Performed at [facility]   Report Status PENDING   Incomplete  CULTURE, BLOOD (ROUTINE X 2)     Status: None   Collection Time    02/04/13 10:40 PM      Result Value Range Status   Specimen Description BLOOD HAND RIGHT   Final   Special Requests BOTTLES DRAWN AEROBIC ONLY 5CC   Final   Culture  Setup Time     Final   Value: 02/05/2013 08:45     Performed at [facility]   Culture     Final   Value:        BLOOD CULTURE RECEIVED NO GROWTH TO DATE CULTURE WILL BE HELD FOR 5 DAYS BEFORE ISSUING A FINAL NEGATIVE REPORT     Performed at [facility]   Report Status PENDING   Incomplete  URINE CULTURE     Status: None   Collection Time    02/04/13 11:09 PM      Result Value Range Status   Specimen Description URINE, CATHETERIZED   Final   Special Requests NONE   Final   Culture  Setup Time     Final   Value: 02/05/2013 08:41     Performed at [facility] Count     Final   Value: NO GROWTH     Performed at [facility]   Culture     Final   Value: NO GROWTH     Performed at [facility]   Report Status 02/06/2013 FINAL   Final  MRSA PCR SCREENING     Status:  None   Collection Time    02/05/13  2:22 AM      Result Value Range Status   MRSA by PCR NEGATIVE  NEGATIVE Final   Comment:            The GeneXpert MRSA Assay (FDA     approved for NASAL specimens     only), is one component of a     comprehensive MRSA colonization     surveillance program. It is not     intended to diagnose MRSA     infection nor to guide or     monitor treatment for     MRSA infections.     Labs: Basic Metabolic Panel:  Recent Labs Lab 02/04/13 2241 02/05/13 0716  NA  140 143  K 3.6* 3.7  CL 100 103  CO2 25 27  GLUCOSE 178* 99  BUN 35* 32*  CREATININE 0.65 0.63  CALCIUM 8.7 8.4  MG  --  2.3  PHOS  --  3.6   Liver Function Tests:  Recent Labs Lab 02/04/13 2241 02/05/13 0716  AST 45* 66*  ALT 20 20  ALKPHOS 157* 196*  BILITOT 0.5 0.9  PROT 6.3 6.0  ALBUMIN 2.6* 2.5*    Recent Labs Lab 02/04/13 2253  AMMONIA 34   CBC:  Recent Labs Lab 02/04/13 2241 02/05/13 0716  WBC 6.1 5.9  NEUTROABS 4.5  --   HGB 13.5 13.3  HCT 37.6 37.5  MCV 89.5 89.7  PLT 158 159   Cardiac Enzymes:  Recent Labs Lab 02/05/13 0716 02/05/13 0855 02/05/13 1515  TROPONINI <0.30 <0.30 <0.30   CBG:  Recent Labs Lab 02/05/13 0901 02/05/13 1257 02/05/13 1724 02/05/13 2101 02/06/13 0751  GLUCAP 83 130* 167* 159* [address]       Signed:  Karen Kitchens [phone]  [facility] 02/06/2013, 11:18 AM

## 2013-02-06 NOTE — Progress Notes (Signed)
Nsg Discharge Note  Admit Date:  02/04/2013 Discharge date: 03/03/3555   Katherine Carr to be D/C'd Skilled nursing facility per MD order.  AVS completed.  Copy for chart, and copy for patient signed, and dated. Patient/caregiver able to verbalize understanding.  Discharge Medication:   Medication List    STOP taking these medications       ROCEPHIN 1 G injection  Generic drug:  cefTRIAXone      TAKE these medications       alum & mag hydroxide-simeth 200-200-20 MG/5ML suspension  Commonly known as:  MAALOX/MYLANTA  Take 30 mLs by mouth every 4 (four) hours as needed for indigestion.     anastrozole 1 MG tablet  Commonly known as:  ARIMIDEX  Take 1 mg by mouth daily.     ARIPiprazole 5 MG tablet  Commonly known as:  ABILIFY  Take 5 mg by mouth at bedtime. *takes with 2mg  tablet for 7mg  dose     ARIPiprazole 2 MG tablet  Commonly known as:  ABILIFY  Take 2 mg by mouth daily. *takes with 5mg  tablet for 7mg  dose     bisacodyl 10 MG suppository  Commonly known as:  DULCOLAX  Place 1 suppository (10 mg total) rectally daily as needed for moderate constipation.     carbidopa-levodopa 25-100 MG per tablet  Commonly known as:  SINEMET  Take 1 tablet by mouth 3 (three) times daily.     cefUROXime 500 MG tablet  Commonly known as:  CEFTIN  Take 1 tablet (500 mg total) by mouth 2 (two) times daily with a meal.     cetirizine 5 MG tablet  Commonly known as:  ZYRTEC  Take 5 mg by mouth daily as needed for allergies.     diclofenac sodium 1 % Gel  Commonly known as:  VOLTAREN  Apply 4 g topically 3 (three) times daily as needed (joint pain).     DSS 100 MG Caps  Take 100 mg by mouth 2 (two) times daily.     DULoxetine 60 MG capsule  Commonly known as:  CYMBALTA  Take 60 mg by mouth 2 (two) times daily.     fluticasone 50 MCG/ACT nasal spray  Commonly known as:  FLONASE  Place 2 sprays into both nostrils daily as needed for rhinitis or allergies.     gabapentin 600  MG tablet  Commonly known as:  NEURONTIN  Take 600 mg by mouth 3 (three) times daily.     HYDROcodone-acetaminophen 5-325 MG per tablet  Commonly known as:  NORCO/VICODIN  Take 2 tablets by mouth every 6 (six) hours as needed.     hydrOXYzine 10 MG tablet  Commonly known as:  ATARAX/VISTARIL  Take 1 tablet (10 mg total) by mouth every 8 (eight) hours as needed for itching.     insulin glargine 100 UNIT/ML injection  Commonly known as:  LANTUS  Inject 100 Units into the skin 2 (two) times daily.     insulin lispro 100 UNIT/ML injection  Commonly known as:  HUMALOG  Inject 100 Units into the skin 3 (three) times daily before meals.     ipratropium-albuterol 0.5-2.5 (3) MG/3ML Soln  Commonly known as:  DUONEB  Take 3 mLs by nebulization every 6 (six) hours as needed (shortness of breath).     lansoprazole 30 MG capsule  Commonly known as:  PREVACID  Take 30 mg by mouth daily.     lisinopril 2.5 MG tablet  Commonly known as:  PRINIVIL,ZESTRIL  Take 2.5 mg by mouth daily.     Melatonin 3 MG Tabs  Take 3 mg by mouth at bedtime.     methylcellulose 1 % ophthalmic solution  Commonly known as:  ARTIFICIAL TEARS  Place 2 drops into both eyes 2 (two) times daily as needed (dry eyes).     metoprolol succinate 25 MG 24 hr tablet  Commonly known as:  TOPROL-XL  Take 25 mg by mouth daily.     metroNIDAZOLE 500 MG tablet  Commonly known as:  FLAGYL  Take 1 tablet (500 mg total) by mouth 3 (three) times daily.     ondansetron 4 MG tablet  Commonly known as:  ZOFRAN  Take 4 mg by mouth every 8 (eight) hours as needed for nausea or vomiting.     pravastatin 40 MG tablet  Commonly known as:  PRAVACHOL  Take 40 mg by mouth daily.     SENNA S PO  Take 1 tablet by mouth 2 (two) times daily.     temazepam 15 MG capsule  Commonly known as:  RESTORIL  Take 1 capsule (15 mg total) by mouth at bedtime as needed (for insomnia).     TYLENOL 325 MG tablet  Generic drug:  acetaminophen   Take 650 mg by mouth every 4 (four) hours as needed for moderate pain.     Vitamin D3 1000 UNITS Caps  Take 2,000 Units by mouth daily.        Discharge Assessment: Filed Vitals:   02/06/13 1436  BP: 105/65  Pulse: 88  Temp: 97.8 F (36.6 C)  Resp: 18   Skin clean, dry and intact without evidence of skin break down, no evidence of skin tears noted. IV catheter discontinued intact. Site without signs and symptoms of complications - no redness or edema noted at insertion site, patient denies c/o pain - only slight tenderness at site.  Dressing with slight pressure applied.  D/c Instructions-Education: Discharge instructions given to patient/family with verbalized understanding. D/c education completed with patient/family including follow up instructions, medication list, d/c activities limitations if indicated, with other d/c instructions as indicated by MD - patient able to verbalize understanding, all questions fully answered. Patient instructed to return to ED, call 911, or call MD for any changes in condition.  Patient escorted via [place], and D/C home via private auto.  Aishia Barkey, Elissa Hefty, RN 02/06/2013 5:41 PM

## 2013-02-06 NOTE — Discharge Summary (Addendum)
Addendum  Patient seen and examined, chart and data base reviewed.  I agree with the above assessment and plan.  For full details please see Mrs. Imogene Burn PA note.  Febrile illness and acute encephalopathy, unclear etiology but could be secondary to residual UTI versus viral infection.  Diabetes difficult to control, patient is on 500 units of insulin per day, continue checking CBGs.  Constipation, appears to be chronic, patient started on bowel regimen.  If patient presented again with fever, hold consider CT scan of abdomen to rule out biliary or other intra-abdominal infections.   Birdie Hopes, MD [facility] Pager: [phone] 02/06/2013, 12:07 PM

## 2013-02-11 ENCOUNTER — Ambulatory Visit: Payer: Self-pay | Admitting: Neurology

## 2013-02-11 LAB — CULTURE, BLOOD (ROUTINE X 2)
CULTURE: NO GROWTH
Culture: NO GROWTH

## 2013-02-19 ENCOUNTER — Ambulatory Visit: Payer: Self-pay | Admitting: Oncology

## 2013-02-19 ENCOUNTER — Other Ambulatory Visit: Payer: Self-pay

## 2013-02-20 ENCOUNTER — Encounter: Payer: Self-pay | Admitting: Neurology

## 2013-02-20 ENCOUNTER — Ambulatory Visit (INDEPENDENT_AMBULATORY_CARE_PROVIDER_SITE_OTHER): Payer: PRIVATE HEALTH INSURANCE | Admitting: Neurology

## 2013-02-20 VITALS — BP 132/76 | HR 74 | Temp 97.7°F | Resp 20 | Ht 64.0 in | Wt 193.0 lb

## 2013-02-20 DIAGNOSIS — R4789 Other speech disturbances: Secondary | ICD-10-CM

## 2013-02-20 DIAGNOSIS — G20 Parkinson's disease: Secondary | ICD-10-CM

## 2013-02-20 DIAGNOSIS — E1142 Type 2 diabetes mellitus with diabetic polyneuropathy: Secondary | ICD-10-CM

## 2013-02-20 DIAGNOSIS — R4781 Slurred speech: Secondary | ICD-10-CM

## 2013-02-20 DIAGNOSIS — E1149 Type 2 diabetes mellitus with other diabetic neurological complication: Secondary | ICD-10-CM

## 2013-02-20 DIAGNOSIS — G20A1 Parkinson's disease without dyskinesia, without mention of fluctuations: Secondary | ICD-10-CM

## 2013-02-20 DIAGNOSIS — G3184 Mild cognitive impairment, so stated: Secondary | ICD-10-CM

## 2013-02-20 DIAGNOSIS — E1140 Type 2 diabetes mellitus with diabetic neuropathy, unspecified: Secondary | ICD-10-CM

## 2013-02-20 NOTE — Progress Notes (Signed)
NEUROLOGY FOLLOW UP OFFICE NOTE  Katherine Carr [id number]  HISTORY OF PRESENT ILLNESS: Katherine Carr is a 73 year old right-handed woman from nursing home with history of depression, diabetes, hyperlipidemia and hypertension who follows up for Parkinson's disease with mild cognitive impairment.  Records and images were personally reviewed where available.    UPDATE: Last visit, she was restarted on Sinemet, titrated to 25/100mg  three times daily.  She hasn't really noticed much difference but she doesn't seem to be exhibiting tremor.  She was recently admitted to the hospital earlier this month for altered mental status, thought to be secondary to UTI and malignant hypertension.  She had a fever of 101 and blood pressure was 200/80.  CT of head was unremarkable.  She also has since had a couple of episodes of transient slurred speech but did not go to the hospital.  She also notes increased pain in the arms as well as increased weakness in the left arm.  HISTORY: She was admitted to the hospital from 10/28/12 to 10/30/12 for stroke like symptoms, presenting as confusing and leaning towards the left.  CT head revealed no acute abnormalities.  MRI of brain was unremarkable.  She was found to have a UTI.  LFTs were initially elevated and thought to be secondary to CBD.  They later normalized.  Neurological exam revealed cogwheel rigidity and pill-rolling resting tremor in left hand.  She also endorsed memory problems and visual hallucinations.    She has been wheelchair-bound for two years, she says due to diabetic neuropathy.  She fell multiple times.  She also has history of cervical stenosis as well as "lumbar problems".  She notes occasional tremor in her left hand and difficulty sometimes using the hand.  She notes feeling of lightheadedness and spinning if she sits up from bed.  She denies difficulty swallowing.  She has right arm pain stemming from her shoulder.  No numbness in the right  arm.  She does report history of vivid dreams but denies anyone telling her she has symptoms consistent with REM sleep behavior disorder.  She denies memory problems.  She notes that her father had tremors.  PAST MEDICAL HISTORY: Past Medical History  Diagnosis Date  . Depression   . Diabetes mellitus   . Hypertension   . Peripheral neuropathy   . Diverticulosis   . Urinary incontinence   . Herpes   . Ovarian cyst   . Fibrous breast lumps   . Wears glasses   . Fibromyalgia   . GERD (gastroesophageal reflux disease)   . COPD (chronic obstructive pulmonary disease)   . Asthma     bronchial  . Fatty liver     hx: of  . Anxiety   . Left carotid stenosis     Hx: of  . Breast cancer     "both sides" (10/29/2012)  . Osteoarthritis     Archie Endo 09/12/2007  (10/29/2012)  . H/O hiatal hernia     Archie Endo 09/12/2007  (10/29/2012)  . Migraines     Archie Endo 09/01/2001 (10/29/2012)    MEDICATIONS: Current Outpatient Prescriptions on File Prior to Visit  Medication Sig Dispense Refill  . acetaminophen (TYLENOL) 325 MG tablet Take 650 mg by mouth every 4 (four) hours as needed for moderate pain.      Marland Kitchen alum & mag hydroxide-simeth (MAALOX/MYLANTA) 200-200-20 MG/5ML suspension Take 30 mLs by mouth every 4 (four) hours as needed for indigestion.      Marland Kitchen anastrozole (ARIMIDEX) 1  MG tablet Take 1 mg by mouth daily.      . ARIPiprazole (ABILIFY) 2 MG tablet Take 2 mg by mouth daily. *takes with 5mg  tablet for 7mg  dose      . ARIPiprazole (ABILIFY) 5 MG tablet Take 5 mg by mouth at bedtime. *takes with 2mg  tablet for 7mg  dose      . bisacodyl (DULCOLAX) 10 MG suppository Place 1 suppository (10 mg total) rectally daily as needed for moderate constipation.  12 suppository  0  . carbidopa-levodopa (SINEMET) 25-100 MG per tablet Take 1 tablet by mouth 3 (three) times daily.      . cefUROXime (CEFTIN) 500 MG tablet Take 1 tablet (500 mg total) by mouth 2 (two) times daily with a meal.  10 tablet  0  .  cetirizine (ZYRTEC) 5 MG tablet Take 5 mg by mouth daily as needed for allergies.       . Cholecalciferol (VITAMIN D3) 1000 UNITS CAPS Take 2,000 Units by mouth daily.      . diclofenac sodium (VOLTAREN) 1 % GEL Apply 4 g topically 3 (three) times daily as needed (joint pain).      Marland Kitchen docusate sodium 100 MG CAPS Take 100 mg by mouth 2 (two) times daily.  10 capsule  0  . DULoxetine (CYMBALTA) 60 MG capsule Take 60 mg by mouth 2 (two) times daily.      . fluticasone (FLONASE) 50 MCG/ACT nasal spray Place 2 sprays into both nostrils daily as needed for rhinitis or allergies.    2  . gabapentin (NEURONTIN) 600 MG tablet Take 600 mg by mouth 3 (three) times daily.        Marland Kitchen HYDROcodone-acetaminophen (NORCO/VICODIN) 5-325 MG per tablet Take 2 tablets by mouth every 6 (six) hours as needed.  10 tablet  0  . hydrOXYzine (ATARAX/VISTARIL) 10 MG tablet Take 1 tablet (10 mg total) by mouth every 8 (eight) hours as needed for itching.  30 tablet  0  . insulin glargine (LANTUS) 100 UNIT/ML injection Inject 100 Units into the skin 2 (two) times daily.      . insulin lispro (HUMALOG) 100 UNIT/ML injection Inject 100 Units into the skin 3 (three) times daily before meals.      Marland Kitchen ipratropium-albuterol (DUONEB) 0.5-2.5 (3) MG/3ML SOLN Take 3 mLs by nebulization every 6 (six) hours as needed (shortness of breath).  360 mL  2  . lansoprazole (PREVACID) 30 MG capsule Take 30 mg by mouth daily.      Marland Kitchen lisinopril (PRINIVIL,ZESTRIL) 2.5 MG tablet Take 2.5 mg by mouth daily.       . Melatonin 3 MG TABS Take 3 mg by mouth at bedtime.      . methylcellulose (ARTIFICIAL TEARS) 1 % ophthalmic solution Place 2 drops into both eyes 2 (two) times daily as needed (dry eyes).      . metoprolol succinate (TOPROL-XL) 25 MG 24 hr tablet Take 25 mg by mouth daily.        . metroNIDAZOLE (FLAGYL) 500 MG tablet Take 1 tablet (500 mg total) by mouth 3 (three) times daily.  15 tablet  0  . ondansetron (ZOFRAN) 4 MG tablet Take 4 mg by mouth  every 8 (eight) hours as needed for nausea or vomiting.      . pravastatin (PRAVACHOL) 40 MG tablet Take 40 mg by mouth daily.      Orlie Dakin Sodium (SENNA S PO) Take 1 tablet by mouth 2 (two) times daily.       Marland Kitchen  temazepam (RESTORIL) 15 MG capsule Take 1 capsule (15 mg total) by mouth at bedtime as needed (for insomnia).  30 capsule  0  . [DISCONTINUED] ClonazePAM (KLONOPIN PO) Take 0.2 mg by mouth 2 (two) times daily.        . [DISCONTINUED] escitalopram (LEXAPRO) 20 MG tablet Take 10 mg by mouth. Take 3 tablets in the AM      . [DISCONTINUED] esomeprazole (NEXIUM) 40 MG capsule Take 40 mg by mouth daily before breakfast.        . [DISCONTINUED] Insulin Regular Human (HUMULIN R U-500, CONCENTRATED, [place]) Inject into the skin 3 (three) times daily. Take three times / day prn, per SS Insulin coverage       No current facility-administered medications on file prior to visit.    ALLERGIES: Allergies  Allergen Reactions  . Sertraline Hcl Hives  . Codeine Hives and Swelling  . Cyclobenzaprine Hcl Hives and Swelling  . Fentanyl     REACTION: PANIC ATTACKS  . Furosemide     REACTION: face was swollen and hand  . Gadolinium Derivatives Swelling  . Hydrocodone-Acetaminophen   . Iohexol      Code: SOB, Desc: xray dye, Onset Date: [id number]   . Levofloxacin     REACTION: PANIC ATTACK,DIARRHEA,EYES DISCOLORD  . Lorazepam Nausea And Vomiting  . Lubiprostone     REACTION: REALLY BAD NAUSEA  . Metformin Hives and Swelling  . Metoclopramide Nausea And Vomiting and Swelling  . Morphine     REACTION: itching, panic attacks  . Oxycodone Hives and Swelling  . Promethazine Hcl     REACTION: TRAMA ANDJITTERS IN MY BODY  . Rosiglitazone Hives and Swelling  . Sertraline Hcl Hives and Swelling  . Verapamil Hives and Nausea And Vomiting    FAMILY HISTORY: Family History  Problem Relation Age of Onset  . Heart disease Mother   . Heart disease Brother   . Diabetes Brother   .  Arthritis Sister   . Stroke Sister   . Cirrhosis Brother   . Cancer Brother     Bladder cancer    SOCIAL HISTORY: History   Social History  . Marital Status: Divorced    Spouse Name: N/A    Number of Children: N/A  . Years of Education: N/A   Occupational History  . Not on file.   Social History Main Topics  . Smoking status: Former Smoker -- 20 years    Types: Cigarettes    Quit date: 08/15/1996  . Smokeless tobacco: Never Used     Comment: smoked 2-3 cigarettes/day  . Alcohol Use: No  . Drug Use: No  . Sexual Activity: No   Other Topics Concern  . Not on file   Social History Narrative  . No narrative on file    REVIEW OF SYSTEMS: Constitutional: No fevers, chills, or sweats, no generalized fatigue, change in appetite Eyes: No visual changes, double vision, eye pain Ear, nose and throat: No hearing loss, ear pain, nasal congestion, sore throat Cardiovascular: No chest pain, palpitations Respiratory:  No shortness of breath at rest or with exertion, wheezes GastrointestinaI: No nausea, vomiting, diarrhea, abdominal pain, fecal incontinence Genitourinary:  No dysuria, urinary retention or frequency Musculoskeletal:  No neck pain, back pain Integumentary: No rash, pruritus, skin lesions Neurological: as above Psychiatric: No depression, insomnia, anxiety Endocrine: No palpitations, fatigue, diaphoresis, mood swings, change in appetite, change in weight, increased thirst Hematologic/Lymphatic:  No anemia, purpura, petechiae. Allergic/Immunologic: no itchy/runny eyes, nasal congestion, recent  allergic reactions, rashes  PHYSICAL EXAM: Filed Vitals:   02/20/13 1010  BP: 132/76  Pulse: 74  Temp: 97.7 F (36.5 C)  Resp: 20   General: No acute distress Head:  Normocephalic/atraumatic Neck: supple, no paraspinal tenderness, full range of motion Heart:  Regular rate and rhythm Lungs:  Clear to auscultation bilaterally Back: No paraspinal  tenderness Neurological Exam: Flat affect.  Alert and oriented to person, place, and time. Speech slowed but fluent and not dysarthric, language intact.  Hypomimia.  CN II-XII intact. Bradykinetic, no tremor noted, muscle strength Left triceps 4/5, otherwise 5-/5 in upper extremities.  2/5 in LEs with left foot drop.  Reduced pinprick in feet and hands, reduced vibration in feet.  Deep tendon reflexes absent throughout, toes downgoing.  Finger to nose without tremor but unable to extend left elbow to touch my finger.  Wheelchair-bound.  IMPRESSION: 1.  Parkinson's disease, seems improved compared to last visit. 2.  Mild cognitive impairment 3.  Transient slurred speech.  Rule out stroke 4.  Diabetic neuropathy  PLAN: 1.  Will check MRI of brain and carotid doppler for stroke evaluation. 2.  If MRI brain unremarkable, consider imaging of the cervical spine given the arm pain and weakness. 3.  Continue Sinemet 25/100mg  TID 4.  Follow up   Katherine Clines, DO  CC:  Wenda Low, MD

## 2013-02-20 NOTE — Patient Instructions (Addendum)
1.  We will check MRI of the brain to look for evidence of stroke.Katherine Carr 03/09/13 11:45 open MRI  2.  We will check carotid doppler.03/09/13 @10am  section a [place] tower  They will take you over to the vascular . 3.  Further testing pending these results 4.  Follow up in 3 months. 5.  Continue Sinemet 1 tablet three times daily.

## 2013-03-09 ENCOUNTER — Other Ambulatory Visit: Payer: Self-pay | Admitting: *Deleted

## 2013-03-09 ENCOUNTER — Other Ambulatory Visit (HOSPITAL_COMMUNITY): Payer: Self-pay | Admitting: Neurology

## 2013-03-09 ENCOUNTER — Ambulatory Visit (HOSPITAL_COMMUNITY): Payer: Self-pay

## 2013-03-09 ENCOUNTER — Ambulatory Visit (HOSPITAL_COMMUNITY)
Admission: RE | Admit: 2013-03-09 | Discharge: 2013-03-09 | Disposition: A | Discharge status: Home / Self Care | Payer: PRIVATE HEALTH INSURANCE | Source: Ambulatory Visit | Attending: Neurology | Admitting: Neurology

## 2013-03-09 ENCOUNTER — Telehealth: Payer: Self-pay | Admitting: Neurology

## 2013-03-09 ENCOUNTER — Ambulatory Visit: Admission: RE | Admit: 2013-03-09 | Payer: Self-pay | Source: Ambulatory Visit

## 2013-03-09 DIAGNOSIS — R4789 Other speech disturbances: Secondary | ICD-10-CM

## 2013-03-09 DIAGNOSIS — E669 Obesity, unspecified: Secondary | ICD-10-CM | POA: Insufficient documentation

## 2013-03-09 DIAGNOSIS — T6701XA Heatstroke and sunstroke, initial encounter: Secondary | ICD-10-CM

## 2013-03-09 DIAGNOSIS — R1310 Dysphagia, unspecified: Secondary | ICD-10-CM | POA: Insufficient documentation

## 2013-03-09 DIAGNOSIS — R4781 Slurred speech: Secondary | ICD-10-CM

## 2013-03-09 DIAGNOSIS — G20A1 Parkinson's disease without dyskinesia, without mention of fluctuations: Secondary | ICD-10-CM | POA: Insufficient documentation

## 2013-03-09 DIAGNOSIS — R1319 Other dysphagia: Secondary | ICD-10-CM

## 2013-03-09 DIAGNOSIS — R5381 Other malaise: Secondary | ICD-10-CM | POA: Insufficient documentation

## 2013-03-09 DIAGNOSIS — R5383 Other fatigue: Secondary | ICD-10-CM

## 2013-03-09 DIAGNOSIS — G20 Parkinson's disease: Secondary | ICD-10-CM | POA: Insufficient documentation

## 2013-03-09 NOTE — Telephone Encounter (Signed)
Pt husband called and said that they had to cancel MRI due to the fact that the patient is in need of help getting on and off the table she is confined to a wheelchair so he has made appt to have  the MRI done at [facility] and they need a order faxed to them of what is to be done to [phone]. Please let them know she is confined to a wheelchair and will need help getting on and off table. If you need to speak to the patient or her husband you may call [phone]

## 2013-03-09 NOTE — Telephone Encounter (Signed)
MRI Brain without contrast was cancelled and rescheduled at [facility] due to special needs  And transportation

## 2013-03-09 NOTE — Progress Notes (Signed)
Bilateral carotid artery duplex:  1-39% ICA stenosis.  Vertebral artery flow is antegrade.     

## 2013-03-10 ENCOUNTER — Other Ambulatory Visit: Payer: Self-pay

## 2013-03-12 ENCOUNTER — Other Ambulatory Visit: Payer: Self-pay | Admitting: *Deleted

## 2013-03-12 DIAGNOSIS — I6529 Occlusion and stenosis of unspecified carotid artery: Secondary | ICD-10-CM

## 2013-03-13 ENCOUNTER — Other Ambulatory Visit: Payer: Self-pay | Admitting: *Deleted

## 2013-03-13 DIAGNOSIS — R51 Headache: Secondary | ICD-10-CM

## 2013-03-13 NOTE — Telephone Encounter (Signed)
This patient's nurse at [facility] is aware of CTA on 03/18/13 and labs  At [address] [facility]  NPO 4 hours prior

## 2013-03-16 ENCOUNTER — Telehealth: Payer: Self-pay | Admitting: *Deleted

## 2013-03-16 ENCOUNTER — Telehealth: Payer: Self-pay | Admitting: Neurology

## 2013-03-16 NOTE — Telephone Encounter (Signed)
I spoke with this patient nurse Wells Guiles they are aware that this patient is allergic to contrast  We have faxed over a pre medication  To be taken 13 hours prior to the test . [facility] radiology called to discuss this with [place]

## 2013-03-16 NOTE — Telephone Encounter (Signed)
This person is not listed as a contact for me to talk to no HIPPA release signed

## 2013-03-16 NOTE — Telephone Encounter (Signed)
Pt's spouse called requesting to speak to a nurse.

## 2013-03-17 ENCOUNTER — Telehealth: Payer: Self-pay | Admitting: Neurology

## 2013-03-17 NOTE — Telephone Encounter (Signed)
Pt spouse really needs to talk to someone please call [phone] he states that is very important that he talks with someone

## 2013-03-17 NOTE — Telephone Encounter (Signed)
I informed this patients spouse that I am not able to discuss the patient with him as he has not signed a consent form therefore it would be a HIPPA  Violation for me to discuss with him anything with him regarding her . I advised him if he has consent from her and she wishes that he have access to her medical history  That a consent form be signed

## 2013-03-17 NOTE — Telephone Encounter (Signed)
Please call husband back he has called again and inform him why no one is calling him back

## 2013-03-17 NOTE — Telephone Encounter (Signed)
Pt husband called and said to please call Afsheen at the nursing home at this number [phone]

## 2013-03-17 NOTE — Telephone Encounter (Signed)
I have called this patient several times and not able to reach the nurse or the patient  [phone] is not her correct number

## 2013-03-17 NOTE — Telephone Encounter (Signed)
Pt husband has called back again today and states that someone needs to call him back or he is cancelling the cat scan [phone]

## 2013-03-18 ENCOUNTER — Telehealth: Payer: Self-pay | Admitting: Neurology

## 2013-03-18 ENCOUNTER — Ambulatory Visit (HOSPITAL_COMMUNITY): Admission: RE | Admit: 2013-03-18 | Payer: PRIVATE HEALTH INSURANCE | Source: Ambulatory Visit

## 2013-03-18 NOTE — Telephone Encounter (Signed)
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  I instructed her to start aspirin 81mg  daily.  She is scheduled for CTA of the neck tomorrow.  We will also perform the rest of the stroke workup:  Fasting lipid profile, Hgb A1c, and 2D echo.  I would like to see her back when these tests are finished.  I also recommended that they come by sooner if possible to fill out consent forms so we can relay her medical status to her husband.

## 2013-03-18 NOTE — Telephone Encounter (Signed)
I attempted several time to reach this patient yesterday 03/17/13  And was unable to do so at either number given  I spoke with Abigail Butts x3 and when she tried to transfer me to the patient nurse on [phone] no on picked up the call

## 2013-03-18 NOTE — Telephone Encounter (Signed)
Pt's spouse called requesting to speak to a nurse. He is upset. Please call pt.

## 2013-03-19 ENCOUNTER — Ambulatory Visit (HOSPITAL_COMMUNITY)
Admission: RE | Admit: 2013-03-19 | Discharge: 2013-03-19 | Disposition: A | Discharge status: Home / Self Care | Payer: PRIVATE HEALTH INSURANCE | Source: Ambulatory Visit | Attending: Neurology | Admitting: Neurology

## 2013-03-19 DIAGNOSIS — J3800 Paralysis of vocal cords and larynx, unspecified: Secondary | ICD-10-CM | POA: Insufficient documentation

## 2013-03-19 DIAGNOSIS — I6529 Occlusion and stenosis of unspecified carotid artery: Secondary | ICD-10-CM | POA: Insufficient documentation

## 2013-03-19 DIAGNOSIS — I658 Occlusion and stenosis of other precerebral arteries: Secondary | ICD-10-CM | POA: Insufficient documentation

## 2013-03-19 MED ORDER — IOHEXOL 350 MG/ML SOLN
50.0000 mL | Freq: Once | INTRAVENOUS | Status: AC | PRN
  Administered 2013-03-19: 50 mL via INTRAVENOUS

## 2013-03-23 ENCOUNTER — Other Ambulatory Visit: Payer: Self-pay | Admitting: *Deleted

## 2013-03-23 DIAGNOSIS — T6701XA Heatstroke and sunstroke, initial encounter: Secondary | ICD-10-CM

## 2013-03-25 NOTE — Telephone Encounter (Signed)
I spoke with Katherine Carr patient's nurse she is aware patient will have a 2 D ECHO at [facility] on Friday 03/27/13 at 11am and patient is to have fasting lipid and hgbA1c done that same morning Lequita Halt took lab slip to [place] @12 :20pm  Patient unable to pick up slips .

## 2013-03-26 ENCOUNTER — Telehealth: Payer: Self-pay | Admitting: Neurology

## 2013-03-26 NOTE — Telephone Encounter (Signed)
The nursing home nurse called her name is Miss Katherine Carr and would like you to call her back at [phone] she has some questions please

## 2013-03-27 ENCOUNTER — Other Ambulatory Visit: Payer: Self-pay | Admitting: Neurology

## 2013-03-27 ENCOUNTER — Ambulatory Visit (HOSPITAL_COMMUNITY)
Admission: RE | Admit: 2013-03-27 | Discharge: 2013-03-27 | Disposition: A | Discharge status: Home / Self Care | Payer: PRIVATE HEALTH INSURANCE | Source: Ambulatory Visit | Attending: Neurology | Admitting: Neurology

## 2013-03-27 DIAGNOSIS — I079 Rheumatic tricuspid valve disease, unspecified: Secondary | ICD-10-CM | POA: Insufficient documentation

## 2013-03-27 DIAGNOSIS — C50919 Malignant neoplasm of unspecified site of unspecified female breast: Secondary | ICD-10-CM | POA: Insufficient documentation

## 2013-03-27 DIAGNOSIS — F3289 Other specified depressive episodes: Secondary | ICD-10-CM | POA: Insufficient documentation

## 2013-03-27 DIAGNOSIS — K219 Gastro-esophageal reflux disease without esophagitis: Secondary | ICD-10-CM | POA: Insufficient documentation

## 2013-03-27 DIAGNOSIS — I369 Nonrheumatic tricuspid valve disorder, unspecified: Secondary | ICD-10-CM

## 2013-03-27 DIAGNOSIS — Z87891 Personal history of nicotine dependence: Secondary | ICD-10-CM | POA: Insufficient documentation

## 2013-03-27 DIAGNOSIS — K5730 Diverticulosis of large intestine without perforation or abscess without bleeding: Secondary | ICD-10-CM | POA: Insufficient documentation

## 2013-03-27 DIAGNOSIS — F329 Major depressive disorder, single episode, unspecified: Secondary | ICD-10-CM | POA: Insufficient documentation

## 2013-03-27 DIAGNOSIS — E785 Hyperlipidemia, unspecified: Secondary | ICD-10-CM | POA: Insufficient documentation

## 2013-03-27 DIAGNOSIS — G20 Parkinson's disease: Secondary | ICD-10-CM | POA: Insufficient documentation

## 2013-03-27 DIAGNOSIS — T6701XA Heatstroke and sunstroke, initial encounter: Secondary | ICD-10-CM

## 2013-03-27 DIAGNOSIS — I517 Cardiomegaly: Secondary | ICD-10-CM | POA: Insufficient documentation

## 2013-03-27 DIAGNOSIS — F411 Generalized anxiety disorder: Secondary | ICD-10-CM | POA: Insufficient documentation

## 2013-03-27 DIAGNOSIS — E669 Obesity, unspecified: Secondary | ICD-10-CM | POA: Insufficient documentation

## 2013-03-27 DIAGNOSIS — G20A1 Parkinson's disease without dyskinesia, without mention of fluctuations: Secondary | ICD-10-CM | POA: Insufficient documentation

## 2013-03-27 DIAGNOSIS — I10 Essential (primary) hypertension: Secondary | ICD-10-CM | POA: Insufficient documentation

## 2013-03-27 DIAGNOSIS — E119 Type 2 diabetes mellitus without complications: Secondary | ICD-10-CM | POA: Insufficient documentation

## 2013-03-27 DIAGNOSIS — I519 Heart disease, unspecified: Secondary | ICD-10-CM | POA: Insufficient documentation

## 2013-03-27 DIAGNOSIS — Z8673 Personal history of transient ischemic attack (TIA), and cerebral infarction without residual deficits: Secondary | ICD-10-CM | POA: Insufficient documentation

## 2013-03-27 LAB — HEMOGLOBIN A1C
Hgb A1c MFr Bld: 8.4 % — ABNORMAL HIGH (ref <5.7)
Mean Plasma Glucose: 194 mg/dL — ABNORMAL HIGH (ref <117)

## 2013-03-27 LAB — LIPID PANEL
CHOLESTEROL: 208 mg/dL — AB (ref 0–200)
HDL: 48 mg/dL (ref >39)
LDL Cholesterol: 125 mg/dL — ABNORMAL HIGH (ref 0–99)
TRIGLYCERIDES: 173 mg/dL — AB (ref <150)
Total CHOL/HDL Ratio: 4.3 Ratio
VLDL: 35 mg/dL (ref 0–40)

## 2013-03-27 NOTE — Progress Notes (Signed)
  Echocardiogram 2D Echocardiogram has been performed.  Katherine Carr, [place] 03/27/2013, 10:40 AM

## 2013-03-30 NOTE — Telephone Encounter (Signed)
Lab results were given and results were faxed to nurse [facility]

## 2013-04-02 ENCOUNTER — Other Ambulatory Visit (HOSPITAL_BASED_OUTPATIENT_CLINIC_OR_DEPARTMENT_OTHER): Payer: PRIVATE HEALTH INSURANCE

## 2013-04-02 ENCOUNTER — Ambulatory Visit (HOSPITAL_BASED_OUTPATIENT_CLINIC_OR_DEPARTMENT_OTHER): Payer: PRIVATE HEALTH INSURANCE | Admitting: Oncology

## 2013-04-02 VITALS — BP 117/78 | HR 102 | Temp 97.9°F | Resp 18

## 2013-04-02 DIAGNOSIS — C50919 Malignant neoplasm of unspecified site of unspecified female breast: Secondary | ICD-10-CM

## 2013-04-02 DIAGNOSIS — D0590 Unspecified type of carcinoma in situ of unspecified breast: Secondary | ICD-10-CM

## 2013-04-02 LAB — CBC WITH DIFFERENTIAL/PLATELET
BASO%: 0.7 % (ref 0.0–2.0)
Basophils Absolute: 0 10*3/uL (ref 0.0–0.1)
EOS%: 2.9 % (ref 0.0–7.0)
Eosinophils Absolute: 0.2 10*3/uL (ref 0.0–0.5)
HCT: 39 % (ref 34.8–46.6)
HGB: 13.3 g/dL (ref 11.6–15.9)
LYMPH#: 1.6 10*3/uL (ref 0.9–3.3)
LYMPH%: 30.7 % (ref 14.0–49.7)
MCH: 31.5 pg (ref 25.1–34.0)
MCHC: 34 g/dL (ref 31.5–36.0)
MCV: 92.7 fL (ref 79.5–101.0)
MONO#: 0.3 10*3/uL (ref 0.1–0.9)
MONO%: 6.4 % (ref 0.0–14.0)
NEUT%: 59.3 % (ref 38.4–76.8)
NEUTROS ABS: 3.2 10*3/uL (ref 1.5–6.5)
Platelets: 212 10*3/uL (ref 145–400)
RBC: 4.2 10*6/uL (ref 3.70–5.45)
RDW: 14.8 % — AB (ref 11.2–14.5)
WBC: 5.4 10*3/uL (ref 3.9–10.3)

## 2013-04-02 LAB — COMPREHENSIVE METABOLIC PANEL (CC13)
ALBUMIN: 3.2 g/dL — AB (ref 3.5–5.0)
ALT: <6 U/L (ref 0–55)
ANION GAP: 10 meq/L (ref 3–11)
AST: 15 U/L (ref 5–34)
Alkaline Phosphatase: 175 U/L — ABNORMAL HIGH (ref 40–150)
BUN: 26.6 mg/dL — AB (ref 7.0–26.0)
CO2: 28 meq/L (ref 22–29)
Calcium: 9.4 mg/dL (ref 8.4–10.4)
Chloride: 102 mEq/L (ref 98–109)
Creatinine: 0.9 mg/dL (ref 0.6–1.1)
GLUCOSE: 373 mg/dL — AB (ref 70–140)
POTASSIUM: 4.2 meq/L (ref 3.5–5.1)
SODIUM: 140 meq/L (ref 136–145)
TOTAL PROTEIN: 6.4 g/dL (ref 6.4–8.3)
Total Bilirubin: 0.55 mg/dL (ref 0.20–1.20)

## 2013-04-02 NOTE — Progress Notes (Signed)
[place]  [phone] Fax:([phone]) [phone]  MEDICAL ONCOLOGY NEW PATIENT EVALUATION    ID: Katherine Carr   DOB: 10/26/1940  MR#: [id number]  [id number]   PCP: Wenda Low, MD SU: Fanny Skates, M.D. OTHER MD: Dr. Buddy Duty, Dr. Adele Schilder, Dr. Zadie Rhine, Dr. Ellene Route, Dr. Leo Grosser, Dr. Penelope Coop, Dr Tomi Likens    HISTORY OF PRESENT ILLNESS: The patient states that she felt a "knot" in her right breast in 02/2012 and requested to have a mammogram.  The patient had a unilateral right digital diagnostic mammogram on 03/06/2012 which showed a subtle superficial focal density over the region of the patient's palpable abnormality.  The remainder of the exam was unchanged.  The patient underwent a right breast ultrasound on the same day which showed a 4 x 8 x 8 mm intraductal mass within the superficial duct at the 2:00 position of the right periareolar region  2 cm from the nipple corresponding to patient's palpable abnormality.  The patient had a right needle core breast biopsy at the 2:00 position, 2 cm from the nipple on 03/06/2012 which showed intraductal papilloma with usual ductal hyperplasia.  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  Ductal carcinoma in situ measures 0.8 cm in greatest dimension.  The tumor involves cauterized posterior margin age.  Other margins are negative.  ER 98% positive, stage 0, pTis, pNX, MX.  Left abdominal wall nevus shows seborrheic keratosis no dysplasia or malignancy identified.  The patient has a history of left breast needle localized lumpectomy on 10/10/2008 which showed intraductal papillomas, focally transected at 8 margin of resection.  Fibrocystic changes.  Usual ductal hyperplasia.   INTERVAL HISTORY: Katherine Carr today accompanied by her (separated) husband Katherine Carr for  followup of Katherine Carr right breast carcinoma. Since her appointment here she has been evaluated by Dr. Tomi Likens for Parkinson's disease and possible stroke. That evaluation is still in progress.  REVIEW OF SYSTEMS: Katherine Carr is residing at [facility]. She is not currently participating in physical therapy. She denies headaches, nausea, vomiting, cough, phlegm production, pleurisy, or change in bowel or bladder habits. A detailed review of systems today was not suggestive of recurrent breast cancer.  PAST MEDICAL HISTORY: Past Medical History  Diagnosis Date  . Depression   . Diabetes mellitus   . Hypertension   . Peripheral neuropathy   . Diverticulosis   . Urinary incontinence   . Herpes   . Ovarian cyst   . Fibrous breast lumps   . Wears glasses   . Fibromyalgia   . GERD (gastroesophageal reflux disease)   . COPD (chronic obstructive pulmonary disease)   . Asthma     bronchial  . Fatty liver     hx: of  . Anxiety   . Left carotid stenosis     Hx: of  . Breast cancer     "both sides" (10/29/2012)  . Osteoarthritis     Katherine Carr 09/12/2007  (10/29/2012)  . H/O hiatal hernia     Katherine Carr 09/12/2007  (10/29/2012)  . Migraines     Katherine Carr 09/01/2001 (10/29/2012)    PAST SURGICAL HISTORY: Past Surgical History  Procedure Laterality Date  . Carpal tunnel release Bilateral   . Cesarean section    . Abdominal hysterectomy    . Myomectomy    . Breast lumpectomy with needle localization Right 04/02/2012    Procedure: Excision Ductal System Right  Breast with Needle Localization;  Surgeon: Adin Hector, MD;  Location: [facility];  Service: General;  Laterality: Right;  needle localization at 7:30 at [facility]   . Nevus excision Left 04/02/2012    Procedure: Excision Nevus Left Abdominal Wall;  Surgeon: Adin Hector, MD;  Location: [facility];  Service: General;  Laterality: Left;  . Breast lumpectomy Right 04/21/2012    Procedure: Right breast LUMPECTOMY with  re-excision of margins;  Surgeon: Adin Hector, MD;  Location: [facility];  Service: General;  Laterality: Right;  . Tonsillectomy    . Appendectomy    . Breast ductal system excision Left     Katherine Carr 04/02/2012  (10/29/2012)  . Back surgery      central decompression L5-S1/notes 10/23/2005 (10/29/2012)  . Anterior cervical decomp/discectomy fusion  09/2003    Katherine Carr 09/25/2003 (10/29/2012)    FAMILY HISTORY Family History  Problem Relation Age of Onset  . Heart disease Mother   . Heart disease Brother   . Diabetes Brother   . Arthritis Sister   . Stroke Sister   . Cirrhosis Brother   . Cancer Brother     Bladder cancer  She has a sister Katherine Carr who has a history of having arthritis and a stroke.  She has a brother named Katherine Carr who had heart disease and diabetes.  She has another brother Katherine Carr who had cirrhosis and bladder cancer  GYNECOLOGIC HISTORY: Gravida 1, para 1, age of menarche 26, first live birth at the age of 28, last menstrual period in 1974, the patient has history of estradiol hormone replacement from 2001 until this year (2014).  SOCIAL HISTORY: Katherine Carr was born in [place]) [place], [place] and got married to her first husband when she was 42 years old.  She had her first and only child with her first husband.  Her son Katherine Carr, is a Personnel officer in the [employer] and stationed in [place].  She was married for a second time when she was 73 years old and married for the third and last time when she was 73 years old.  She was a Theatre manager for 30 years.  She is also an Education administrator.  She has 2 teenage granddaughters Katherine Carr that is 73 years old and Katherine Carr that is 72 years old.  They reside in [place] with her father.  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     ADVANCED DIRECTIVES: The patient states that she  wishes to be a DO NOT RESUSCITATE and the official document is on file with [facility].  Her medical power of attorney is Katherine Carr who may be reached at [phone].  HEALTH MAINTENANCE: History  Substance Use Topics  . Smoking status: Former Smoker -- 20 years    Types: Cigarettes    Quit date: 08/15/1996  . Smokeless tobacco: Never Used     Comment: smoked 2-3 cigarettes/day  . Alcohol Use: No    Colonoscopy: 2012 PAP: 1971 Bone density: 2012 Lipid panel: Not on file  Allergies  Allergen Reactions  . Sertraline Hcl Hives  . Codeine Hives and Swelling  . Cyclobenzaprine Hcl Hives and Swelling  . Fentanyl     REACTION: PANIC ATTACKS  . Furosemide     REACTION: face was swollen and hand  . Gadolinium Derivatives Swelling  . Hydrocodone-Acetaminophen   .  Iohexol      Code: SOB, Desc: xray dye, Onset Date: [id number]   . Levofloxacin     REACTION: PANIC ATTACK,DIARRHEA,EYES DISCOLORD  . Lorazepam Nausea And Vomiting  . Lubiprostone     REACTION: REALLY BAD NAUSEA  . Metformin Hives and Swelling  . Metoclopramide Nausea And Vomiting and Swelling  . Morphine     REACTION: itching, panic attacks  . Oxycodone Hives and Swelling  . Promethazine Hcl     REACTION: TRAMA ANDJITTERS IN MY BODY  . Rosiglitazone Hives and Swelling  . Sertraline Hcl Hives and Swelling  . Verapamil Hives and Nausea And Vomiting    Current Outpatient Prescriptions  Medication Sig Dispense Refill  . acetaminophen (TYLENOL) 325 MG tablet Take 650 mg by mouth every 4 (four) hours as needed for moderate pain.      Marland Kitchen alum & mag hydroxide-simeth (MAALOX/MYLANTA) 200-200-20 MG/5ML suspension Take 30 mLs by mouth every 4 (four) hours as needed for indigestion.      Marland Kitchen anastrozole (ARIMIDEX) 1 MG tablet Take 1 mg by mouth daily.      . ARIPiprazole (ABILIFY) 2 MG tablet Take 2 mg by mouth daily. *takes with 5mg  tablet for 7mg  dose      . ARIPiprazole (ABILIFY) 5 MG tablet Take 5 mg  by mouth at bedtime. *takes with 2mg  tablet for 7mg  dose      . bisacodyl (DULCOLAX) 10 MG suppository Place 1 suppository (10 mg total) rectally daily as needed for moderate constipation.  12 suppository  0  . carbidopa-levodopa (SINEMET) 25-100 MG per tablet Take 1 tablet by mouth 3 (three) times daily.      . cefUROXime (CEFTIN) 500 MG tablet Take 1 tablet (500 mg total) by mouth 2 (two) times daily with a meal.  10 tablet  0  . cetirizine (ZYRTEC) 5 MG tablet Take 5 mg by mouth daily as needed for allergies.       . Cholecalciferol (VITAMIN D3) 1000 UNITS CAPS Take 2,000 Units by mouth daily.      . diclofenac sodium (VOLTAREN) 1 % GEL Apply 4 g topically 3 (three) times daily as needed (joint pain).      Marland Kitchen docusate sodium 100 MG CAPS Take 100 mg by mouth 2 (two) times daily.  10 capsule  0  . DULoxetine (CYMBALTA) 60 MG capsule Take 60 mg by mouth 2 (two) times daily.      . fluticasone (FLONASE) 50 MCG/ACT nasal spray Place 2 sprays into both nostrils daily as needed for rhinitis or allergies.    2  . gabapentin (NEURONTIN) 600 MG tablet Take 600 mg by mouth 3 (three) times daily.        Marland Kitchen HYDROcodone-acetaminophen (NORCO/VICODIN) 5-325 MG per tablet Take 2 tablets by mouth every 6 (six) hours as needed.  10 tablet  0  . hydrOXYzine (ATARAX/VISTARIL) 10 MG tablet Take 1 tablet (10 mg total) by mouth every 8 (eight) hours as needed for itching.  30 tablet  0  . insulin glargine (LANTUS) 100 UNIT/ML injection Inject 100 Units into the skin 2 (two) times daily.      . insulin lispro (HUMALOG) 100 UNIT/ML injection Inject 100 Units into the skin 3 (three) times daily before meals.      Marland Kitchen ipratropium-albuterol (DUONEB) 0.5-2.5 (3) MG/3ML SOLN Take 3 mLs by nebulization every 6 (six) hours as needed (shortness of breath).  360 mL  2  . lansoprazole (PREVACID) 30 MG capsule Take 30 mg by  mouth daily.      Marland Kitchen lisinopril (PRINIVIL,ZESTRIL) 2.5 MG tablet Take 2.5 mg by mouth daily.       . Melatonin 3  MG TABS Take 3 mg by mouth at bedtime.      . methylcellulose (ARTIFICIAL TEARS) 1 % ophthalmic solution Place 2 drops into both eyes 2 (two) times daily as needed (dry eyes).      . metoprolol succinate (TOPROL-XL) 25 MG 24 hr tablet Take 25 mg by mouth daily.        . metroNIDAZOLE (FLAGYL) 500 MG tablet Take 1 tablet (500 mg total) by mouth 3 (three) times daily.  15 tablet  0  . ondansetron (ZOFRAN) 4 MG tablet Take 4 mg by mouth every 8 (eight) hours as needed for nausea or vomiting.      . pravastatin (PRAVACHOL) 40 MG tablet Take 40 mg by mouth daily.      Orlie Dakin Sodium (SENNA S PO) Take 1 tablet by mouth 2 (two) times daily.       . temazepam (RESTORIL) 15 MG capsule Take 1 capsule (15 mg total) by mouth at bedtime as needed (for insomnia).  30 capsule  0  . [DISCONTINUED] ClonazePAM (KLONOPIN PO) Take 0.2 mg by mouth 2 (two) times daily.        . [DISCONTINUED] escitalopram (LEXAPRO) 20 MG tablet Take 10 mg by mouth. Take 3 tablets in the AM      . [DISCONTINUED] esomeprazole (NEXIUM) 40 MG capsule Take 40 mg by mouth daily before breakfast.        . [DISCONTINUED] Insulin Regular Human (HUMULIN R U-500, CONCENTRATED, [place]) Inject into the skin 3 (three) times daily. Take three times / day prn, per SS Insulin coverage       No current facility-administered medications for this visit.    OBJECTIVE:  Elderly white woman examined in a wheelchair Filed Vitals:   04/02/13 1137  BP: 117/78  Pulse: 102  Temp: 97.9 F (36.6 C)  Resp: 18     Body mass index is 0.00 kg/(m^2).   (Pt unable to stand for weight)  ECOG: 2  Sclerae unicteric, pupils equal and reactive Oropharynx clear and dry, no lesions noted No cervical or supraclavicular adenopathy Lungs no rales or rhonchi Heart regular rate and rhythm Abd soft, nontender, positive bowel sounds MSK no focal spinal tenderness Neuro: She has bilateral hand contractures; hip flexion is 4/5 bilaterally Breasts: Both breasts  are status post lumpectomy. There is no evidence of local recurrence in either breast. Both axillae are benign      LAB RESULTS: Lab Results  Component Value Date   WBC 5.4 04/02/2013   NEUTROABS 3.2 04/02/2013   HGB 13.3 04/02/2013   HCT 39.0 04/02/2013   MCV 92.7 04/02/2013   PLT 212 04/02/2013      Chemistry      Component Value Date/Time   NA 143 02/05/2013 0716   NA 137 08/22/2012 1323   K 3.7 02/05/2013 0716   K 4.9 08/22/2012 1323   CL 103 02/05/2013 0716   CL 97* 05/21/2012 1432   CO2 27 02/05/2013 0716   CO2 27 08/22/2012 1323   BUN 32* 02/05/2013 0716   BUN 20.4 08/22/2012 1323   CREATININE 0.63 02/05/2013 0716   CREATININE 0.8 08/22/2012 1323      Component Value Date/Time   CALCIUM 8.4 02/05/2013 0716   CALCIUM 9.3 08/22/2012 1323   ALKPHOS 196* 02/05/2013 0716   ALKPHOS 218* 08/22/2012  1323   AST 66* 02/05/2013 0716   AST 14 08/22/2012 1323   ALT 20 02/05/2013 0716   ALT 34 08/22/2012 1323   BILITOT 0.9 02/05/2013 0716   BILITOT 0.51 08/22/2012 1323      STUDIES:  Ct Angio Neck W/cm &/or Wo/cm  03/19/2013   CLINICAL DATA:  73 year old female with stroke-like symptoms last October. Disequilibrium, tremor and abnormal muscle tone. Initial encounter.  EXAM: CT ANGIOGRAPHY NECK  TECHNIQUE: Multidetector CT imaging of the neck was performed using the standard protocol during bolus administration of intravenous contrast. Multiplanar CT image reconstructions and MIPs were obtained to evaluate the vascular anatomy. Carotid stenosis measurements (when applicable) are obtained utilizing NASCET criteria, using the distal internal carotid diameter as the denominator.  CONTRAST:  58mL OMNIPAQUE IOHEXOL 350 MG/ML SOLN  COMPARISON:  Head CT without contrast 02/05/2013. Brain MRI 10/28/2012. CT neck 02/01/2012.  FINDINGS: Chronic cervical ACDF. No acute osseous abnormality identified. Visualized paranasal sinuses and mastoids are clear. No acute findings in the visible orbits soft tissues. Grossly negative  visualized brain parenchyma. No superior mediastinal lymphadenopathy. 10 mm hypodense nodule in the right thyroid lobe is stable and does not require follow-up in this age range. 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 Stable lingual tonsil hypertrophy and pharynx contours. Parapharyngeal spaces, retropharyngeal space, sublingual space, submandibular glands, and parotid glands remain normal. No cervical lymphadenopathy identified.  VASCULAR FINDINGS:  Three vessel arch configuration with mild arch atherosclerosis. No great vessel origin stenosis.  No right CCA origin stenosis. Soft plaque in the medial right CCA at the level of the larynx without stenosis. Patent right carotid bifurcation. No right ICA origin or bulb stenosis. Negative cervical right ICA except for mild tortuosity. Negative visible right ICA siphon except for calcified plaque.  No proximal right subclavian artery stenosis. Right vertebral artery origin within normal limits. Negative right vertebral artery. Normal right PICA origin. Normal vertebrobasilar junction. Negative visualized basilar artery.  Normal left CCA origin. Soft and calcified plaque in the distal left CCA and at the left carotid bifurcation. Bulky soft plaque in the proximal left ICA but resulting in stenosis of less than 50% with respect to the distal vessel. Be on the bulb level, the cervical left ICA is negative except for mild tortuosity. Negative visible left ICA siphon except for minor calcified plaque.  No proximal left subclavian artery stenosis despite soft and calcified plaque. Normal left vertebral artery origin. Tortuous distal cervical left vertebral artery with a kinked appearance (series 6, image 79). Otherwise negative vertebral artery. Dominant left AICA.  Review of the MIP images confirms the above findings.  IMPRESSION: 1. Atherosclerotic plaque at the left carotid bifurcation and in the left ICA bulb but no hemodynamically significant stenosis  in the neck. 2. Some tortuosity of the great vessels, including the left vertebral artery. 3. Stable CT appearance of the neck since 2014, including left vocal cord paralysis.   Electronically Signed   By: Lars Pinks M.D.   On: 03/19/2013 11:14      ASSESSMENT: 72 y.o. [place], [place] woman:  1.  Status post left breast needle localized lumpectomy on 10/20/2008 which showed intraductal papillomas, focally transected at inked margin of resection, fibrocystic changes, usual ductal hyperplasia.  2.  Status post right breast needle core biopsy of the 2 o'clock position on 03/06/2012 which showed intraductal papilloma with usual ductal hyperplasia.  3.  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  Status post right breast excision on 04/21/2012 which showed intraductal papilloma, focal atypical ductal hyperplasia,  microcalcifications identified.  5.  patient declined adjuvant radiation.  6.  Began on anastrozole, 1 mg daily, in may 2014, the goal being to continue for total of 5 years   PLAN: Katherine Carr is doing fine as far as her noninvasive breast cancer is concerned. The plan is to continue anastrozole for a total of 5 years. She is tolerating it with no side effects it she is aware of and particularly hot flashes and vaginal dryness or not a concern.  Her major issues or neurological and she is followed for this by Dr. Tomi Likens.  The patient missed her mammogram March 2015. This is being rescheduled.  Katherine Carr will see me again in one year. She knows to call for any breast cancer related problems that may develop before then  Chauncey Cruel, MD  04/02/2013, 11:56 AM

## 2013-04-03 ENCOUNTER — Telehealth: Payer: Self-pay | Admitting: Oncology

## 2013-04-03 NOTE — Telephone Encounter (Signed)
lvm for pt regarding to April 2015 mammo////also lvm for April 2016 appt....mailed pt appt sched/avs and letter

## 2013-04-07 ENCOUNTER — Telehealth: Payer: Self-pay | Admitting: Neurology

## 2013-04-07 NOTE — Telephone Encounter (Signed)
Spoke with patients husband and advised him to speak with her primary care at the nursing home about getting her help with eating

## 2013-04-07 NOTE — Telephone Encounter (Signed)
Husband came by the office. Would like to speak with someone regarding wife's condition - we have DPR on file. Per husband, pt is having difficulty feeding herself. He goes to the nursing home as he is able to assist her in eating but would like Dr. Tomi Likens to provide a note to the nursing home requesting she receive aid from the nursing home staff in feeding. Please call [phone] / Sherri S.

## 2013-04-14 ENCOUNTER — Other Ambulatory Visit: Payer: Self-pay | Admitting: Oncology

## 2013-04-14 ENCOUNTER — Ambulatory Visit
Admission: RE | Admit: 2013-04-14 | Discharge: 2013-04-14 | Disposition: A | Discharge status: Home / Self Care | Payer: PRIVATE HEALTH INSURANCE | Source: Ambulatory Visit | Attending: Oncology | Admitting: Oncology

## 2013-04-14 ENCOUNTER — Telehealth: Payer: Self-pay | Admitting: Neurology

## 2013-04-14 DIAGNOSIS — D249 Benign neoplasm of unspecified breast: Secondary | ICD-10-CM

## 2013-04-14 DIAGNOSIS — C50919 Malignant neoplasm of unspecified site of unspecified female breast: Secondary | ICD-10-CM

## 2013-04-14 NOTE — Telephone Encounter (Signed)
Pt son Katherine Carr is calling and would like to talk to someone please call [phone]

## 2013-04-14 NOTE — Telephone Encounter (Signed)
Patients son Corene Cornea called to ask what was going on with his mother I tried to explain that he was not listed in her chart as a contact that I could discuss information with . He question her disease again I explained to him that was not information I could give out as he is not listed as a contact to discuss with .

## 2013-04-15 ENCOUNTER — Encounter: Payer: Self-pay | Admitting: Neurology

## 2013-05-22 ENCOUNTER — Encounter: Payer: Self-pay | Admitting: Neurology

## 2013-05-22 ENCOUNTER — Ambulatory Visit (INDEPENDENT_AMBULATORY_CARE_PROVIDER_SITE_OTHER): Payer: PRIVATE HEALTH INSURANCE | Admitting: Neurology

## 2013-05-22 VITALS — BP 150/88 | HR 81 | Temp 97.8°F

## 2013-05-22 DIAGNOSIS — G3184 Mild cognitive impairment, so stated: Secondary | ICD-10-CM

## 2013-05-22 DIAGNOSIS — I6350 Cerebral infarction due to unspecified occlusion or stenosis of unspecified cerebral artery: Secondary | ICD-10-CM

## 2013-05-22 DIAGNOSIS — I639 Cerebral infarction, unspecified: Secondary | ICD-10-CM

## 2013-05-22 DIAGNOSIS — G20 Parkinson's disease: Secondary | ICD-10-CM

## 2013-05-22 NOTE — Progress Notes (Signed)
NEUROLOGY FOLLOW UP OFFICE NOTE  [name] [id number]  HISTORY OF PRESENT ILLNESS: Katherine Carr is a 73 year old right-handed woman from nursing home with history of depression, uncontrolled diabetes, hyperlipidemia and uncontrolled hypertension who follows up for Parkinson's disease and stroke.  Records and images were personally reviewed where available.    UPDATE: Last visit, mentioned transient episodes of slurred speech and exhibited some mild weakness of the left arm.   MRI brain revealed small acute to subacute infarct in the right basal ganglia.  Recommended to begin ASA 81mg  daily and underwent stroke workup.  03/09/13 Carotid dopplers revealed 1-39% right ICA stenosis based on ICA/CCA ration, and 1-39% left ICA stenosis based on plaque morphology but possibly 40-59% stenosis based on ICA/CCA ration (1.86).  03/19/13 CTA of neck revealed atherosclerotic plaque in the left carotid bifurcation and left ICA bulb, but no hemodynamically significant stenosis.  03/27/13 2D Echo:  EF 55-60%  03/27/13 LABS:  Hgb A1c 8.4, LDL 125  She was recently admitted to the hospital on 02/04/13 for altered mental status.  She was found to have a UTI and was treated.  She continues to have weakness in her arms and hands.  She uses a wrist splint for her left hand.  She has difficulty using her hands and does undergo PT.  HISTORY: She was admitted to the hospital from 10/28/12 to 10/30/12 for stroke like symptoms, presenting as confusing and leaning towards the left.  CT head revealed no acute abnormalities.  MRI of brain was unremarkable.  She was found to have a UTI.  LFTs were initially elevated and thought to be secondary to CBD.  They later normalized.  Neurological exam revealed cogwheel rigidity and pill-rolling resting tremor in left hand.  She also endorsed memory problems and visual hallucinations.  She was started on Sinemet and currently takes 25/100mg  TID but was discontinued after  discharge until she can be evaluated in clinic.  She has been wheelchair-bound for two years, she says due to diabetic neuropathy.  She fell multiple times.  She also has history of cervical stenosis as well as "lumbar problems".  She notes occasional tremor in her left hand and difficulty sometimes using the hand.  She notes feeling of lightheadedness and spinning if she sits up from bed.  She denies difficulty swallowing.  She has right arm pain stemming from her shoulder.  No numbness in the right arm.  She does report history of vivid dreams but denies anyone telling her she has symptoms consistent with REM sleep behavior disorder.  She denies memory problems.  She notes that her father had tremors.  PAST MEDICAL HISTORY: Past Medical History  Diagnosis Date  . Depression   . Diabetes mellitus   . Hypertension   . Peripheral neuropathy   . Diverticulosis   . Urinary incontinence   . Herpes   . Ovarian cyst   . Fibrous breast lumps   . Wears glasses   . Fibromyalgia   . GERD (gastroesophageal reflux disease)   . COPD (chronic obstructive pulmonary disease)   . Asthma     bronchial  . Fatty liver     hx: of  . Anxiety   . Left carotid stenosis     Hx: of  . Breast cancer     "both sides" (10/29/2012)  . Osteoarthritis     Archie Endo 09/12/2007  (10/29/2012)  . H/O hiatal hernia     Archie Endo 09/12/2007  (10/29/2012)  . Migraines     /  notes 09/01/2001 (10/29/2012)    MEDICATIONS: Current Outpatient Prescriptions on File Prior to Visit  Medication Sig Dispense Refill  . acetaminophen (TYLENOL) 325 MG tablet Take 650 mg by mouth every 4 (four) hours as needed for moderate pain.      Marland Kitchen alum & mag hydroxide-simeth (MAALOX/MYLANTA) 200-200-20 MG/5ML suspension Take 30 mLs by mouth every 4 (four) hours as needed for indigestion.      Marland Kitchen anastrozole (ARIMIDEX) 1 MG tablet Take 1 mg by mouth daily.      . ARIPiprazole (ABILIFY) 2 MG tablet Take 2 mg by mouth daily. *takes with 5mg  tablet for  7mg  dose      . ARIPiprazole (ABILIFY) 5 MG tablet Take 5 mg by mouth at bedtime. *takes with 2mg  tablet for 7mg  dose      . bisacodyl (DULCOLAX) 10 MG suppository Place 1 suppository (10 mg total) rectally daily as needed for moderate constipation.  12 suppository  0  . carbidopa-levodopa (SINEMET) 25-100 MG per tablet Take 1 tablet by mouth 3 (three) times daily.      . cefUROXime (CEFTIN) 500 MG tablet Take 1 tablet (500 mg total) by mouth 2 (two) times daily with a meal.  10 tablet  0  . cetirizine (ZYRTEC) 5 MG tablet Take 5 mg by mouth daily as needed for allergies.       . Cholecalciferol (VITAMIN D3) 1000 UNITS CAPS Take 2,000 Units by mouth daily.      . diclofenac sodium (VOLTAREN) 1 % GEL Apply 4 g topically 3 (three) times daily as needed (joint pain).      Marland Kitchen docusate sodium 100 MG CAPS Take 100 mg by mouth 2 (two) times daily.  10 capsule  0  . DULoxetine (CYMBALTA) 60 MG capsule Take 60 mg by mouth 2 (two) times daily.      . fluticasone (FLONASE) 50 MCG/ACT nasal spray Place 2 sprays into both nostrils daily as needed for rhinitis or allergies.    2  . gabapentin (NEURONTIN) 600 MG tablet Take 600 mg by mouth 3 (three) times daily.        Marland Kitchen HYDROcodone-acetaminophen (NORCO/VICODIN) 5-325 MG per tablet Take 2 tablets by mouth every 6 (six) hours as needed.  10 tablet  0  . hydrOXYzine (ATARAX/VISTARIL) 10 MG tablet Take 1 tablet (10 mg total) by mouth every 8 (eight) hours as needed for itching.  30 tablet  0  . insulin glargine (LANTUS) 100 UNIT/ML injection Inject 100 Units into the skin 2 (two) times daily.      . insulin lispro (HUMALOG) 100 UNIT/ML injection Inject 100 Units into the skin 3 (three) times daily before meals.      Marland Kitchen ipratropium-albuterol (DUONEB) 0.5-2.5 (3) MG/3ML SOLN Take 3 mLs by nebulization every 6 (six) hours as needed (shortness of breath).  360 mL  2  . lansoprazole (PREVACID) 30 MG capsule Take 30 mg by mouth daily.      Marland Kitchen lisinopril (PRINIVIL,ZESTRIL)  2.5 MG tablet Take 2.5 mg by mouth daily.       . Melatonin 3 MG TABS Take 3 mg by mouth at bedtime.      . methylcellulose (ARTIFICIAL TEARS) 1 % ophthalmic solution Place 2 drops into both eyes 2 (two) times daily as needed (dry eyes).      . metoprolol succinate (TOPROL-XL) 25 MG 24 hr tablet Take 25 mg by mouth daily.        . metroNIDAZOLE (FLAGYL) 500 MG tablet Take 1  tablet (500 mg total) by mouth 3 (three) times daily.  15 tablet  0  . ondansetron (ZOFRAN) 4 MG tablet Take 4 mg by mouth every 8 (eight) hours as needed for nausea or vomiting.      . pravastatin (PRAVACHOL) 40 MG tablet Take 40 mg by mouth daily.      Orlie Dakin Sodium (SENNA S PO) Take 1 tablet by mouth 2 (two) times daily.       . temazepam (RESTORIL) 15 MG capsule Take 1 capsule (15 mg total) by mouth at bedtime as needed (for insomnia).  30 capsule  0  . [DISCONTINUED] ClonazePAM (KLONOPIN PO) Take 0.2 mg by mouth 2 (two) times daily.        . [DISCONTINUED] escitalopram (LEXAPRO) 20 MG tablet Take 10 mg by mouth. Take 3 tablets in the AM      . [DISCONTINUED] esomeprazole (NEXIUM) 40 MG capsule Take 40 mg by mouth daily before breakfast.        . [DISCONTINUED] Insulin Regular Human (HUMULIN R U-500, CONCENTRATED, [place]) Inject into the skin 3 (three) times daily. Take three times / day prn, per SS Insulin coverage       No current facility-administered medications on file prior to visit.    ALLERGIES: Allergies  Allergen Reactions  . Sertraline Hcl Hives  . Codeine Hives and Swelling  . Cyclobenzaprine Hcl Hives and Swelling  . Fentanyl     REACTION: PANIC ATTACKS  . Furosemide     REACTION: face was swollen and hand  . Gadolinium Derivatives Swelling  . Hydrocodone-Acetaminophen   . Iohexol      Code: SOB, Desc: xray dye, Onset Date: [id number]   . Levofloxacin     REACTION: PANIC ATTACK,DIARRHEA,EYES DISCOLORD  . Lorazepam Nausea And Vomiting  . Lubiprostone     REACTION: REALLY BAD NAUSEA  .  Metformin Hives and Swelling  . Metoclopramide Nausea And Vomiting and Swelling  . Morphine     REACTION: itching, panic attacks  . Oxycodone Hives and Swelling  . Promethazine Hcl     REACTION: TRAMA ANDJITTERS IN MY BODY  . Rosiglitazone Hives and Swelling  . Sertraline Hcl Hives and Swelling  . Verapamil Hives and Nausea And Vomiting    FAMILY HISTORY: Family History  Problem Relation Age of Onset  . Heart disease Mother   . Heart disease Brother   . Diabetes Brother   . Arthritis Sister   . Stroke Sister   . Cirrhosis Brother   . Cancer Brother     Bladder cancer    SOCIAL HISTORY: History   Social History  . Marital Status: Divorced    Spouse Name: N/A    Number of Children: N/A  . Years of Education: N/A   Occupational History  . Not on file.   Social History Main Topics  . Smoking status: Former Smoker -- 20 years    Types: Cigarettes    Quit date: 08/15/1996  . Smokeless tobacco: Never Used     Comment: smoked 2-3 cigarettes/day  . Alcohol Use: No  . Drug Use: No  . Sexual Activity: No   Other Topics Concern  . Not on file   Social History Narrative  . No narrative on file    REVIEW OF SYSTEMS: Constitutional: No fevers, chills, or sweats, no generalized fatigue, change in appetite Eyes: No visual changes, double vision, eye pain Ear, nose and throat: No hearing loss, ear pain, nasal congestion, sore throat Cardiovascular: No  chest pain, palpitations Respiratory:  No shortness of breath at rest or with exertion, wheezes GastrointestinaI: No nausea, vomiting, diarrhea, abdominal pain, fecal incontinence Genitourinary:  No dysuria, urinary retention or frequency Musculoskeletal:  No neck pain, back pain Integumentary: No rash, pruritus, skin lesions Neurological: as above Psychiatric: No depression, insomnia, anxiety Endocrine: No palpitations, fatigue, diaphoresis, mood swings, change in appetite, change in weight, increased  thirst Hematologic/Lymphatic:  No anemia, purpura, petechiae. Allergic/Immunologic: no itchy/runny eyes, nasal congestion, recent allergic reactions, rashes  PHYSICAL EXAM: Filed Vitals:   05/22/13 1027  BP: 150/88  Pulse: 81  Temp: 97.8 F (36.6 C)   General: No acute distress Head:  Normocephalic/atraumatic Neck: supple, no paraspinal tenderness, full range of motion Heart:  Regular rate and rhythm Lungs:  Clear to auscultation bilaterally Back: No paraspinal tenderness Neurological Exam: Flat affect.  Alert and oriented to person, place, and time. Attention span and concentration intact, remote memory intact.  Recalled 2 of 3 words.  Performed Trail Making Test incorrectly.  Did not place numbers correctly on clock (she cannot write but she pointed where the numbers should go).  She was able to spell WORLD backwards.  She exhibited left-right dissociation when performing 3 point task across midline.   Speech slowed but fluent and not dysarthric.  Language intact.  Hypomimia.  CN II-XII intact.  Bradykinetic.  Mild resting tremor in left thumb.  Muscle strength 3/5 left triceps (has difficulty extending left elbow), otherwise 4+/5 in upper extremities, 2/5 lower extremities.  absent DTRs. Wheelchair-bound  IMPRESSION: Parkinson's disease Right basal ganglia stroke Mild cognitive impairment, possibly parkinson's related  PLAN: 1.  Would initiate ASA 81mg  daily 2.  Optimize statin therapy (LDL goal should be less than 70) 3.  Optimize blood pressure and diabetes control 4.  Continue Sinemet three times daily 5.  OT of upper extremities. 6.  Considered initiation of Aricept but patient does not wish to start this.    Metta Clines, DO  CC: Wenda Low, MD

## 2013-05-22 NOTE — Patient Instructions (Signed)
1.  Continue Sinemet three times daily.  Should be taken at same time everyday. 2.  Start aspirin 81mg  daily for secondary stroke prevention. 3.  Continue statin therapy (LDL goal should be less than 70) 4.  Optimize blood pressure and diabetes control 5.  Occupational therapy of the hands and upper extremities 6.  Follow up in 3 months.

## 2013-06-01 ENCOUNTER — Encounter (INDEPENDENT_AMBULATORY_CARE_PROVIDER_SITE_OTHER): Payer: Self-pay | Admitting: General Surgery

## 2013-08-25 ENCOUNTER — Ambulatory Visit: Payer: Self-pay | Admitting: Neurology

## 2013-09-15 ENCOUNTER — Encounter: Payer: Self-pay | Admitting: Neurology

## 2013-09-15 ENCOUNTER — Ambulatory Visit (INDEPENDENT_AMBULATORY_CARE_PROVIDER_SITE_OTHER): Payer: PRIVATE HEALTH INSURANCE | Admitting: Neurology

## 2013-09-15 VITALS — BP 182/84 | HR 70 | Temp 98.0°F | Resp 16 | Wt 194.4 lb

## 2013-09-15 DIAGNOSIS — G214 Vascular parkinsonism: Secondary | ICD-10-CM

## 2013-09-15 DIAGNOSIS — I6789 Other cerebrovascular disease: Secondary | ICD-10-CM

## 2013-09-15 DIAGNOSIS — R29898 Other symptoms and signs involving the musculoskeletal system: Secondary | ICD-10-CM

## 2013-09-15 DIAGNOSIS — G20 Parkinson's disease: Secondary | ICD-10-CM

## 2013-09-15 DIAGNOSIS — R29818 Other symptoms and signs involving the nervous system: Secondary | ICD-10-CM

## 2013-09-15 NOTE — Patient Instructions (Addendum)
Parkinson's disease, possibly vascular Cerebrovascular disease  1.  Since it sounds like you may have had another stroke, I would recommend switching aspirin to Plavix 75mg  daily , if there is no contraindication. 2.  Optimize blood pressure control and diabetes control 3.  Continue statin therapy.  LDL goal should be less than 70. 4.  Continue Sinemet 25/100mg , 1 tablet 3 times daily (should be taken at same time each day) 5.  Recommend occupational therapy for the hands 5.  Follow up in 3 months.

## 2013-09-15 NOTE — Progress Notes (Signed)
NEUROLOGY FOLLOW UP OFFICE NOTE  Katherine Carr [id number]  HISTORY OF PRESENT ILLNESS: Katherine Carr is a 73 year old right-handed woman from nursing home with history of depression, uncontrolled diabetes, hyperlipidemia and uncontrolled hypertension who follows up for Parkinson's disease and stroke.     UPDATE: She is frustrated about the increased spasticity of her hands.  She underwent OT in the past, but not for quite some time.  She doesn't use her hands much at all and usually she wears two wrist splints all day.  She reports that about 3 weeks ago, she had an episode of garbled speech.  She said it lasted about 3 hours.  She has had elevated blood pressure over the past couple of days.  Her blood pressure a couple of days ago was reportedly 200/100.    HISTORY: She was admitted to the hospital from 10/28/12 to 10/30/12 for stroke like symptoms, presenting as confusing and leaning towards the left.  CT head revealed no acute abnormalities.  MRI of brain was unremarkable.  She was found to have a UTI.  LFTs were initially elevated and thought to be secondary to CBD.  They later normalized.  Neurological exam revealed cogwheel rigidity and pill-rolling resting tremor in left hand.  She also endorsed memory problems and visual hallucinations.  She was started on Sinemet and currently takes 25/100mg  TID but was discontinued after discharge until she can be evaluated in clinic.  She has been wheelchair-bound for two years, she says due to diabetic neuropathy.  She fell multiple times.  She also has history of cervical stenosis as well as "lumbar problems".  She notes occasional tremor in her left hand and difficulty sometimes using the hand.  She notes feeling of lightheadedness and spinning if she sits up from bed.  She denies difficulty swallowing.  She has right arm pain stemming from her shoulder.  No numbness in the right arm.  She does report history of vivid dreams but denies  anyone telling her she has symptoms consistent with REM sleep behavior disorder.  She denies memory problems.  She notes that her father had tremors.  Earlier this year, she endorsed transient episodes of slurred speech and exhibited some mild weakness of the left arm.  MRI brain revealed small acute to subacute infarct in the right basal ganglia. She had remote left basal ganglia stroke as well.  Recommended to begin ASA 81mg  daily and underwent stroke workup.  03/09/13 Carotid dopplers revealed 1-39% right ICA stenosis based on ICA/CCA ration, and 1-39% left ICA stenosis based on plaque morphology but possibly 40-59% stenosis based on ICA/CCA ration (1.86).  03/19/13 CTA of neck revealed atherosclerotic plaque in the left carotid bifurcation and left ICA bulb, but no hemodynamically significant stenosis.  03/27/13 2D Echo:  EF 55-60%  03/27/13 LABS:  Hgb A1c 8.4, LDL 125 She was started on ASA 81mg  daily  PAST MEDICAL HISTORY: Past Medical History  Diagnosis Date  . Depression   . Diabetes mellitus   . Hypertension   . Peripheral neuropathy   . Diverticulosis   . Urinary incontinence   . Herpes   . Ovarian cyst   . Fibrous breast lumps   . Wears glasses   . Fibromyalgia   . GERD (gastroesophageal reflux disease)   . COPD (chronic obstructive pulmonary disease)   . Asthma     bronchial  . Fatty liver     hx: of  . Anxiety   . Left carotid stenosis  Hx: of  . Breast cancer     "both sides" (10/29/2012)  . Osteoarthritis     Archie Endo 09/12/2007  (10/29/2012)  . H/O hiatal hernia     Archie Endo 09/12/2007  (10/29/2012)  . Migraines     Archie Endo 09/01/2001 (10/29/2012)    MEDICATIONS: Current Outpatient Prescriptions on File Prior to Visit  Medication Sig Dispense Refill  . acetaminophen (TYLENOL) 325 MG tablet Take 650 mg by mouth every 4 (four) hours as needed for moderate pain.      Marland Kitchen alum & mag hydroxide-simeth (MAALOX/MYLANTA) 200-200-20 MG/5ML suspension Take 30 mLs by mouth every  4 (four) hours as needed for indigestion.      Marland Kitchen anastrozole (ARIMIDEX) 1 MG tablet Take 1 mg by mouth daily.      . ARIPiprazole (ABILIFY) 2 MG tablet Take 2 mg by mouth daily. *takes with 5mg  tablet for 7mg  dose      . ARIPiprazole (ABILIFY) 5 MG tablet Take 5 mg by mouth at bedtime. *takes with 2mg  tablet for 7mg  dose      . bisacodyl (DULCOLAX) 10 MG suppository Place 1 suppository (10 mg total) rectally daily as needed for moderate constipation.  12 suppository  0  . carbidopa-levodopa (SINEMET) 25-100 MG per tablet Take 1 tablet by mouth 3 (three) times daily.      . cefUROXime (CEFTIN) 500 MG tablet Take 1 tablet (500 mg total) by mouth 2 (two) times daily with a meal.  10 tablet  0  . cetirizine (ZYRTEC) 5 MG tablet Take 5 mg by mouth daily as needed for allergies.       . Cholecalciferol (VITAMIN D3) 1000 UNITS CAPS Take 2,000 Units by mouth daily.      . diclofenac sodium (VOLTAREN) 1 % GEL Apply 4 g topically 3 (three) times daily as needed (joint pain).      Marland Kitchen docusate sodium 100 MG CAPS Take 100 mg by mouth 2 (two) times daily.  10 capsule  0  . DULoxetine (CYMBALTA) 60 MG capsule Take 60 mg by mouth 2 (two) times daily.      . fluticasone (FLONASE) 50 MCG/ACT nasal spray Place 2 sprays into both nostrils daily as needed for rhinitis or allergies.    2  . gabapentin (NEURONTIN) 600 MG tablet Take 600 mg by mouth 3 (three) times daily.        Marland Kitchen HYDROcodone-acetaminophen (NORCO/VICODIN) 5-325 MG per tablet Take 2 tablets by mouth every 6 (six) hours as needed.  10 tablet  0  . hydrOXYzine (ATARAX/VISTARIL) 10 MG tablet Take 1 tablet (10 mg total) by mouth every 8 (eight) hours as needed for itching.  30 tablet  0  . insulin glargine (LANTUS) 100 UNIT/ML injection Inject 100 Units into the skin 2 (two) times daily.      . insulin lispro (HUMALOG) 100 UNIT/ML injection Inject 100 Units into the skin 3 (three) times daily before meals.      Marland Kitchen ipratropium-albuterol (DUONEB) 0.5-2.5 (3)  MG/3ML SOLN Take 3 mLs by nebulization every 6 (six) hours as needed (shortness of breath).  360 mL  2  . lansoprazole (PREVACID) 30 MG capsule Take 30 mg by mouth daily.      Marland Kitchen lisinopril (PRINIVIL,ZESTRIL) 2.5 MG tablet Take 2.5 mg by mouth daily.       . methylcellulose (ARTIFICIAL TEARS) 1 % ophthalmic solution Place 2 drops into both eyes 2 (two) times daily as needed (dry eyes).      . metoprolol succinate (  TOPROL-XL) 25 MG 24 hr tablet Take 25 mg by mouth daily.        . metroNIDAZOLE (FLAGYL) 500 MG tablet Take 1 tablet (500 mg total) by mouth 3 (three) times daily.  15 tablet  0  . ondansetron (ZOFRAN) 4 MG tablet Take 4 mg by mouth every 8 (eight) hours as needed for nausea or vomiting.      . pravastatin (PRAVACHOL) 40 MG tablet Take 40 mg by mouth daily.      Orlie Dakin Sodium (SENNA S PO) Take 1 tablet by mouth 2 (two) times daily.       . temazepam (RESTORIL) 15 MG capsule Take 1 capsule (15 mg total) by mouth at bedtime as needed (for insomnia).  30 capsule  0  . Melatonin 3 MG TABS Take 3 mg by mouth at bedtime.      . [DISCONTINUED] ClonazePAM (KLONOPIN PO) Take 0.2 mg by mouth 2 (two) times daily.        . [DISCONTINUED] escitalopram (LEXAPRO) 20 MG tablet Take 10 mg by mouth. Take 3 tablets in the AM      . [DISCONTINUED] esomeprazole (NEXIUM) 40 MG capsule Take 40 mg by mouth daily before breakfast.        . [DISCONTINUED] Insulin Regular Human (HUMULIN R U-500, CONCENTRATED, [place]) Inject into the skin 3 (three) times daily. Take three times / day prn, per SS Insulin coverage       No current facility-administered medications on file prior to visit.    ALLERGIES: Allergies  Allergen Reactions  . Sertraline Hcl Hives  . Codeine Hives and Swelling  . Cyclobenzaprine Hcl Hives and Swelling  . Fentanyl     REACTION: PANIC ATTACKS  . Furosemide     REACTION: face was swollen and hand  . Gadolinium Derivatives Swelling  . Hydrocodone-Acetaminophen   . Iohexol       Code: SOB, Desc: xray dye, Onset Date: [id number]   . Levofloxacin     REACTION: PANIC ATTACK,DIARRHEA,EYES DISCOLORD  . Lorazepam Nausea And Vomiting  . Lubiprostone     REACTION: REALLY BAD NAUSEA  . Metformin Hives and Swelling  . Metoclopramide Nausea And Vomiting and Swelling  . Morphine     REACTION: itching, panic attacks  . Oxycodone Hives and Swelling  . Promethazine Hcl     REACTION: TRAMA ANDJITTERS IN MY BODY  . Rosiglitazone Hives and Swelling  . Sertraline Hcl Hives and Swelling  . Verapamil Hives and Nausea And Vomiting    FAMILY HISTORY: Family History  Problem Relation Age of Onset  . Heart disease Mother   . Heart disease Brother   . Diabetes Brother   . Arthritis Sister   . Stroke Sister   . Cirrhosis Brother   . Cancer Brother     Bladder cancer    SOCIAL HISTORY: History   Social History  . Marital Status: Divorced    Spouse Name: N/A    Number of Children: N/A  . Years of Education: N/A   Occupational History  . Not on file.   Social History Main Topics  . Smoking status: Former Smoker -- 20 years    Types: Cigarettes    Quit date: 08/15/1996  . Smokeless tobacco: Never Used     Comment: smoked 2-3 cigarettes/day  . Alcohol Use: No  . Drug Use: No  . Sexual Activity: No   Other Topics Concern  . Not on file   Social History Narrative  . No  narrative on file    REVIEW OF SYSTEMS: Constitutional: Fatigue Eyes: No visual changes, double vision, eye pain Ear, nose and throat: No hearing loss, ear pain, nasal congestion, sore throat Cardiovascular: No chest pain, palpitations Respiratory:  No shortness of breath at rest or with exertion, wheezes GastrointestinaI: No nausea, vomiting, diarrhea, abdominal pain, fecal incontinence Genitourinary:  No dysuria, urinary retention or frequency Musculoskeletal:  No neck pain, back pain Integumentary: No rash, pruritus, skin lesions Neurological: as above Psychiatric:  Depression Endocrine: Fatigue Hematologic/Lymphatic:  No anemia, purpura, petechiae. Allergic/Immunologic: no itchy/runny eyes, nasal congestion, recent allergic reactions, rashes  PHYSICAL EXAM: Filed Vitals:   09/15/13 1424  BP: 182/84  Pulse: 70  Temp: 98 F (36.7 C)  Resp: 16   General: No acute distress Head:  Normocephalic/atraumatic Neck: supple, no paraspinal tenderness, full range of motion Heart:  Regular rate and rhythm Lungs:  Clear to auscultation bilaterally Back: No paraspinal tenderness Neurological Exam: Flat affect. Alert and oriented to person, place, and time. Speech slowed but fluent and not dysarthric, language intact. Hypomimia. CN II-XII intact. Bradykinetic, resting tremor in right hand, muscle strength Left triceps 4/5, spastic hands bilaterally, with flexed wrists and almost no finger movement,. 2/5 in LEs with left foot drop. Reduced pinprick in feet and hands, reduced vibration in feet. Deep tendon reflexes absent throughout, toes downgoing. Finger to nose without tremor with minimal reach. Wheelchair-bound.  IMPRESSION: Vascular Parkinsonism Cerebrovascular disease, with possible new recent stroke or TIA Deconditioning  PLAN: 1.  It sounds like she may have had another stroke or TIA 3 weeks ago.  I would recommend switching ASA to Plavix if no contraindication. 2.  Optimize blood pressure and diabetes control 3.  Continue statin therapy (LDL goal should be less than 70) 4.  Continue Sinemet 25/100mg  three times daily 5.  OT for hands.  Due to the extensive immobility of her hands, Botox likely would not be helpful 6.  Follow up in 3 months.  Metta Clines, DO  CC:  Wenda Low, MD

## 2013-11-02 ENCOUNTER — Encounter: Payer: Self-pay | Admitting: Neurology

## 2013-12-11 ENCOUNTER — Other Ambulatory Visit: Payer: Self-pay | Admitting: Internal Medicine

## 2013-12-11 DIAGNOSIS — N6310 Unspecified lump in the right breast, unspecified quadrant: Secondary | ICD-10-CM

## 2013-12-11 DIAGNOSIS — Z9889 Other specified postprocedural states: Secondary | ICD-10-CM

## 2013-12-11 DIAGNOSIS — Z853 Personal history of malignant neoplasm of breast: Secondary | ICD-10-CM

## 2013-12-15 ENCOUNTER — Ambulatory Visit: Payer: Self-pay | Admitting: Neurology

## 2013-12-21 ENCOUNTER — Encounter: Payer: Self-pay | Admitting: Neurology

## 2013-12-21 ENCOUNTER — Ambulatory Visit (INDEPENDENT_AMBULATORY_CARE_PROVIDER_SITE_OTHER): Payer: PRIVATE HEALTH INSURANCE | Admitting: Neurology

## 2013-12-21 VITALS — BP 122/66 | HR 70 | Temp 98.0°F | Resp 16

## 2013-12-21 DIAGNOSIS — G3183 Dementia with Lewy bodies: Secondary | ICD-10-CM

## 2013-12-21 DIAGNOSIS — G20 Parkinson's disease: Secondary | ICD-10-CM

## 2013-12-21 DIAGNOSIS — F0280 Dementia in other diseases classified elsewhere without behavioral disturbance: Secondary | ICD-10-CM

## 2013-12-21 DIAGNOSIS — I679 Cerebrovascular disease, unspecified: Secondary | ICD-10-CM

## 2013-12-21 MED ORDER — RIVASTIGMINE 4.6 MG/24HR TD PT24: 4.6000 mg | MEDICATED_PATCH | Freq: Every day | TRANSDERMAL | Status: DC

## 2013-12-21 NOTE — Progress Notes (Signed)
NEUROLOGY FOLLOW UP OFFICE NOTE  Katherine Carr [id number]  HISTORY OF PRESENT ILLNESS: Katherine Carr is a 73 year old right-handed woman from nursing home with history of depression, uncontrolled diabetes, hyperlipidemia and uncontrolled hypertension who follows up for Parkinson's disease and stroke.  She is accompanied by her husband who provides some history.  UPDATE: She has been receiving some OT to help with her hand spasticity, which helps.  She is particularly concerned about hallucinations.  She says she sometimes sees people, babies, and creatures.  She knows they aren't real but it is disturbing to her.  She also has paranoid behavior, such as thinking that people are talking about her or they are taking her candy.  She also reports that she sleeps often during the day and it is sometimes difficult for people to wake her.  HISTORY: She was admitted to the hospital from 10/28/12 to 10/30/12 for stroke like symptoms, presenting as confusing and leaning towards the left.  CT head revealed no acute abnormalities.  MRI of brain was unremarkable.  She was found to have a UTI.  LFTs were initially elevated and thought to be secondary to CBD.  They later normalized.  Neurological exam revealed cogwheel rigidity and pill-rolling resting tremor in left hand.  She also endorsed memory problems and visual hallucinations.  She was started on Sinemet and currently takes 25/100mg  TID but was discontinued after discharge until she can be evaluated in clinic.  She has been wheelchair-bound for two years, she says due to diabetic neuropathy.  She fell multiple times.  She also has history of cervical stenosis as well as "lumbar problems".  She notes occasional tremor in her left hand and difficulty sometimes using the hand.  She notes feeling of lightheadedness and spinning if she sits up from bed.  She denies difficulty swallowing.  She has right arm pain stemming from her shoulder.  No numbness in  the right arm.  She does report history of vivid dreams but denies anyone telling her she has symptoms consistent with REM sleep behavior disorder.  She denies memory problems.  She notes that her father had tremors.  Earlier this year, she endorsed transient episodes of slurred speech and exhibited some mild weakness of the left arm.  MRI brain revealed small acute to subacute infarct in the right basal ganglia. She had remote left basal ganglia stroke as well.  Recommended to begin ASA 81mg  daily and underwent stroke workup.  In August, she had an episode of garbled speech.  She said it lasted about 3 hours.  She has had elevated blood pressure over the past couple of days.  Her blood pressure a couple of days ago was reportedly 200/100.  It was advised to switch from ASA to Plavix for secondary stroke prevention.  03/09/13 Carotid dopplers revealed 1-39% right ICA stenosis based on ICA/CCA ration, and 1-39% left ICA stenosis based on plaque morphology but possibly 40-59% stenosis based on ICA/CCA ration (1.86).  03/19/13 CTA of neck revealed atherosclerotic plaque in the left carotid bifurcation and left ICA bulb, but no hemodynamically significant stenosis.  03/27/13 2D Echo:  EF 55-60%  03/27/13 LABS:  Hgb A1c 8.4, LDL 125  PAST MEDICAL HISTORY: Past Medical History  Diagnosis Date  . Depression   . Diabetes mellitus   . Hypertension   . Peripheral neuropathy   . Diverticulosis   . Urinary incontinence   . Herpes   . Ovarian cyst   . Fibrous breast lumps   .  Wears glasses   . Fibromyalgia   . GERD (gastroesophageal reflux disease)   . COPD (chronic obstructive pulmonary disease)   . Asthma     bronchial  . Fatty liver     hx: of  . Anxiety   . Left carotid stenosis     Hx: of  . Breast cancer     "both sides" (10/29/2012)  . Osteoarthritis     Archie Endo 09/12/2007  (10/29/2012)  . H/O hiatal hernia     Archie Endo 09/12/2007  (10/29/2012)  . Migraines     Archie Endo 09/01/2001 (10/29/2012)      MEDICATIONS: Current Outpatient Prescriptions on File Prior to Visit  Medication Sig Dispense Refill  . acetaminophen (TYLENOL) 325 MG tablet Take 650 mg by mouth every 4 (four) hours as needed for moderate pain.    Marland Kitchen ALPRAZolam (XANAX) 0.5 MG tablet Take 0.5 mg by mouth at bedtime as needed for anxiety.    Marland Kitchen alum & mag hydroxide-simeth (MAALOX/MYLANTA) 200-200-20 MG/5ML suspension Take 30 mLs by mouth every 4 (four) hours as needed for indigestion.    Marland Kitchen anastrozole (ARIMIDEX) 1 MG tablet Take 1 mg by mouth daily.    . ARIPiprazole (ABILIFY) 2 MG tablet Take 2 mg by mouth daily. *takes with 5mg  tablet for 7mg  dose    . ARIPiprazole (ABILIFY) 5 MG tablet Take 5 mg by mouth at bedtime. *takes with 2mg  tablet for 7mg  dose    . bisacodyl (DULCOLAX) 10 MG suppository Place 1 suppository (10 mg total) rectally daily as needed for moderate constipation. 12 suppository 0  . carbidopa-levodopa (SINEMET) 25-100 MG per tablet Take 1 tablet by mouth 3 (three) times daily.    . cefUROXime (CEFTIN) 500 MG tablet Take 1 tablet (500 mg total) by mouth 2 (two) times daily with a meal. 10 tablet 0  . cetirizine (ZYRTEC) 5 MG tablet Take 5 mg by mouth daily as needed for allergies.     . Cholecalciferol (VITAMIN D3) 1000 UNITS CAPS Take 2,000 Units by mouth daily.    . diclofenac sodium (VOLTAREN) 1 % GEL Apply 4 g topically 3 (three) times daily as needed (joint pain).    Marland Kitchen docusate sodium 100 MG CAPS Take 100 mg by mouth 2 (two) times daily. 10 capsule 0  . DULoxetine (CYMBALTA) 60 MG capsule Take 60 mg by mouth 2 (two) times daily.    . fluticasone (FLONASE) 50 MCG/ACT nasal spray Place 2 sprays into both nostrils daily as needed for rhinitis or allergies.  2  . gabapentin (NEURONTIN) 600 MG tablet Take 600 mg by mouth 3 (three) times daily.      Marland Kitchen HYDROcodone-acetaminophen (NORCO/VICODIN) 5-325 MG per tablet Take 2 tablets by mouth every 6 (six) hours as needed. 10 tablet 0  . hydrOXYzine  (ATARAX/VISTARIL) 10 MG tablet Take 1 tablet (10 mg total) by mouth every 8 (eight) hours as needed for itching. 30 tablet 0  . insulin glargine (LANTUS) 100 UNIT/ML injection Inject 100 Units into the skin 2 (two) times daily.    . insulin lispro (HUMALOG) 100 UNIT/ML injection Inject 100 Units into the skin 3 (three) times daily before meals.    Marland Kitchen ipratropium-albuterol (DUONEB) 0.5-2.5 (3) MG/3ML SOLN Take 3 mLs by nebulization every 6 (six) hours as needed (shortness of breath). 360 mL 2  . lansoprazole (PREVACID) 30 MG capsule Take 30 mg by mouth daily.    Marland Kitchen lisinopril (PRINIVIL,ZESTRIL) 2.5 MG tablet Take 2.5 mg by mouth daily.     Marland Kitchen  Melatonin 3 MG TABS Take 3 mg by mouth at bedtime.    . methylcellulose (ARTIFICIAL TEARS) 1 % ophthalmic solution Place 2 drops into both eyes 2 (two) times daily as needed (dry eyes).    . metoprolol succinate (TOPROL-XL) 25 MG 24 hr tablet Take 25 mg by mouth daily.      . metroNIDAZOLE (FLAGYL) 500 MG tablet Take 1 tablet (500 mg total) by mouth 3 (three) times daily. 15 tablet 0  . ondansetron (ZOFRAN) 4 MG tablet Take 4 mg by mouth every 8 (eight) hours as needed for nausea or vomiting.    . pravastatin (PRAVACHOL) 40 MG tablet Take 40 mg by mouth daily.    Orlie Dakin Sodium (SENNA S PO) Take 1 tablet by mouth 2 (two) times daily.     . temazepam (RESTORIL) 15 MG capsule Take 1 capsule (15 mg total) by mouth at bedtime as needed (for insomnia). 30 capsule 0  . [DISCONTINUED] ClonazePAM (KLONOPIN PO) Take 0.2 mg by mouth 2 (two) times daily.      . [DISCONTINUED] escitalopram (LEXAPRO) 20 MG tablet Take 10 mg by mouth. Take 3 tablets in the AM    . [DISCONTINUED] esomeprazole (NEXIUM) 40 MG capsule Take 40 mg by mouth daily before breakfast.      . [DISCONTINUED] Insulin Regular Human (HUMULIN R U-500, CONCENTRATED, [place]) Inject into the skin 3 (three) times daily. Take three times / day prn, per SS Insulin coverage     No current  facility-administered medications on file prior to visit.    ALLERGIES: Allergies  Allergen Reactions  . Sertraline Hcl Hives  . Codeine Hives and Swelling  . Cyclobenzaprine Hcl Hives and Swelling  . Fentanyl     REACTION: PANIC ATTACKS  . Furosemide     REACTION: face was swollen and hand  . Gadolinium Derivatives Swelling  . Hydrocodone-Acetaminophen   . Iohexol      Code: SOB, Desc: xray dye, Onset Date: [id number]   . Levofloxacin     REACTION: PANIC ATTACK,DIARRHEA,EYES DISCOLORD  . Lorazepam Nausea And Vomiting  . Lubiprostone     REACTION: REALLY BAD NAUSEA  . Metformin Hives and Swelling  . Metoclopramide Nausea And Vomiting and Swelling  . Morphine     REACTION: itching, panic attacks  . Oxycodone Hives and Swelling  . Promethazine Hcl     REACTION: TRAMA ANDJITTERS IN MY BODY  . Rosiglitazone Hives and Swelling  . Sertraline Hcl Hives and Swelling  . Verapamil Hives and Nausea And Vomiting    FAMILY HISTORY: Family History  Problem Relation Age of Onset  . Heart disease Mother   . Heart disease Brother   . Diabetes Brother   . Arthritis Sister   . Stroke Sister   . Cirrhosis Brother   . Cancer Brother     Bladder cancer    SOCIAL HISTORY: History   Social History  . Marital Status: Divorced    Spouse Name: N/A    Number of Children: N/A  . Years of Education: N/A   Occupational History  . Not on file.   Social History Main Topics  . Smoking status: Former Smoker -- 20 years    Types: Cigarettes    Quit date: 08/15/1996  . Smokeless tobacco: Never Used     Comment: smoked 2-3 cigarettes/day  . Alcohol Use: No  . Drug Use: No  . Sexual Activity: No   Other Topics Concern  . Not on file   Social  History Narrative    REVIEW OF SYSTEMS: Constitutional: No fevers, chills, or sweats, no generalized fatigue, change in appetite Eyes: No visual changes, double vision, eye pain Ear, nose and throat: No hearing loss, ear pain, nasal  congestion, sore throat Cardiovascular: No chest pain, palpitations Respiratory:  No shortness of breath at rest or with exertion, wheezes GastrointestinaI: No nausea, vomiting, diarrhea, abdominal pain, fecal incontinence Genitourinary:  No dysuria, urinary retention or frequency Musculoskeletal:  No neck pain, back pain Integumentary: No rash, pruritus, skin lesions Neurological: as above Psychiatric: Hallucinations. Endocrine: No palpitations, fatigue, diaphoresis, mood swings, change in appetite, change in weight, increased thirst Hematologic/Lymphatic:  No anemia, purpura, petechiae. Allergic/Immunologic: no itchy/runny eyes, nasal congestion, recent allergic reactions, rashes  PHYSICAL EXAM: Filed Vitals:   12/21/13 1455  BP: 122/66  Pulse: 70  Temp: 98 F (36.7 C)  Resp: 16   General: No acute distress Head:  Normocephalic/atraumatic Eyes:  Fundoscopic exam unremarkable without vessel changes, exudates, hemorrhages or papilledema. Neck: supple, no paraspinal tenderness, full range of motion Heart:  Regular rate and rhythm Lungs:  Clear to auscultation bilaterally Back: No paraspinal tenderness Neurological Exam: Flat affect. Alert and oriented to person, place, and time. Speech slowed but fluent and not dysarthric, language intact. Hypomimia. CN II-XII intact. Bradykinetic, resting tremor in right hand, muscle strength Left triceps 4/5, spastic hands bilaterally, with flexed wrists and almost no finger movement,. 2/5 in LEs with left foot drop. Deep tendon reflexes absent throughout. Finger to nose without tremor with minimal reach. Wheelchair-bound.  IMPRESSION: Dementia with Lewy Body Disease Parkinson's disease Cerebrovascular disease  PLAN: 1.  We will start Exelon patch 4.6mg .  Instructed to call in 4 weeks  and we can prescribe the 9.5mg  patch.  Consider seroquel for hallucinations, however I want to start Exelon first (would not want to start two strong medications  at the same time) 2.  Consider switching from aspirin to Plavix 75mg  daily for secondary stroke prevention. 3.  Continue PT/OT 4.  Continue Sinemet 25/100mg  three times daily. 5.  Follow up in 3 months.  Metta Clines, DO  CC:  Wenda Low, MD

## 2013-12-21 NOTE — Patient Instructions (Signed)
1.  We will start Exelon patch 4.6mg .  Apply to skin and switch every 24 hours.  If tolerating in 4 weeks, call and we can prescribe the 9.5mg  patch. 2.  Consider switching from aspirin to Plavix 75mg  daily for secondary stroke prevention. 3.  Continue PT/OT 4.  Follow up in 3 months.

## 2013-12-24 ENCOUNTER — Ambulatory Visit
Admission: RE | Admit: 2013-12-24 | Discharge: 2013-12-24 | Disposition: A | Discharge status: Home / Self Care | Payer: PRIVATE HEALTH INSURANCE | Source: Ambulatory Visit | Attending: Internal Medicine | Admitting: Internal Medicine

## 2013-12-24 DIAGNOSIS — Z9889 Other specified postprocedural states: Secondary | ICD-10-CM

## 2013-12-24 DIAGNOSIS — N6310 Unspecified lump in the right breast, unspecified quadrant: Secondary | ICD-10-CM

## 2013-12-24 DIAGNOSIS — Z853 Personal history of malignant neoplasm of breast: Secondary | ICD-10-CM

## 2014-01-13 ENCOUNTER — Telehealth: Payer: Self-pay | Admitting: *Deleted

## 2014-01-13 NOTE — Telephone Encounter (Signed)
I called to check on patient  per Dr Vinson Moselle note to see if she was tolerating the Exelon patch 4.6mg  ok and if we needed to increase the strength . Per Stephenie Acres it was not in the [facility] but it would be as of today and they would activate it asap she could not give me a reason why this did not get taken care of

## 2014-03-22 ENCOUNTER — Ambulatory Visit: Payer: Self-pay | Admitting: Neurology

## 2014-03-23 ENCOUNTER — Encounter: Payer: Self-pay | Admitting: Neurology

## 2014-03-23 ENCOUNTER — Telehealth: Payer: Self-pay | Admitting: *Deleted

## 2014-03-23 ENCOUNTER — Ambulatory Visit (INDEPENDENT_AMBULATORY_CARE_PROVIDER_SITE_OTHER): Payer: Medicare Other | Admitting: Neurology

## 2014-03-23 VITALS — BP 140/68 | HR 68 | Temp 98.2°F | Resp 18 | Ht 64.0 in | Wt 184.0 lb

## 2014-03-23 DIAGNOSIS — F0150 Vascular dementia without behavioral disturbance: Secondary | ICD-10-CM

## 2014-03-23 DIAGNOSIS — G214 Vascular parkinsonism: Secondary | ICD-10-CM

## 2014-03-23 NOTE — Telephone Encounter (Signed)
Patients spouse called requesting a letter stating patient would never be able to walk or feed herself again .

## 2014-03-23 NOTE — Patient Instructions (Addendum)
At this point, I think that the cognitive impairment and parkinsonism is related to prior strokes rather than an underlying neurodegenerative disease.  Hallucinations seem more likely related to UTI or medication effect, as it improved after discontinuation of some of her medications.   1.  Therefore, I would discontinue Exelon patch and Sinemet. 2.  I think she should continue the both wrist and hand brace (or a washcloth rolled up in her hands to prevent complete closure of grip) 3.  Continue Plavix for secondary stroke prevention 4.  Follow up in 6 months.

## 2014-03-23 NOTE — Progress Notes (Signed)
NEUROLOGY FOLLOW UP OFFICE NOTE  Katherine Carr [id number]  HISTORY OF PRESENT ILLNESS: Katherine Carr is a 74 year old right-handed woman from nursing home with history of depression, uncontrolled diabetes, hyperlipidemia and uncontrolled hypertension who follows up for probable vascular dementia and parkinsonism.  On repeat questioning, she is accompanied by her ex-husband who provides some history.  UPDATE: She has not noticed improvement on Sinemet.  Hallucinations have somewhat improved after discontinuation of some of her medications.  She also was found and treated for a UTI.  She reports that most of her parkinsonism started after her stroke.    HISTORY: While in the hospital for stroke workup in October 2014, neurological exam revealed cogwheel rigidity and pill-rolling resting tremor in left hand.  She also endorsed memory problems and visual hallucinations.  She was started on Sinemet and currently takes 25/100mg  TID.  She also endorses hallucinations.  She says she sometimes sees people, babies, and creatures.  She knows they aren't real but it is disturbing to her.  She also has paranoid behavior, such as thinking that people are talking about her or they are taking her candy.  She also reports that she sleeps often during the day and it is sometimes difficult for people to wake her.  She does report history of vivid dreams but denies anyone telling her she has symptoms consistent with REM sleep behavior disorder.  She denies memory problems.  She notes that her father had tremors.  She has history of multiple stroke-like events.  She was admitted to the hospital from 10/28/12 to 10/30/12 for stroke like symptoms, presenting as confusing and leaning towards the left.  CT head revealed no acute abnormalities.  MRI of brain was unremarkable.  She was found to have a UTI.  LFTs were initially elevated and thought to be secondary to CBD.  They later normalized.  Earlier 2015, she  endorsed transient episodes of slurred speech and exhibited some mild weakness of the left arm.  MRI brain revealed small acute to subacute infarct in the right basal ganglia. She had remote left basal ganglia stroke as well.  Recommended to begin ASA 81mg  daily and underwent stroke workup. In August 2015, she had an episode of garbled speech.  She said it lasted about 3 hours.  She has had elevated blood pressure over the past couple of days.  Her blood pressure a couple of days ago was reportedly 200/100.  It was advised to switch from ASA to Plavix for secondary stroke prevention.  She has been wheelchair-bound for two years, she says due to diabetic neuropathy.  She fell multiple times.  She also has history of cervical stenosis as well as "lumbar problems".  She notes occasional tremor in her left hand and difficulty sometimes using the hand.  She notes feeling of lightheadedness and spinning if she sits up from bed.  She denies difficulty swallowing.  She has right arm pain stemming from her shoulder.  No numbness in the right arm.    PAST MEDICAL HISTORY: Past Medical History  Diagnosis Date  . Depression   . Diabetes mellitus   . Hypertension   . Peripheral neuropathy   . Diverticulosis   . Urinary incontinence   . Herpes   . Ovarian cyst   . Fibrous breast lumps   . Wears glasses   . Fibromyalgia   . GERD (gastroesophageal reflux disease)   . COPD (chronic obstructive pulmonary disease)   . Asthma  bronchial  . Fatty liver     hx: of  . Anxiety   . Left carotid stenosis     Hx: of  . Breast cancer     "both sides" (10/29/2012)  . Osteoarthritis     Archie Endo 09/12/2007  (10/29/2012)  . H/O hiatal hernia     Archie Endo 09/12/2007  (10/29/2012)  . Migraines     Archie Endo 09/01/2001 (10/29/2012)    MEDICATIONS: Current Outpatient Prescriptions on File Prior to Visit  Medication Sig Dispense Refill  . acetaminophen (TYLENOL) 325 MG tablet Take 650 mg by mouth every 4 (four) hours  as needed for moderate pain.    Marland Kitchen ALPRAZolam (XANAX) 0.5 MG tablet Take 0.5 mg by mouth at bedtime as needed for anxiety.    Marland Kitchen alum & mag hydroxide-simeth (MAALOX/MYLANTA) 200-200-20 MG/5ML suspension Take 30 mLs by mouth every 4 (four) hours as needed for indigestion.    Marland Kitchen anastrozole (ARIMIDEX) 1 MG tablet Take 1 mg by mouth daily.    . ARIPiprazole (ABILIFY) 2 MG tablet Take 2 mg by mouth daily. *takes with 5mg  tablet for 7mg  dose    . ARIPiprazole (ABILIFY) 5 MG tablet Take 5 mg by mouth at bedtime. *takes with 2mg  tablet for 7mg  dose    . bisacodyl (DULCOLAX) 10 MG suppository Place 1 suppository (10 mg total) rectally daily as needed for moderate constipation. 12 suppository 0  . carbidopa-levodopa (SINEMET) 25-100 MG per tablet Take 1 tablet by mouth 3 (three) times daily.    . cefUROXime (CEFTIN) 500 MG tablet Take 1 tablet (500 mg total) by mouth 2 (two) times daily with a meal. 10 tablet 0  . cetirizine (ZYRTEC) 5 MG tablet Take 5 mg by mouth daily as needed for allergies.     . Cholecalciferol (VITAMIN D3) 1000 UNITS CAPS Take 2,000 Units by mouth daily.    . diclofenac sodium (VOLTAREN) 1 % GEL Apply 4 g topically 3 (three) times daily as needed (joint pain).    Marland Kitchen docusate sodium 100 MG CAPS Take 100 mg by mouth 2 (two) times daily. 10 capsule 0  . DULoxetine (CYMBALTA) 60 MG capsule Take 60 mg by mouth 2 (two) times daily.    . fluticasone (FLONASE) 50 MCG/ACT nasal spray Place 2 sprays into both nostrils daily as needed for rhinitis or allergies.  2  . gabapentin (NEURONTIN) 600 MG tablet Take 600 mg by mouth 3 (three) times daily.      Marland Kitchen HYDROcodone-acetaminophen (NORCO/VICODIN) 5-325 MG per tablet Take 2 tablets by mouth every 6 (six) hours as needed. 10 tablet 0  . hydrOXYzine (ATARAX/VISTARIL) 10 MG tablet Take 1 tablet (10 mg total) by mouth every 8 (eight) hours as needed for itching. 30 tablet 0  . insulin glargine (LANTUS) 100 UNIT/ML injection Inject 100 Units into the skin 2  (two) times daily.    . insulin lispro (HUMALOG) 100 UNIT/ML injection Inject 100 Units into the skin 3 (three) times daily before meals.    Marland Kitchen ipratropium-albuterol (DUONEB) 0.5-2.5 (3) MG/3ML SOLN Take 3 mLs by nebulization every 6 (six) hours as needed (shortness of breath). 360 mL 2  . lansoprazole (PREVACID) 30 MG capsule Take 30 mg by mouth daily.    Marland Kitchen lisinopril (PRINIVIL,ZESTRIL) 2.5 MG tablet Take 2.5 mg by mouth daily.     . Melatonin 3 MG TABS Take 3 mg by mouth at bedtime.    . methylcellulose (ARTIFICIAL TEARS) 1 % ophthalmic solution Place 2 drops into both eyes 2 (  two) times daily as needed (dry eyes).    . metoprolol succinate (TOPROL-XL) 25 MG 24 hr tablet Take 25 mg by mouth daily.      . metroNIDAZOLE (FLAGYL) 500 MG tablet Take 1 tablet (500 mg total) by mouth 3 (three) times daily. 15 tablet 0  . ondansetron (ZOFRAN) 4 MG tablet Take 4 mg by mouth every 8 (eight) hours as needed for nausea or vomiting.    . pravastatin (PRAVACHOL) 40 MG tablet Take 40 mg by mouth daily.    . rivastigmine (EXELON) 4.6 mg/24hr Place 1 patch (4.6 mg total) onto the skin daily. 30 patch 0  . Sennosides-Docusate Sodium (SENNA S PO) Take 1 tablet by mouth 2 (two) times daily.     . temazepam (RESTORIL) 15 MG capsule Take 1 capsule (15 mg total) by mouth at bedtime as needed (for insomnia). 30 capsule 0  . [DISCONTINUED] ClonazePAM (KLONOPIN PO) Take 0.2 mg by mouth 2 (two) times daily.      . [DISCONTINUED] escitalopram (LEXAPRO) 20 MG tablet Take 10 mg by mouth. Take 3 tablets in the AM    . [DISCONTINUED] esomeprazole (NEXIUM) 40 MG capsule Take 40 mg by mouth daily before breakfast.      . [DISCONTINUED] Insulin Regular Human (HUMULIN R U-500, CONCENTRATED, [place]) Inject into the skin 3 (three) times daily. Take three times / day prn, per SS Insulin coverage     No current facility-administered medications on file prior to visit.    ALLERGIES: Allergies  Allergen Reactions  . Sertraline Hcl  Hives  . Codeine Hives and Swelling  . Cyclobenzaprine Hcl Hives and Swelling  . Fentanyl     REACTION: PANIC ATTACKS  . Furosemide     REACTION: face was swollen and hand  . Gadolinium Derivatives Swelling  . Hydrocodone-Acetaminophen   . Iohexol      Code: SOB, Desc: xray dye, Onset Date: [id number]   . Levofloxacin     REACTION: PANIC ATTACK,DIARRHEA,EYES DISCOLORD  . Lorazepam Nausea And Vomiting  . Lubiprostone     REACTION: REALLY BAD NAUSEA  . Metformin Hives and Swelling  . Metoclopramide Nausea And Vomiting and Swelling  . Morphine     REACTION: itching, panic attacks  . Oxycodone Hives and Swelling  . Promethazine Hcl     REACTION: TRAMA ANDJITTERS IN MY BODY  . Rosiglitazone Hives and Swelling  . Sertraline Hcl Hives and Swelling  . Verapamil Hives and Nausea And Vomiting    FAMILY HISTORY: Family History  Problem Relation Age of Onset  . Heart disease Mother   . Heart disease Brother   . Diabetes Brother   . Arthritis Sister   . Stroke Sister   . Cirrhosis Brother   . Cancer Brother     Bladder cancer    SOCIAL HISTORY: History   Social History  . Marital Status: Divorced    Spouse Name: N/A  . Number of Children: N/A  . Years of Education: N/A   Occupational History  . Not on file.   Social History Main Topics  . Smoking status: Former Smoker -- 20 years    Types: Cigarettes    Quit date: 08/15/1996  . Smokeless tobacco: Never Used     Comment: smoked 2-3 cigarettes/day  . Alcohol Use: No  . Drug Use: No  . Sexual Activity: No   Other Topics Concern  . Not on file   Social History Narrative    REVIEW OF SYSTEMS: Constitutional: No fevers,  chills, or sweats, no generalized fatigue, change in appetite Eyes: No visual changes, double vision, eye pain Ear, nose and throat: No hearing loss, ear pain, nasal congestion, sore throat Cardiovascular: No chest pain, palpitations Respiratory:  No shortness of breath at rest or with exertion,  wheezes GastrointestinaI: No nausea, vomiting, diarrhea, abdominal pain, fecal incontinence Genitourinary:  No dysuria, urinary retention or frequency Musculoskeletal:  No neck pain, back pain Integumentary: No rash, pruritus, skin lesions Neurological: as above Psychiatric: No depression, insomnia, anxiety Endocrine: No palpitations, fatigue, diaphoresis, mood swings, change in appetite, change in weight, increased thirst Hematologic/Lymphatic:  No anemia, purpura, petechiae. Allergic/Immunologic: no itchy/runny eyes, nasal congestion, recent allergic reactions, rashes  PHYSICAL EXAM: Filed Vitals:   03/23/14 0929  BP: 140/68  Pulse: 68  Temp: 98.2 F (36.8 C)  Resp: 18   General: No acute distress Head:  Normocephalic/atraumatic Eyes:  Fundoscopic exam unremarkable without vessel changes, exudates, hemorrhages or papilledema. Neck: supple, no paraspinal tenderness, full range of motion Heart:  Regular rate and rhythm Lungs:  Clear to auscultation bilaterally Back: No paraspinal tenderness Neurological Exam: Flat affect. Alert and oriented to person, place, and time. Speech slowed but fluent and not dysarthric, language intact. Hypomimia. Attention, concentration and fund of knowledge intact.  Remote and recent memory intact.  CN II-XII intact. Bradykinetic, resting tremor in right hand, muscle strength Left triceps 4/5, spastic hands bilaterally, with flexed wrists and almost no finger movement,. 2/5 in LEs with left foot drop. Deep tendon reflexes absent throughout. Finger to nose without tremor with minimal reach. Wheelchair-bound.  IMPRESSION: Probable vascular dementia and parkinsonism.  Given that symptoms really have been prominent since her stroke, and the fact that the hallucinations could be explained by other factors, I now feel a neurodegenerative disease is less likely.  Parkinsonism may be partly medication-related as well.  PLAN: 1.  Therefore, we will discontinue  Exelon and Sinemet, as they would not likely be effective and side effects will likely outweigh benefits 2.  Recommend wrist and hand braces  3.  Plavix for secondary stroke prevention. 4.  Follow up in 6 months.  30 minutes spent with patient, over 50% spent discussing possible diagnoses and management.  Metta Clines, DO  CC:  Wenda Low, MD

## 2014-03-24 ENCOUNTER — Encounter: Payer: Self-pay | Admitting: Neurology

## 2014-03-24 NOTE — Telephone Encounter (Signed)
Letter is drafted.

## 2014-03-24 NOTE — Telephone Encounter (Signed)
Patients spouse is aware that letter has been drafted and at the front desk

## 2014-04-06 ENCOUNTER — Telehealth: Payer: Self-pay | Admitting: Neurology

## 2014-04-06 NOTE — Telephone Encounter (Signed)
I advised patient she needs to wear wrist splints as directed

## 2014-04-06 NOTE — Telephone Encounter (Signed)
Pt wants to know how long to wear the braces for please call [phone]

## 2014-04-08 ENCOUNTER — Ambulatory Visit: Payer: Self-pay | Admitting: Oncology

## 2014-05-11 ENCOUNTER — Other Ambulatory Visit: Payer: Self-pay | Admitting: Internal Medicine

## 2014-05-11 DIAGNOSIS — N63 Unspecified lump in unspecified breast: Secondary | ICD-10-CM

## 2014-05-14 ENCOUNTER — Ambulatory Visit
Admission: RE | Admit: 2014-05-14 | Discharge: 2014-05-14 | Disposition: A | Discharge status: Home / Self Care | Payer: Medicare Other | Source: Ambulatory Visit | Attending: Internal Medicine | Admitting: Internal Medicine

## 2014-05-14 DIAGNOSIS — N63 Unspecified lump in unspecified breast: Secondary | ICD-10-CM

## 2014-05-17 ENCOUNTER — Telehealth: Payer: Self-pay | Admitting: Oncology

## 2014-05-17 NOTE — Telephone Encounter (Signed)
Confirmed appointment for 05/24 with Jeani Hawking at [facility].

## 2014-05-25 ENCOUNTER — Telehealth: Payer: Self-pay | Admitting: Nurse Practitioner

## 2014-05-25 ENCOUNTER — Ambulatory Visit (HOSPITAL_BASED_OUTPATIENT_CLINIC_OR_DEPARTMENT_OTHER): Payer: Medicare Other | Admitting: Oncology

## 2014-05-25 VITALS — BP 149/54 | HR 61 | Temp 98.2°F | Resp 18

## 2014-05-25 DIAGNOSIS — C50211 Malignant neoplasm of upper-inner quadrant of right female breast: Secondary | ICD-10-CM

## 2014-05-25 DIAGNOSIS — M8580 Other specified disorders of bone density and structure, unspecified site: Secondary | ICD-10-CM

## 2014-05-25 DIAGNOSIS — D0511 Intraductal carcinoma in situ of right breast: Secondary | ICD-10-CM

## 2014-05-25 NOTE — Telephone Encounter (Signed)
Appointments made and avs printed for patient °

## 2014-05-25 NOTE — Progress Notes (Signed)
[name]  [phone] [phone]) [phone]  MEDICAL ONCOLOGY NEW PATIENT EVALUATION    ID: Katherine Carr   DOB: 1940-07-20  MR#: [id number]  VOH#:[id number]   PCP: Wenda Low, MD SU: Fanny Skates, M.D. OTHER MD: Dr. Buddy Duty, Dr. Adele Schilder, Dr. Zadie Rhine, Dr. Ellene Route, Dr. Leo Grosser, Dr. Penelope Coop, Dr Tomi Likens    HISTORY OF PRESENT ILLNESS: From the original intake note:  The patient states that she felt a "knot" in her right breast in 02/2012 and requested to have a mammogram.  The patient had a unilateral right digital diagnostic mammogram on 03/06/2012 which showed a subtle superficial focal density over the region of the patient's palpable abnormality.  The remainder of the exam was unchanged.  The patient underwent a right breast ultrasound on the same day which showed a 4 x 8 x 8 mm intraductal mass within the superficial duct at the 2:00 position of the right periareolar region  2 cm from the nipple corresponding to patient's palpable abnormality.  The patient had a right needle core breast biopsy at the 2:00 position, 2 cm from the nipple on 03/06/2012 which showed intraductal papilloma with usual ductal hyperplasia.  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  Ductal carcinoma in situ measures 0.8 cm in greatest dimension.  The tumor involves cauterized posterior margin age.  Other margins are negative.  ER 98% positive, stage 0, pTis, pNX, MX.  Left abdominal wall nevus shows seborrheic keratosis no dysplasia or malignancy identified.  Her subsequent history is detailed below  INTERVAL HISTORY: Katherine Carr returns today accompanied by her (separated) husband [name] for followup of Katherine Carr'is right breast carcinoma. The interval history is stable from a breast cancer point of view. She continues on anastrozole. She  thinks she sometimes may have some hot flashes related to this. She just had her yearly mammogram with tomography. This was benign.  REVIEW OF SYSTEMS: Katherine Carr has some vaginal burning. She is frequently constipated. Of course she is wheelchair-bound. She does participate [place] in activities and does not describe herself as board. She denies pain. A detailed review of systems today was stable  PAST MEDICAL HISTORY: Past Medical History  Diagnosis Date  . Depression   . Diabetes mellitus   . Hypertension   . Peripheral neuropathy   . Diverticulosis   . Urinary incontinence   . Herpes   . Ovarian cyst   . Fibrous breast lumps   . Wears glasses   . Fibromyalgia   . GERD (gastroesophageal reflux disease)   . COPD (chronic obstructive pulmonary disease)   . Asthma     bronchial  . Fatty liver     hx: of  . Anxiety   . Left carotid stenosis     Hx: of  . Breast cancer     "both sides" (10/29/2012)  . Osteoarthritis     Archie Endo 09/12/2007  (10/29/2012)  . H/O hiatal hernia     Archie Endo 09/12/2007  (10/29/2012)  . Migraines     Archie Endo 09/01/2001 (10/29/2012)    PAST SURGICAL HISTORY: Past Surgical History  Procedure Laterality Date  . Carpal tunnel release Bilateral   . Cesarean section    . Abdominal hysterectomy    . Myomectomy    . Breast lumpectomy with needle localization Right 04/02/2012    Procedure: Excision Ductal System Right Breast with Needle Localization;  Surgeon: Renelda Loma  Alyssa Grove, MD;  Location: [facility];  Service: General;  Laterality: Right;  needle localization at 7:30 at [facility]   . Nevus excision Left 04/02/2012    Procedure: Excision Nevus Left Abdominal Wall;  Surgeon: Adin Hector, MD;  Location: [facility];  Service: General;  Laterality: Left;  . Breast lumpectomy Right 04/21/2012    Procedure: Right breast LUMPECTOMY with re-excision of margins;  Surgeon: Adin Hector, MD;  Location: [facility];  Service: General;  Laterality: Right;   . Tonsillectomy    . Appendectomy    . Breast ductal system excision Left     Archie Endo 04/02/2012  (10/29/2012)  . Back surgery      central decompression L5-S1/notes 10/23/2005 (10/29/2012)  . Anterior cervical decomp/discectomy fusion  09/2003    Archie Endo 09/25/2003 (10/29/2012)    FAMILY HISTORY Family History  Problem Relation Age of Onset  . Heart disease Mother   . Heart disease Brother   . Diabetes Brother   . Arthritis Sister   . Stroke Sister   . Cirrhosis Brother   . Cancer Brother     Bladder cancer  She has a sister Rod Holler who has a history of having arthritis and a stroke.  She has a brother named Virgie Dad who had heart disease and diabetes.  She has another brother Benson [place] who had cirrhosis and bladder cancer  GYNECOLOGIC HISTORY: Gravida 1, para 1, age of menarche 55, first live birth at the age of 28, last menstrual period in 1974, the patient has history of estradiol hormone replacement from 2001 until this year (2014).  SOCIAL HISTORY: Katherine Carr was born in [place]) [place], [place] and got married to her first husband when she was 51 years old.  She had her first and only child with her first husband.  Her son Katherine Carr, is a Personnel officer in the [place] [employer] and stationed in [place].  She was married for a second time when she was 74 years old and married for the third and last time when she was 74 years old.  She was a Theatre manager for 30 years.  She is also an Education administrator.  She has 2 teenage granddaughters Katherine Carr that is 59 years old and Katherine Carr that is 73 years old.  They reside in [place] with her father.  Katherine Carr has lived at [facility] since 04/2010. 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     ADVANCED DIRECTIVES: The patient states that she wishes to be a DO NOT RESUSCITATE and the official document is on file with [facility].   Her medical power of attorney is Suzzanne Cloud who may be reached at [phone].  HEALTH MAINTENANCE: History  Substance Use Topics  . Smoking status: Former Smoker -- 20 years    Types: Cigarettes    Quit date: 08/15/1996  . Smokeless tobacco: Never Used     Comment: smoked 2-3 cigarettes/day  . Alcohol Use: No    Colonoscopy: 2012 PAP: 1971 Bone density: 2012 Lipid panel: Not on file  Allergies  Allergen Reactions  . Sertraline Hcl Hives  . Codeine Hives and Swelling  . Cyclobenzaprine Hcl Hives and Swelling  . Fentanyl     REACTION: PANIC ATTACKS  . Furosemide     REACTION: face was swollen and hand  . Gadolinium Derivatives Swelling  . Hydrocodone-Acetaminophen   . Iohexol  Code: SOB, Desc: xray dye, Onset Date: [id number]   . Levofloxacin     REACTION: PANIC ATTACK,DIARRHEA,EYES DISCOLORD  . Lorazepam Nausea And Vomiting  . Lubiprostone     REACTION: REALLY BAD NAUSEA  . Metformin Hives and Swelling  . Metoclopramide Nausea And Vomiting and Swelling  . Morphine     REACTION: itching, panic attacks  . Oxycodone Hives and Swelling  . Promethazine Hcl     REACTION: TRAMA ANDJITTERS IN MY BODY  . Rosiglitazone Hives and Swelling  . Sertraline Hcl Hives and Swelling  . Verapamil Hives and Nausea And Vomiting    Current Outpatient Prescriptions  Medication Sig Dispense Refill  . acetaminophen (TYLENOL) 325 MG tablet Take 650 mg by mouth every 4 (four) hours as needed for moderate pain.    Marland Kitchen ALPRAZolam (XANAX) 0.5 MG tablet Take 0.5 mg by mouth at bedtime as needed for anxiety.    Marland Kitchen alum & mag hydroxide-simeth (MAALOX/MYLANTA) 200-200-20 MG/5ML suspension Take 30 mLs by mouth every 4 (four) hours as needed for indigestion.    Marland Kitchen anastrozole (ARIMIDEX) 1 MG tablet Take 1 mg by mouth daily.    . ARIPiprazole (ABILIFY) 2 MG tablet Take 2 mg by mouth daily. *takes with 5mg  tablet for 7mg  dose    . ARIPiprazole (ABILIFY) 5 MG tablet Take 5 mg by mouth at bedtime.  *takes with 2mg  tablet for 7mg  dose    . bisacodyl (DULCOLAX) 10 MG suppository Place 1 suppository (10 mg total) rectally daily as needed for moderate constipation. 12 suppository 0  . cefUROXime (CEFTIN) 500 MG tablet Take 1 tablet (500 mg total) by mouth 2 (two) times daily with a meal. 10 tablet 0  . cetirizine (ZYRTEC) 5 MG tablet Take 5 mg by mouth daily as needed for allergies.     . Cholecalciferol (VITAMIN D3) 1000 UNITS CAPS Take 2,000 Units by mouth daily.    . diclofenac sodium (VOLTAREN) 1 % GEL Apply 4 g topically 3 (three) times daily as needed (joint pain).    Marland Kitchen docusate sodium 100 MG CAPS Take 100 mg by mouth 2 (two) times daily. 10 capsule 0  . DULoxetine (CYMBALTA) 60 MG capsule Take 60 mg by mouth 2 (two) times daily.    . fluticasone (FLONASE) 50 MCG/ACT nasal spray Place 2 sprays into both nostrils daily as needed for rhinitis or allergies.  2  . gabapentin (NEURONTIN) 600 MG tablet Take 600 mg by mouth 3 (three) times daily.      Marland Kitchen HYDROcodone-acetaminophen (NORCO/VICODIN) 5-325 MG per tablet Take 2 tablets by mouth every 6 (six) hours as needed. 10 tablet 0  . hydrOXYzine (ATARAX/VISTARIL) 10 MG tablet Take 1 tablet (10 mg total) by mouth every 8 (eight) hours as needed for itching. 30 tablet 0  . insulin glargine (LANTUS) 100 UNIT/ML injection Inject 100 Units into the skin 2 (two) times daily.    . insulin lispro (HUMALOG) 100 UNIT/ML injection Inject 100 Units into the skin 3 (three) times daily before meals.    Marland Kitchen ipratropium-albuterol (DUONEB) 0.5-2.5 (3) MG/3ML SOLN Take 3 mLs by nebulization every 6 (six) hours as needed (shortness of breath). 360 mL 2  . lansoprazole (PREVACID) 30 MG capsule Take 30 mg by mouth daily.    Marland Kitchen lisinopril (PRINIVIL,ZESTRIL) 2.5 MG tablet Take 2.5 mg by mouth daily.     Marland Kitchen loratadine (CLARITIN) 10 MG tablet Take 10 mg by mouth daily.    . Melatonin 3 MG TABS Take 3 mg by  mouth at bedtime.    . methylcellulose (ARTIFICIAL TEARS) 1 %  ophthalmic solution Place 2 drops into both eyes 2 (two) times daily as needed (dry eyes).    . metoprolol succinate (TOPROL-XL) 25 MG 24 hr tablet Take 25 mg by mouth daily.      . metroNIDAZOLE (FLAGYL) 500 MG tablet Take 1 tablet (500 mg total) by mouth 3 (three) times daily. 15 tablet 0  . ondansetron (ZOFRAN) 4 MG tablet Take 4 mg by mouth every 8 (eight) hours as needed for nausea or vomiting.    . pravastatin (PRAVACHOL) 40 MG tablet Take 40 mg by mouth daily.    . Probiotic Product (PROBIOTIC MATURE ADULT PO) Take by mouth.    Orlie Dakin Sodium (SENNA S PO) Take 1 tablet by mouth 2 (two) times daily.     . temazepam (RESTORIL) 15 MG capsule Take 1 capsule (15 mg total) by mouth at bedtime as needed (for insomnia). 30 capsule 0  . [DISCONTINUED] ClonazePAM (KLONOPIN PO) Take 0.2 mg by mouth 2 (two) times daily.      . [DISCONTINUED] escitalopram (LEXAPRO) 20 MG tablet Take 10 mg by mouth. Take 3 tablets in the AM    . [DISCONTINUED] esomeprazole (NEXIUM) 40 MG capsule Take 40 mg by mouth daily before breakfast.      . [DISCONTINUED] Insulin Regular Human (HUMULIN R U-500, CONCENTRATED, [place]) Inject into the skin 3 (three) times daily. Take three times / day prn, per SS Insulin coverage     No current facility-administered medications for this visit.    OBJECTIVE:  Elderly white woman examined in a wheelchair Filed Vitals:   05/25/14 0937  BP: 149/54  Pulse: 61  Temp: 98.2 F (36.8 C)  Resp: 18     There is no weight on file to calculate BMI.   (Pt unable to stand for weight)    ECOG: 2  Sclerae unicteric, pupils are reactive, the right slightly larger than the left Oropharynx clear and dry, no thrush or other lesions No cervical or supraclavicular adenopathy Lungs no rales or rhonchi Heart regular rate and rhythm Abd soft, obese, nontender, positive bowel sounds MSK no focal spinal tenderness Neuro: She has bilateral hand contractures; exam is previously  noted Breasts: Both breasts are status post lumpectomy. There is no evidence of local recurrence in either breast. Both axillae are benign    LAB RESULTS: Lab Results  Component Value Date   WBC 5.4 04/02/2013   NEUTROABS 3.2 04/02/2013   HGB 13.3 04/02/2013   HCT 39.0 04/02/2013   MCV 92.7 04/02/2013   PLT 212 04/02/2013      Chemistry      Component Value Date/Time   NA 140 04/02/2013 1121   NA 143 02/05/2013 0716   K 4.2 04/02/2013 1121   K 3.7 02/05/2013 0716   CL 103 02/05/2013 0716   CL 97* 05/21/2012 1432   CO2 28 04/02/2013 1121   CO2 27 02/05/2013 0716   BUN 26.6* 04/02/2013 1121   BUN 32* 02/05/2013 0716   CREATININE 0.9 04/02/2013 1121   CREATININE 0.63 02/05/2013 0716      Component Value Date/Time   CALCIUM 9.4 04/02/2013 1121   CALCIUM 8.4 02/05/2013 0716   ALKPHOS 175* 04/02/2013 1121   ALKPHOS 196* 02/05/2013 0716   AST 15 04/02/2013 1121   AST 66* 02/05/2013 0716   ALT <6 04/02/2013 1121   ALT 20 02/05/2013 0716   BILITOT 0.55 04/02/2013 1121   BILITOT  0.9 02/05/2013 0716      STUDIES: Mm Diag Breast Tomo Bilateral  05/14/2014   CLINICAL DATA:  74 year old female for annual bilateral mammograms. History of right breast cancer with lumpectomy in 2014.  EXAM: DIGITAL DIAGNOSTIC BILATERAL MAMMOGRAM WITH 3D TOMOSYNTHESIS AND CAD  COMPARISON:  04/02/2012 and prior mammograms dating back to 09/15/2008  ACR Breast Density Category b: There are scattered areas of fibroglandular density.  FINDINGS: A magnification view of the lumpectomy site and routine views of both breasts demonstrate no suspicious mass, nonsurgical distortion or worrisome calcifications.  Surgical changes/scarring within the right breast again identified.  Mammographic images were processed with CAD.  IMPRESSION: No evidence of breast malignancy.  Postsurgical changes/scarring within the right breast.  RECOMMENDATION: Bilateral diagnostic mammograms in 1 year.  Carr have discussed the findings  and recommendations with the patient. Results were also provided in writing at the conclusion of the visit. If applicable, a reminder letter will be sent to the patient regarding the next appointment.  BI-RADS CATEGORY  2: Benign.   Electronically Signed   By: Margarette Canada M.D.   On: 05/14/2014 15:38      ASSESSMENT: 74 y.o. [place], [place] woman:  1.  Status post left breast needle localized lumpectomy on 10/20/2008 which showed intraductal papillomas, focally transected at inked margin of resection, fibrocystic changes, usual ductal hyperplasia.  2.  Status post right breast needle core biopsy of the 2 o'clock position on 03/06/2012 which showed intraductal papilloma with usual ductal hyperplasia.  3.  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  Status post right breast excision on 04/21/2012 which showed intraductal papilloma, focal atypical ductal hyperplasia,  microcalcifications identified.  5.  patient declined adjuvant radiation.  6.  Began on anastrozole, 1 mg daily, in may 2014, the goal being to continue for total of 5 years  (a) DEXA scan 09/15/2012 shows osteopenia with a T score of -2.1   PLAN: Amarylis is tolerating the anastrozole well. Of course this does tend to worsen the osteopenia problem. She is on vitamin D supplementation. Since she is pretty much wheelchair bound, perhaps the osteopenia is not a so much of a concern. Accordingly Carr am leaving the decision whether to start alendronate or a similar drug to her primary care physician.  Overall the plan is to continue alendronate another 4 years.  Amberrose will see [place] again in one year. She will have her mammogram in May of next year, before the visit. We will be glad to see her on an as-needed basis if necessary before then, but  at this point there is no evidence of active disease.   Chauncey Cruel, MD  05/25/2014, 9:39 AM

## 2014-09-23 ENCOUNTER — Ambulatory Visit: Payer: Self-pay | Admitting: Neurology

## 2014-09-24 ENCOUNTER — Telehealth: Payer: Self-pay | Admitting: Neurology

## 2014-09-24 NOTE — Telephone Encounter (Signed)
lmtcb

## 2014-09-24 NOTE — Telephone Encounter (Signed)
Pt's husband called and would like a cal back in regards to wife's appointment that she could not make/Dawn CB# [phone]

## 2014-09-27 NOTE — Telephone Encounter (Signed)
Per Lenna Sciara she stated she tried calling this patient last week.  There was no answer when I called and I left message n machine to call the office back

## 2014-11-24 ENCOUNTER — Encounter: Payer: Self-pay | Admitting: Neurology

## 2014-11-24 ENCOUNTER — Ambulatory Visit (INDEPENDENT_AMBULATORY_CARE_PROVIDER_SITE_OTHER): Payer: Medicare Other | Admitting: Neurology

## 2014-11-24 VITALS — BP 120/78 | HR 56 | Resp 12

## 2014-11-24 DIAGNOSIS — R252 Cramp and spasm: Secondary | ICD-10-CM

## 2014-11-24 DIAGNOSIS — R258 Other abnormal involuntary movements: Secondary | ICD-10-CM

## 2014-11-24 DIAGNOSIS — E0842 Diabetes mellitus due to underlying condition with diabetic polyneuropathy: Secondary | ICD-10-CM | POA: Diagnosis not present

## 2014-11-24 DIAGNOSIS — G3183 Dementia with Lewy bodies: Secondary | ICD-10-CM | POA: Diagnosis not present

## 2014-11-24 DIAGNOSIS — I639 Cerebral infarction, unspecified: Secondary | ICD-10-CM | POA: Diagnosis not present

## 2014-11-24 DIAGNOSIS — F02818 Dementia in other diseases classified elsewhere, unspecified severity, with other behavioral disturbance: Secondary | ICD-10-CM

## 2014-11-24 DIAGNOSIS — F0281 Dementia in other diseases classified elsewhere with behavioral disturbance: Secondary | ICD-10-CM

## 2014-11-24 MED ORDER — CARBIDOPA-LEVODOPA 25-100 MG PO TABS
ORAL_TABLET | ORAL | Status: AC
Start: 2014-11-24 — End: ?

## 2014-11-24 NOTE — Patient Instructions (Signed)
I have been getting different history.  There was a reported clear onset of symptoms after the stroke, however she did have symptoms prior.  Also she continues to have hallucinations despite change in medication.  Therefore, Lewy Body Dementia is now strongly considered.  We will restart carbidopa-levodopa 25/100mg  tablets. 1. Start carbidopa (Sinemet) 25/100mg  tablets.  Day 1-3: Take 0.5 tablet in morning and 0.5 tablet in evening/bedtime.  Day 4-7: Take 0.5 tablet in morning, 0.5 tablet in afternoon, and 0.5 tablet in evening/bedtime.  Week 2 & thereafter: Take 1 tablet three times daily.   Take the medication at the same time everyday. The medication does not get absorbed into your body as well, if you take it with protein-containing foods (meat, dairy, beans). Try taking the medication about one hour before meals. If you experience nausea by taking it on an empty stomach, you may take it with carbohydrate-containing food,such as bread.  Side effects to look out for, include dizziness, nausea, vivid dreams and hallucinations. If you experience any of these symptoms, please call us.   2.  In one month, call our office and we can start Aricept for memory 3.  Will refer you to Dr. Tawni Levy of [facility] for evaluation for botox to help with spasticity 4.  Follow up in 6 months.

## 2014-11-24 NOTE — Progress Notes (Signed)
NEUROLOGY FOLLOW UP OFFICE NOTE  Katherine Carr [id number]  HISTORY OF PRESENT ILLNESS: Katherine Carr is a 74 year old right-handed woman from nursing home with history of depression, uncontrolled diabetes, hyperlipidemia and uncontrolled hypertension who follows up for probable vascular dementia and parkinsonism.    UPDATE: No significant change since last visit, however I am receiving conflicting information.  Although patient and husband say that the parkinsonism started after stroke, it appears that she had some symptoms of tremor prior.  Also hallucinations continue, despite prior claim that they improved after discontinuing some medication and treating UTI.  They also report that the Sinemet she was previously on, did help the tremor.  Therefore, I feel that Lewy Body Dementia is still a high probability.  She also reports shooting pain in the feet.  HISTORY: She has history of multiple stroke-like events.  She was admitted to the hospital from 10/28/12 to 10/30/12 for stroke like symptoms, presenting as confusing and leaning towards the left.  CT head revealed no acute abnormalities.  MRI of brain was unremarkable.  She was found to have a UTI.  LFTs were initially elevated and thought to be secondary to CBD.  They later normalized.  Earlier 2015, she endorsed transient episodes of slurred speech and exhibited some mild weakness of the left arm.  MRI brain revealed small acute to subacute infarct in the right basal ganglia. She had remote left basal ganglia stroke as well.  Recommended to begin ASA 81mg  daily and underwent stroke workup. In August 2015, she had an episode of garbled speech.  She said it lasted about 3 hours. She has had elevated blood pressure over the past couple of days.  Her blood pressure a couple of days ago was reportedly 200/100.  It was advised to switch from ASA to Plavix for secondary stroke prevention.  During her hospitalization in October 2014,  neurological exam revealed cogwheel rigidity and pill-rolling resting tremor in left hand.  She also endorsed memory problems and visual hallucinations.  She says she saw people, babies, and creatures.  She knows they aren't real but it is disturbing to her.  She also has paranoid behavior, such as thinking that people are talking about her or they are taking her candy.  She also reports that she sleeps often during the day and it is sometimes difficult for people to wake her.  She does report history of vivid dreams but denies anyone telling her she has symptoms consistent with REM sleep behavior disorder.  She denies memory problems.  She notes that her father had tremors.  She reports that most of her parkinsonism started after her stroke.    Hallucinations improved after discontinuation of some of her medications.  Sinemet and Exelon were discontinued as side effects appeared to outweigh benefits.  She has been wheelchair-bound for several years, she says due to diabetic neuropathy.  She fell multiple times.  She also has history of cervical stenosis as well as "lumbar problems".  She notes occasional tremor in her left hand and difficulty sometimes using the hand.  She notes feeling of lightheadedness and spinning if she sits up from bed.  She denies difficulty swallowing.  She has right arm pain stemming from her shoulder.  No numbness in the right arm.   PAST MEDICAL HISTORY: Past Medical History  Diagnosis Date  . Depression   . Diabetes mellitus   . Hypertension   . Peripheral neuropathy ([place])   . Diverticulosis   .  Urinary incontinence   . Herpes   . Ovarian cyst   . Fibrous breast lumps   . Wears glasses   . Fibromyalgia   . GERD (gastroesophageal reflux disease)   . COPD (chronic obstructive pulmonary disease) ([place])   . Asthma     bronchial  . Fatty liver     hx: of  . Anxiety   . Left carotid stenosis     Hx: of  . Breast cancer ([place])     "both sides" (10/29/2012)  .  Osteoarthritis     Archie Endo 09/12/2007  (10/29/2012)  . H/O hiatal hernia     Archie Endo 09/12/2007  (10/29/2012)  . Migraines     Archie Endo 09/01/2001 (10/29/2012)    MEDICATIONS: Current Outpatient Prescriptions on File Prior to Visit  Medication Sig Dispense Refill  . acetaminophen (TYLENOL) 325 MG tablet Take 650 mg by mouth every 4 (four) hours as needed for moderate pain.    Marland Kitchen ALPRAZolam (XANAX) 0.5 MG tablet Take 0.5 mg by mouth at bedtime as needed for anxiety.    Marland Kitchen alum & mag hydroxide-simeth (MAALOX/MYLANTA) 200-200-20 MG/5ML suspension Take 30 mLs by mouth every 4 (four) hours as needed for indigestion.    Marland Kitchen anastrozole (ARIMIDEX) 1 MG tablet Take 1 mg by mouth daily.    . ARIPiprazole (ABILIFY) 2 MG tablet Take 2 mg by mouth daily. *takes with 5mg  tablet for 7mg  dose    . ARIPiprazole (ABILIFY) 5 MG tablet Take 5 mg by mouth at bedtime. *takes with 2mg  tablet for 7mg  dose    . bisacodyl (DULCOLAX) 10 MG suppository Place 1 suppository (10 mg total) rectally daily as needed for moderate constipation. 12 suppository 0  . cefUROXime (CEFTIN) 500 MG tablet Take 1 tablet (500 mg total) by mouth 2 (two) times daily with a meal. 10 tablet 0  . cetirizine (ZYRTEC) 5 MG tablet Take 5 mg by mouth daily as needed for allergies.     . Cholecalciferol (VITAMIN D3) 1000 UNITS CAPS Take 2,000 Units by mouth daily.    . diclofenac sodium (VOLTAREN) 1 % GEL Apply 4 g topically 3 (three) times daily as needed (joint pain).    Marland Kitchen docusate sodium 100 MG CAPS Take 100 mg by mouth 2 (two) times daily. 10 capsule 0  . DULoxetine (CYMBALTA) 60 MG capsule Take 60 mg by mouth 2 (two) times daily.    . fluticasone (FLONASE) 50 MCG/ACT nasal spray Place 2 sprays into both nostrils daily as needed for rhinitis or allergies.  2  . gabapentin (NEURONTIN) 600 MG tablet Take 600 mg by mouth 3 (three) times daily.      Marland Kitchen HYDROcodone-acetaminophen (NORCO/VICODIN) 5-325 MG per tablet Take 2 tablets by mouth every 6 (six) hours  as needed. 10 tablet 0  . hydrOXYzine (ATARAX/VISTARIL) 10 MG tablet Take 1 tablet (10 mg total) by mouth every 8 (eight) hours as needed for itching. 30 tablet 0  . insulin glargine (LANTUS) 100 UNIT/ML injection Inject 100 Units into the skin 2 (two) times daily.    . insulin lispro (HUMALOG) 100 UNIT/ML injection Inject 100 Units into the skin 3 (three) times daily before meals.    Marland Kitchen ipratropium-albuterol (DUONEB) 0.5-2.5 (3) MG/3ML SOLN Take 3 mLs by nebulization every 6 (six) hours as needed (shortness of breath). 360 mL 2  . lansoprazole (PREVACID) 30 MG capsule Take 30 mg by mouth daily.    Marland Kitchen lisinopril (PRINIVIL,ZESTRIL) 2.5 MG tablet Take 2.5 mg by mouth daily.     Marland Kitchen  loratadine (CLARITIN) 10 MG tablet Take 10 mg by mouth daily.    . Melatonin 3 MG TABS Take 3 mg by mouth at bedtime.    . methylcellulose (ARTIFICIAL TEARS) 1 % ophthalmic solution Place 2 drops into both eyes 2 (two) times daily as needed (dry eyes).    . metoprolol succinate (TOPROL-XL) 25 MG 24 hr tablet Take 25 mg by mouth daily.      . metroNIDAZOLE (FLAGYL) 500 MG tablet Take 1 tablet (500 mg total) by mouth 3 (three) times daily. 15 tablet 0  . ondansetron (ZOFRAN) 4 MG tablet Take 4 mg by mouth every 8 (eight) hours as needed for nausea or vomiting.    . pravastatin (PRAVACHOL) 40 MG tablet Take 40 mg by mouth daily.    . Probiotic Product (PROBIOTIC MATURE ADULT PO) Take by mouth.    Orlie Dakin Sodium (SENNA S PO) Take 1 tablet by mouth 2 (two) times daily.     . temazepam (RESTORIL) 15 MG capsule Take 1 capsule (15 mg total) by mouth at bedtime as needed (for insomnia). 30 capsule 0  . [DISCONTINUED] ClonazePAM (KLONOPIN PO) Take 0.2 mg by mouth 2 (two) times daily.      . [DISCONTINUED] escitalopram (LEXAPRO) 20 MG tablet Take 10 mg by mouth. Take 3 tablets in the AM    . [DISCONTINUED] esomeprazole (NEXIUM) 40 MG capsule Take 40 mg by mouth daily before breakfast.      . [DISCONTINUED] Insulin Regular  Human (HUMULIN R U-500, CONCENTRATED, [place]) Inject into the skin 3 (three) times daily. Take three times / day prn, per SS Insulin coverage     No current facility-administered medications on file prior to visit.    ALLERGIES: Allergies  Allergen Reactions  . Sertraline Hcl Hives  . Codeine Hives and Swelling  . Cyclobenzaprine Hcl Hives and Swelling  . Fentanyl     REACTION: PANIC ATTACKS  . Furosemide     REACTION: face was swollen and hand  . Gadolinium Derivatives Swelling  . Hydrocodone-Acetaminophen   . Iohexol      Code: SOB, Desc: xray dye, Onset Date: [id number]   . Levofloxacin     REACTION: PANIC ATTACK,DIARRHEA,EYES DISCOLORD  . Lorazepam Nausea And Vomiting  . Lubiprostone     REACTION: REALLY BAD NAUSEA  . Metformin Hives and Swelling  . Metoclopramide Nausea And Vomiting and Swelling  . Morphine     REACTION: itching, panic attacks  . Oxycodone Hives and Swelling  . Promethazine Hcl     REACTION: TRAMA ANDJITTERS IN MY BODY  . Rosiglitazone Hives and Swelling  . Sertraline Hcl Hives and Swelling  . Verapamil Hives and Nausea And Vomiting    FAMILY HISTORY: Family History  Problem Relation Age of Onset  . Heart disease Mother   . Heart disease Brother   . Diabetes Brother   . Arthritis Sister   . Stroke Sister   . Cirrhosis Brother   . Cancer Brother     Bladder cancer    SOCIAL HISTORY: Social History   Social History  . Marital Status: Divorced    Spouse Name: N/A  . Number of Children: N/A  . Years of Education: N/A   Occupational History  . Not on file.   Social History Main Topics  . Smoking status: Former Smoker -- 20 years    Types: Cigarettes    Quit date: 08/15/1996  . Smokeless tobacco: Never Used     Comment: smoked 2-3 cigarettes/day  .  Alcohol Use: No  . Drug Use: No  . Sexual Activity: No   Other Topics Concern  . Not on file   Social History Narrative    REVIEW OF SYSTEMS: Constitutional: No fevers, chills, or  sweats, no generalized fatigue, change in appetite Eyes: No visual changes, double vision, eye pain Ear, nose and throat: No hearing loss, ear pain, nasal congestion, sore throat Cardiovascular: No chest pain, palpitations Respiratory:  No shortness of breath at rest or with exertion, wheezes GastrointestinaI: No nausea, vomiting, diarrhea, abdominal pain, fecal incontinence Genitourinary:  No dysuria, urinary retention or frequency Musculoskeletal:  No neck pain, back pain Integumentary: No rash, pruritus, skin lesions Neurological: as above Psychiatric: No depression, insomnia, anxiety Endocrine: No palpitations, fatigue, diaphoresis, mood swings, change in appetite, change in weight, increased thirst Hematologic/Lymphatic:  No anemia, purpura, petechiae. Allergic/Immunologic: no itchy/runny eyes, nasal congestion, recent allergic reactions, rashes  PHYSICAL EXAM: Filed Vitals:   11/24/14 0754  BP: 120/78  Pulse: 56  Resp: 12   General: No acute distress.   Head:  Normocephalic/atraumatic Eyes:  Fundoscopic exam unremarkable without vessel changes, exudates, hemorrhages or papilledema. Neck: supple, no paraspinal tenderness, full range of motion Heart:  Regular rate and rhythm Lungs:  Clear to auscultation bilaterally Back: No paraspinal tenderness Neurological Exam: Flat affect. Alert and oriented to person, place, but not time. Speech slowed but fluent and not dysarthric, language intact. Spelled WORLD backwards as DLORW.  Hypomimia. Attention, concentration and fund of knowledge intact.  Remote and recent memory intact.  CN II-XII intact. Bradykinetic, resting tremor in right hand, muscle strength Left triceps 4/5, spastic hands bilaterally, with flexed wrists and almost no finger movement,. 2/5 in LEs with left foot drop. Deep tendon reflexes absent throughout. Finger to nose without tremor with minimal reach. Wheelchair-bound.  IMPRESSION: Lewy Body Dementia.  Upon reviewing  history again, it appears this is again the most probable diagnosis CVA Spasticity Diabetic neuropathy  PLAN: 1.  We will restart Sinemet titration to goal of 1 tablet three times daily 2.  In one month, husband will call with update and we can initiate Aricept or possibly restart Exelon. 3.  I would like to refer patient to PM&R to be evaluated for possible Botox 4.  Once she is stable on these medications, we can then address neuropathic pain.  I don't want to start too many medications at once, especially since she has been affected by polypharmacy in the past. 5,  Continue secondary stroke prevention  26 minutes spent face to face with patient, over 50% spent discussing management and diagnosis.  Metta Clines, DO  CC:  Wenda Low, MD

## 2014-11-24 NOTE — Progress Notes (Signed)
Chart forwarded.  

## 2014-12-10 ENCOUNTER — Telehealth: Payer: Self-pay | Admitting: Neurology

## 2014-12-10 DIAGNOSIS — G3183 Dementia with Lewy bodies: Principal | ICD-10-CM

## 2014-12-10 DIAGNOSIS — F0281 Dementia in other diseases classified elsewhere with behavioral disturbance: Secondary | ICD-10-CM

## 2014-12-10 MED ORDER — QUETIAPINE FUMARATE 25 MG PO TABS: 25.0000 mg | ORAL_TABLET | Freq: Every day | ORAL | Status: DC

## 2014-12-10 NOTE — Telephone Encounter (Signed)
[name]/ from [facility]/ [phone]//called to inform that pt is having hallucinations

## 2014-12-10 NOTE — Telephone Encounter (Signed)
Seroquel 25mg  at bedtime

## 2014-12-10 NOTE — Telephone Encounter (Signed)
Spoke to [place], Pt has been having hallucinations/vivid dreams for 1 week off and on. Please advise.

## 2014-12-10 NOTE — Telephone Encounter (Signed)
[name] aware.

## 2014-12-28 ENCOUNTER — Telehealth: Payer: Self-pay | Admitting: Neurology

## 2014-12-28 MED ORDER — RIVASTIGMINE 4.6 MG/24HR TD PT24: 4.6000 mg | MEDICATED_PATCH | Freq: Every day | TRANSDERMAL | Status: DC

## 2014-12-28 NOTE — Telephone Encounter (Signed)
Katherine Carr needs to talk to someone about [name] medication please call [phone]

## 2014-12-28 NOTE — Telephone Encounter (Signed)
We will restart Exelon patch 4.6mg  daily. Instructed to call in 4 weeks and we can prescribe the 9.5mg  patch daily.

## 2014-12-28 NOTE — Telephone Encounter (Signed)
Mrs. Katherine Carr, called to get the new medication started. Per last A&P, husband to call in one month with update and to start Aricept or restart Exelon. No update. Called nursing home for update. [name], nurse, states that hallucinations have subsided, but other than that no real change. Please advise.   [facility]/ [phone]

## 2014-12-28 NOTE — Telephone Encounter (Signed)
Faxed orders to the nursing home.

## 2015-01-06 ENCOUNTER — Telehealth: Payer: Self-pay

## 2015-01-06 MED ORDER — RIVASTIGMINE TARTRATE 1.5 MG PO CAPS: ORAL_CAPSULE | ORAL | Status: DC

## 2015-01-06 NOTE — Addendum Note (Signed)
Addended by: Gerda Diss A on: 01/06/2015 03:52 PM   Modules accepted: Orders

## 2015-01-06 NOTE — Telephone Encounter (Signed)
Received fax from [employer]. Patient was affected by the annual formulary change. Medicare Part D will stop supplying pt with the Rivastigmine 4.6mg /24hr patch in 98 days (do her her being in a long term nursing facility). They would like to switch medication to Rivastigmine capsules. Capsules vs Patch okay? I can initiate an exception through insurance if medically necessary. Please advise.     Phone: [phone]

## 2015-01-06 NOTE — Telephone Encounter (Signed)
Instead, start oral rivastigmine 1.5mg  twice daily (taken with food).  If tolerating in 4 weeks, then can increase to 3mg  twice daily.

## 2015-01-06 NOTE — Telephone Encounter (Signed)
Please see below.

## 2015-01-07 NOTE — Telephone Encounter (Signed)
[name] at nursing home aware. Rx faxed to Nursing home and pharmacy.

## 2015-02-08 NOTE — Telephone Encounter (Signed)
Then we can increase rivastigmine to 3mg  twice daily.   Unless she has a UTI, confusion is likely related to progression of dementia

## 2015-02-08 NOTE — Telephone Encounter (Signed)
[name] called back about increasing the rivastigmine. States pt is tolerating fairly well. Is having uncontrollable cry attacks, however, unsure if that is a related symptom to medication or not. Also states that she believes pt's is now more confused. Please advise.   Telephone: [phone] Fax: [phone]

## 2015-02-09 MED ORDER — RIVASTIGMINE TARTRATE 3 MG PO CAPS: ORAL_CAPSULE | ORAL | Status: DC

## 2015-02-09 NOTE — Addendum Note (Signed)
Addended by: Gerda Diss A on: 02/09/2015 07:57 AM   Modules accepted: Orders

## 2015-02-09 NOTE — Telephone Encounter (Signed)
New order's faxed to [facility]. Will fax rx to pharmacy after physician signes.

## 2015-02-18 ENCOUNTER — Telehealth: Payer: Self-pay | Admitting: *Deleted

## 2015-02-18 NOTE — Telephone Encounter (Signed)
[name] given instructions and note faxed to [phone].

## 2015-02-18 NOTE — Telephone Encounter (Signed)
Reduce back to 1.5mg  BID. Thanks

## 2015-02-18 NOTE — Telephone Encounter (Signed)
Katherine Carr from [facility] called to let us know that patient is having dizziness since increasing the Exelon to 3 mg bid.  Please advise.

## 2015-05-24 ENCOUNTER — Ambulatory Visit: Payer: Medicare Other | Admitting: Neurology

## 2015-05-25 ENCOUNTER — Ambulatory Visit: Payer: Medicare Other | Admitting: Nurse Practitioner

## 2015-05-27 ENCOUNTER — Ambulatory Visit: Payer: Medicare Other | Admitting: Nurse Practitioner

## 2015-08-18 ENCOUNTER — Ambulatory Visit: Payer: Medicare Other | Admitting: Neurology

## 2015-10-14 ENCOUNTER — Ambulatory Visit: Payer: Medicare Other | Admitting: Neurology

## 2015-11-07 ENCOUNTER — Other Ambulatory Visit: Payer: Self-pay | Admitting: Internal Medicine

## 2015-11-07 DIAGNOSIS — W19XXXA Unspecified fall, initial encounter: Secondary | ICD-10-CM

## 2015-11-08 ENCOUNTER — Other Ambulatory Visit: Payer: Self-pay | Admitting: Internal Medicine

## 2015-11-08 DIAGNOSIS — M542 Cervicalgia: Secondary | ICD-10-CM

## 2015-11-08 DIAGNOSIS — W19XXXA Unspecified fall, initial encounter: Secondary | ICD-10-CM

## 2015-11-11 ENCOUNTER — Ambulatory Visit
Admission: RE | Admit: 2015-11-11 | Discharge: 2015-11-11 | Disposition: A | Discharge status: Home / Self Care | Payer: Medicare Other | Source: Ambulatory Visit | Attending: Internal Medicine | Admitting: Internal Medicine

## 2015-11-11 DIAGNOSIS — W19XXXA Unspecified fall, initial encounter: Secondary | ICD-10-CM

## 2015-11-11 DIAGNOSIS — M542 Cervicalgia: Secondary | ICD-10-CM

## 2016-01-21 ENCOUNTER — Emergency Department (HOSPITAL_COMMUNITY): Payer: Medicare Other

## 2016-01-21 ENCOUNTER — Encounter (HOSPITAL_COMMUNITY): Payer: Self-pay | Admitting: Emergency Medicine

## 2016-01-21 ENCOUNTER — Inpatient Hospital Stay (HOSPITAL_COMMUNITY)
Admission: EM | Admit: 2016-01-21 | Discharge: 2016-01-24 | DRG: 381 | Disposition: A | Discharge status: Skilled Nursing Facility | Payer: Medicare Other | Attending: Internal Medicine | Admitting: Internal Medicine

## 2016-01-21 DIAGNOSIS — F329 Major depressive disorder, single episode, unspecified: Secondary | ICD-10-CM | POA: Diagnosis present

## 2016-01-21 DIAGNOSIS — K920 Hematemesis: Secondary | ICD-10-CM | POA: Diagnosis present

## 2016-01-21 DIAGNOSIS — K449 Diaphragmatic hernia without obstruction or gangrene: Secondary | ICD-10-CM | POA: Diagnosis not present

## 2016-01-21 DIAGNOSIS — K2211 Ulcer of esophagus with bleeding: Secondary | ICD-10-CM | POA: Diagnosis not present

## 2016-01-21 DIAGNOSIS — K317 Polyp of stomach and duodenum: Secondary | ICD-10-CM | POA: Diagnosis not present

## 2016-01-21 DIAGNOSIS — F419 Anxiety disorder, unspecified: Secondary | ICD-10-CM | POA: Diagnosis present

## 2016-01-21 DIAGNOSIS — Z833 Family history of diabetes mellitus: Secondary | ICD-10-CM

## 2016-01-21 DIAGNOSIS — Z7902 Long term (current) use of antithrombotics/antiplatelets: Secondary | ICD-10-CM

## 2016-01-21 DIAGNOSIS — H547 Unspecified visual loss: Secondary | ICD-10-CM | POA: Diagnosis present

## 2016-01-21 DIAGNOSIS — E1142 Type 2 diabetes mellitus with diabetic polyneuropathy: Secondary | ICD-10-CM | POA: Diagnosis present

## 2016-01-21 DIAGNOSIS — R000 Tachycardia, unspecified: Secondary | ICD-10-CM

## 2016-01-21 DIAGNOSIS — Z7401 Bed confinement status: Secondary | ICD-10-CM

## 2016-01-21 DIAGNOSIS — Z79899 Other long term (current) drug therapy: Secondary | ICD-10-CM

## 2016-01-21 DIAGNOSIS — E785 Hyperlipidemia, unspecified: Secondary | ICD-10-CM | POA: Diagnosis present

## 2016-01-21 DIAGNOSIS — N390 Urinary tract infection, site not specified: Secondary | ICD-10-CM

## 2016-01-21 DIAGNOSIS — K210 Gastro-esophageal reflux disease with esophagitis: Secondary | ICD-10-CM | POA: Diagnosis present

## 2016-01-21 DIAGNOSIS — Z981 Arthrodesis status: Secondary | ICD-10-CM

## 2016-01-21 DIAGNOSIS — G20 Parkinson's disease: Secondary | ICD-10-CM | POA: Diagnosis not present

## 2016-01-21 DIAGNOSIS — Z881 Allergy status to other antibiotic agents status: Secondary | ICD-10-CM

## 2016-01-21 DIAGNOSIS — J449 Chronic obstructive pulmonary disease, unspecified: Secondary | ICD-10-CM | POA: Diagnosis not present

## 2016-01-21 DIAGNOSIS — Z66 Do not resuscitate: Secondary | ICD-10-CM | POA: Diagnosis not present

## 2016-01-21 DIAGNOSIS — R319 Hematuria, unspecified: Secondary | ICD-10-CM

## 2016-01-21 DIAGNOSIS — K2980 Duodenitis without bleeding: Secondary | ICD-10-CM | POA: Diagnosis not present

## 2016-01-21 DIAGNOSIS — Z90710 Acquired absence of both cervix and uterus: Secondary | ICD-10-CM

## 2016-01-21 DIAGNOSIS — I10 Essential (primary) hypertension: Secondary | ICD-10-CM | POA: Diagnosis not present

## 2016-01-21 DIAGNOSIS — Z87891 Personal history of nicotine dependence: Secondary | ICD-10-CM

## 2016-01-21 DIAGNOSIS — R509 Fever, unspecified: Secondary | ICD-10-CM

## 2016-01-21 DIAGNOSIS — I69354 Hemiplegia and hemiparesis following cerebral infarction affecting left non-dominant side: Secondary | ICD-10-CM | POA: Diagnosis not present

## 2016-01-21 DIAGNOSIS — N3001 Acute cystitis with hematuria: Secondary | ICD-10-CM

## 2016-01-21 DIAGNOSIS — Z853 Personal history of malignant neoplasm of breast: Secondary | ICD-10-CM

## 2016-01-21 DIAGNOSIS — Z8249 Family history of ischemic heart disease and other diseases of the circulatory system: Secondary | ICD-10-CM

## 2016-01-21 DIAGNOSIS — T45525A Adverse effect of antithrombotic drugs, initial encounter: Secondary | ICD-10-CM | POA: Diagnosis present

## 2016-01-21 DIAGNOSIS — E1129 Type 2 diabetes mellitus with other diabetic kidney complication: Secondary | ICD-10-CM

## 2016-01-21 DIAGNOSIS — M797 Fibromyalgia: Secondary | ICD-10-CM | POA: Diagnosis not present

## 2016-01-21 DIAGNOSIS — K922 Gastrointestinal hemorrhage, unspecified: Secondary | ICD-10-CM | POA: Diagnosis present

## 2016-01-21 DIAGNOSIS — R809 Proteinuria, unspecified: Secondary | ICD-10-CM

## 2016-01-21 DIAGNOSIS — D649 Anemia, unspecified: Secondary | ICD-10-CM | POA: Diagnosis present

## 2016-01-21 DIAGNOSIS — Z885 Allergy status to narcotic agent status: Secondary | ICD-10-CM

## 2016-01-21 DIAGNOSIS — Z888 Allergy status to other drugs, medicaments and biological substances status: Secondary | ICD-10-CM

## 2016-01-21 DIAGNOSIS — Z794 Long term (current) use of insulin: Secondary | ICD-10-CM

## 2016-01-21 LAB — URINALYSIS, ROUTINE W REFLEX MICROSCOPIC
Bilirubin Urine: NEGATIVE
Glucose, UA: 150 mg/dL — AB
KETONES UR: NEGATIVE mg/dL
Nitrite: POSITIVE — AB
PH: 5 (ref 5.0–8.0)
Protein, ur: >=300 mg/dL — AB
Specific Gravity, Urine: 1.019 (ref 1.005–1.030)
Squamous Epithelial / LPF: NONE SEEN

## 2016-01-21 LAB — CBC
HCT: 38.2 % (ref 36.0–46.0)
Hemoglobin: 12.9 g/dL (ref 12.0–15.0)
MCH: 31.3 pg (ref 26.0–34.0)
MCHC: 33.8 g/dL (ref 30.0–36.0)
MCV: 92.7 fL (ref 78.0–100.0)
PLATELETS: 162 10*3/uL (ref 150–400)
RBC: 4.12 MIL/uL (ref 3.87–5.11)
RDW: 14.1 % (ref 11.5–15.5)
WBC: 9.3 10*3/uL (ref 4.0–10.5)

## 2016-01-21 LAB — MRSA PCR SCREENING: MRSA by PCR: NEGATIVE

## 2016-01-21 LAB — GLUCOSE, CAPILLARY
GLUCOSE-CAPILLARY: 278 mg/dL — AB (ref 65–99)
Glucose-Capillary: 162 mg/dL — ABNORMAL HIGH (ref 65–99)

## 2016-01-21 LAB — COMPREHENSIVE METABOLIC PANEL
ALK PHOS: 109 U/L (ref 38–126)
ALT: 16 U/L (ref 14–54)
AST: 18 U/L (ref 15–41)
Albumin: 3.6 g/dL (ref 3.5–5.0)
Anion gap: 9 (ref 5–15)
BUN: 43 mg/dL — ABNORMAL HIGH (ref 6–20)
CALCIUM: 8.9 mg/dL (ref 8.9–10.3)
CO2: 30 mmol/L (ref 22–32)
CREATININE: 1.36 mg/dL — AB (ref 0.44–1.00)
Chloride: 102 mmol/L (ref 101–111)
GFR calc non Af Amer: 37 mL/min — ABNORMAL LOW (ref >60)
GFR, EST AFRICAN AMERICAN: 43 mL/min — AB (ref >60)
Glucose, Bld: 281 mg/dL — ABNORMAL HIGH (ref 65–99)
Potassium: 4.4 mmol/L (ref 3.5–5.1)
SODIUM: 141 mmol/L (ref 135–145)
Total Bilirubin: 0.8 mg/dL (ref 0.3–1.2)
Total Protein: 6.7 g/dL (ref 6.5–8.1)

## 2016-01-21 LAB — CBG MONITORING, ED: Glucose-Capillary: 204 mg/dL — ABNORMAL HIGH (ref 65–99)

## 2016-01-21 LAB — HEMOGLOBIN AND HEMATOCRIT, BLOOD
HCT: 34.7 % — ABNORMAL LOW (ref 36.0–46.0)
HEMATOCRIT: 29.3 % — AB (ref 36.0–46.0)
HEMOGLOBIN: 11.6 g/dL — AB (ref 12.0–15.0)
HEMOGLOBIN: 10 g/dL — AB (ref 12.0–15.0)

## 2016-01-21 LAB — TYPE AND SCREEN
ABO/RH(D): B POS
ANTIBODY SCREEN: NEGATIVE

## 2016-01-21 LAB — I-STAT CG4 LACTIC ACID, ED
LACTIC ACID, VENOUS: 1.58 mmol/L (ref 0.5–1.9)
Lactic Acid, Venous: 1.59 mmol/L (ref 0.5–1.9)

## 2016-01-21 LAB — ABO/RH: ABO/RH(D): B POS

## 2016-01-21 LAB — LIPASE, BLOOD: Lipase: 46 U/L (ref 11–51)

## 2016-01-21 LAB — POC OCCULT BLOOD, ED: Fecal Occult Bld: NEGATIVE

## 2016-01-21 MED ORDER — DULOXETINE HCL 60 MG PO CPEP
60.0000 mg | ORAL_CAPSULE | Freq: Two times a day (BID) | ORAL | Status: DC
  Administered 2016-01-21 – 2016-01-24 (×6): 60 mg via ORAL
  Filled 2016-01-21 (×6): qty 1

## 2016-01-21 MED ORDER — ACETAMINOPHEN 325 MG PO TABS
650.0000 mg | ORAL_TABLET | Freq: Four times a day (QID) | ORAL | Status: DC | PRN
  Administered 2016-01-21: 650 mg via ORAL
  Filled 2016-01-21: qty 2

## 2016-01-21 MED ORDER — SODIUM CHLORIDE 0.9 % IV BOLUS (SEPSIS)
500.0000 mL | Freq: Once | INTRAVENOUS | Status: AC
  Administered 2016-01-21: 500 mL via INTRAVENOUS

## 2016-01-21 MED ORDER — CLONIDINE HCL 0.1 MG PO TABS
0.1000 mg | ORAL_TABLET | Freq: Once | ORAL | Status: AC
  Administered 2016-01-21: 0.1 mg via ORAL
  Filled 2016-01-21: qty 1

## 2016-01-21 MED ORDER — PREDNISOLONE ACETATE 1 % OP SUSP
1.0000 [drp] | Freq: Every day | OPHTHALMIC | Status: DC
  Administered 2016-01-21 – 2016-01-24 (×4): 1 [drp] via OPHTHALMIC
  Filled 2016-01-21: qty 1

## 2016-01-21 MED ORDER — LORATADINE 10 MG PO TABS
10.0000 mg | ORAL_TABLET | Freq: Every day | ORAL | Status: DC
  Administered 2016-01-21 – 2016-01-24 (×4): 10 mg via ORAL
  Filled 2016-01-21 (×4): qty 1

## 2016-01-21 MED ORDER — SODIUM CHLORIDE 0.9% FLUSH
3.0000 mL | Freq: Two times a day (BID) | INTRAVENOUS | Status: DC
  Administered 2016-01-21 – 2016-01-24 (×6): 3 mL via INTRAVENOUS

## 2016-01-21 MED ORDER — ANASTROZOLE 1 MG PO TABS
1.0000 mg | ORAL_TABLET | Freq: Every day | ORAL | Status: DC
  Administered 2016-01-21 – 2016-01-24 (×4): 1 mg via ORAL
  Filled 2016-01-21 (×3): qty 1

## 2016-01-21 MED ORDER — VITAMIN D 1000 UNITS PO TABS
2000.0000 [IU] | ORAL_TABLET | Freq: Every day | ORAL | Status: DC
  Administered 2016-01-21 – 2016-01-24 (×4): 2000 [IU] via ORAL
  Filled 2016-01-21 (×4): qty 2

## 2016-01-21 MED ORDER — ALPRAZOLAM 0.5 MG PO TABS
0.5000 mg | ORAL_TABLET | Freq: Every evening | ORAL | Status: DC | PRN
  Administered 2016-01-21 – 2016-01-23 (×3): 0.5 mg via ORAL
  Filled 2016-01-21 (×3): qty 1

## 2016-01-21 MED ORDER — ACETAMINOPHEN 325 MG PO TABS
650.0000 mg | ORAL_TABLET | Freq: Once | ORAL | Status: AC
  Administered 2016-01-21: 650 mg via ORAL
  Filled 2016-01-21: qty 2

## 2016-01-21 MED ORDER — LISINOPRIL 10 MG PO TABS
5.0000 mg | ORAL_TABLET | Freq: Two times a day (BID) | ORAL | Status: DC
  Administered 2016-01-21 – 2016-01-24 (×6): 5 mg via ORAL
  Filled 2016-01-21 (×6): qty 1

## 2016-01-21 MED ORDER — IPRATROPIUM-ALBUTEROL 0.5-2.5 (3) MG/3ML IN SOLN: 3.0000 mL | Freq: Four times a day (QID) | RESPIRATORY_TRACT | Status: DC | PRN

## 2016-01-21 MED ORDER — CALCIUM CARBONATE 1250 (500 CA) MG PO TABS
1.0000 | ORAL_TABLET | Freq: Three times a day (TID) | ORAL | Status: DC
  Administered 2016-01-21 – 2016-01-24 (×7): 500 mg via ORAL
  Filled 2016-01-21 (×7): qty 1

## 2016-01-21 MED ORDER — MELATONIN 3 MG PO TABS: 3.0000 mg | ORAL_TABLET | Freq: Every day | ORAL | Status: DC

## 2016-01-21 MED ORDER — INSULIN DETEMIR 100 UNIT/ML ~~LOC~~ SOLN
25.0000 [IU] | Freq: Every day | SUBCUTANEOUS | Status: DC
  Administered 2016-01-21 – 2016-01-23 (×3): 25 [IU] via SUBCUTANEOUS
  Filled 2016-01-21 (×4): qty 0.25

## 2016-01-21 MED ORDER — QUETIAPINE FUMARATE 25 MG PO TABS
50.0000 mg | ORAL_TABLET | Freq: Every day | ORAL | Status: DC
  Administered 2016-01-21 – 2016-01-23 (×3): 50 mg via ORAL
  Filled 2016-01-21 (×3): qty 2

## 2016-01-21 MED ORDER — ATROPINE SULFATE 1 % OP SOLN
1.0000 [drp] | Freq: Every day | OPHTHALMIC | Status: DC
  Administered 2016-01-21 – 2016-01-24 (×4): 1 [drp] via OPHTHALMIC
  Filled 2016-01-21: qty 2

## 2016-01-21 MED ORDER — POLYETHYLENE GLYCOL 3350 17 GM/SCOOP PO POWD
17.0000 g | Freq: Every day | ORAL | Status: DC
  Filled 2016-01-21: qty 255

## 2016-01-21 MED ORDER — ONDANSETRON HCL 4 MG PO TABS: 4.0000 mg | ORAL_TABLET | Freq: Four times a day (QID) | ORAL | Status: DC | PRN

## 2016-01-21 MED ORDER — SODIUM CHLORIDE 0.9 % IV SOLN: 250.0000 mL | INTRAVENOUS | Status: DC | PRN

## 2016-01-21 MED ORDER — DEXTROSE 5 % IV SOLN
1.0000 g | INTRAVENOUS | Status: AC
  Administered 2016-01-22 – 2016-01-24 (×3): 1 g via INTRAVENOUS
  Filled 2016-01-21 (×3): qty 10

## 2016-01-21 MED ORDER — SENNOSIDES-DOCUSATE SODIUM 8.6-50 MG PO TABS
1.0000 | ORAL_TABLET | Freq: Two times a day (BID) | ORAL | Status: DC
  Administered 2016-01-21 – 2016-01-24 (×6): 1 via ORAL
  Filled 2016-01-21 (×6): qty 1

## 2016-01-21 MED ORDER — TIZANIDINE HCL 4 MG PO TABS
2.0000 mg | ORAL_TABLET | Freq: Three times a day (TID) | ORAL | Status: DC | PRN
  Filled 2016-01-21: qty 1

## 2016-01-21 MED ORDER — TRAMADOL HCL 50 MG PO TABS
25.0000 mg | ORAL_TABLET | Freq: Four times a day (QID) | ORAL | Status: DC | PRN
  Administered 2016-01-21 – 2016-01-24 (×5): 25 mg via ORAL
  Filled 2016-01-21 (×6): qty 1

## 2016-01-21 MED ORDER — METOPROLOL SUCCINATE ER 50 MG PO TB24
50.0000 mg | ORAL_TABLET | Freq: Every day | ORAL | Status: DC
  Administered 2016-01-21 – 2016-01-24 (×4): 50 mg via ORAL
  Filled 2016-01-21 (×4): qty 1

## 2016-01-21 MED ORDER — SODIUM CHLORIDE 0.9% FLUSH: 3.0000 mL | INTRAVENOUS | Status: DC | PRN

## 2016-01-21 MED ORDER — INSULIN ASPART 100 UNIT/ML ~~LOC~~ SOLN
0.0000 [IU] | Freq: Three times a day (TID) | SUBCUTANEOUS | Status: DC
  Administered 2016-01-21: 2 [IU] via SUBCUTANEOUS
  Administered 2016-01-22 – 2016-01-23 (×3): 1 [IU] via SUBCUTANEOUS
  Administered 2016-01-23: 3 [IU] via SUBCUTANEOUS
  Administered 2016-01-24: 2 [IU] via SUBCUTANEOUS

## 2016-01-21 MED ORDER — POLYETHYLENE GLYCOL 3350 17 G PO PACK
17.0000 g | PACK | Freq: Every day | ORAL | Status: DC
  Administered 2016-01-21 – 2016-01-24 (×4): 17 g via ORAL
  Filled 2016-01-21 (×4): qty 1

## 2016-01-21 MED ORDER — CEFTRIAXONE SODIUM 1 G IJ SOLR
1.0000 g | Freq: Once | INTRAMUSCULAR | Status: AC
  Administered 2016-01-21: 1 g via INTRAVENOUS
  Filled 2016-01-21: qty 10

## 2016-01-21 MED ORDER — CARBIDOPA-LEVODOPA 25-100 MG PO TABS
1.0000 | ORAL_TABLET | Freq: Three times a day (TID) | ORAL | Status: DC
  Administered 2016-01-21 – 2016-01-24 (×8): 1 via ORAL
  Filled 2016-01-21 (×8): qty 1

## 2016-01-21 MED ORDER — TIZANIDINE HCL 4 MG PO TABS
2.0000 mg | ORAL_TABLET | Freq: Two times a day (BID) | ORAL | Status: DC
  Administered 2016-01-21 – 2016-01-24 (×6): 2 mg via ORAL
  Filled 2016-01-21 (×6): qty 1

## 2016-01-21 MED ORDER — RIVASTIGMINE TARTRATE 1.5 MG PO CAPS
1.5000 mg | ORAL_CAPSULE | Freq: Two times a day (BID) | ORAL | Status: DC
  Administered 2016-01-21 – 2016-01-24 (×6): 1.5 mg via ORAL
  Filled 2016-01-21 (×6): qty 1

## 2016-01-21 MED ORDER — ONDANSETRON HCL 4 MG/2ML IJ SOLN: 4.0000 mg | Freq: Four times a day (QID) | INTRAMUSCULAR | Status: DC | PRN

## 2016-01-21 MED ORDER — GABAPENTIN 300 MG PO CAPS
600.0000 mg | ORAL_CAPSULE | Freq: Two times a day (BID) | ORAL | Status: DC
  Administered 2016-01-21 – 2016-01-24 (×6): 600 mg via ORAL
  Filled 2016-01-21 (×6): qty 2

## 2016-01-21 MED ORDER — SODIUM CHLORIDE 0.9 % IV BOLUS (SEPSIS)
1000.0000 mL | Freq: Once | INTRAVENOUS | Status: AC
  Administered 2016-01-21: 1000 mL via INTRAVENOUS

## 2016-01-21 MED ORDER — PANTOPRAZOLE SODIUM 40 MG IV SOLR
40.0000 mg | Freq: Once | INTRAVENOUS | Status: AC
  Administered 2016-01-21: 40 mg via INTRAVENOUS
  Filled 2016-01-21: qty 40

## 2016-01-21 MED ORDER — PANTOPRAZOLE SODIUM 40 MG IV SOLR
40.0000 mg | Freq: Two times a day (BID) | INTRAVENOUS | Status: DC
  Administered 2016-01-21 – 2016-01-23 (×5): 40 mg via INTRAVENOUS
  Filled 2016-01-21 (×5): qty 40

## 2016-01-21 MED ORDER — ACETAMINOPHEN 650 MG RE SUPP: 650.0000 mg | Freq: Four times a day (QID) | RECTAL | Status: DC | PRN

## 2016-01-21 MED ORDER — PRAVASTATIN SODIUM 20 MG PO TABS
20.0000 mg | ORAL_TABLET | Freq: Every day | ORAL | Status: DC
  Administered 2016-01-21 – 2016-01-23 (×3): 20 mg via ORAL
  Filled 2016-01-21 (×3): qty 1

## 2016-01-21 NOTE — Progress Notes (Signed)
Pharmacy: ceftriaxone  Patient's a 76 y.o. F presented to the ED from [facility] with c/o hematemesis and abdominal pain.  UA showed many bacteria and positive nitrite.  To start ceftriaxone for UTI.  Plan: - ceftriaxone 1 gm IV q24h - no renal adjustment is needed with this abx.  Pharmacy will sign off. - re-consult [place] if need further assistance.  Thank you for asking pharmacy to participate in this patient's care.  Dia Sitter, PharmD, BCPS 01/21/2016 5:27 PM

## 2016-01-21 NOTE — ED Notes (Signed)
ED Provider at bedside. 

## 2016-01-21 NOTE — ED Notes (Signed)
Bed: WA09 Expected date:  Expected time:  Means of arrival:  Comments: EMS 76 yo female from SNF-vomited coffee ground emesis/HTN

## 2016-01-21 NOTE — ED Provider Notes (Signed)
[facility] DEPT Provider Note   CSN: [id number] Arrival date & time: 01/21/16  0719     History   Chief Complaint Chief Complaint  Patient presents with  . Hematemesis    HPI MCKENDRA KOFOED is a 76 y.o. female with a past medical history significant for blindness, peripheral breast cancer, COPD, diabetes, asthma, hypertension, GERD, and peripheral neuropathy who presents with hematemesis and lightheadedness. Patient lives at [facility] facility and according to the patient and family, patient had 5 episodes of bloody vomit overnight. There was no reported it was black or bright blood. Patient denies any history of the same. She denies any history of esophageal varices. She denies any pain specifically, no chest pain, abdominal pain. She says she had a normal bowel movement several days ago but does struggle with some constipation. She denies changes in urination, productive cough, fevers, or chills. She denies any current nausea.   Patient's only complaint on arrival is lightheadedness and some fatigue. He denies any palpitations or dizziness.    HPI  Past Medical History:  Diagnosis Date  . Anxiety   . Asthma    bronchial  . Breast cancer ([place])    "both sides" (10/29/2012)  . COPD (chronic obstructive pulmonary disease) ([place])   . Depression   . Diabetes mellitus   . Diverticulosis   . Fatty liver    hx: of  . Fibromyalgia   . Fibrous breast lumps   . GERD (gastroesophageal reflux disease)   . H/O hiatal hernia    Archie Endo 09/12/2007  (10/29/2012)  . Herpes   . Hypertension   . Left carotid stenosis    Hx: of  . Migraines    Archie Endo 09/01/2001 (10/29/2012)  . Osteoarthritis    Archie Endo 09/12/2007  (10/29/2012)  . Ovarian cyst   . Peripheral neuropathy ([place])   . Urinary incontinence   . Wears glasses     Patient Active Problem List   Diagnosis Date Noted  . UTI (lower urinary tract infection) 02/05/2013  . Neuropathy ([place]) 02/05/2013  . HTN (hypertension),  malignant 02/05/2013  . Parkinson disease ([place]) 02/05/2013  . Febrile illness, acute 02/05/2013  . Acute encephalopathy 10/29/2012  . Elevated LFTs 10/29/2012  . Common bile duct dilatation 10/29/2012  . Primary cancer of upper inner quadrant of right female breast ([place]) 05/19/2012  . Carcinoma in situ of breast 04/11/2012  . Intraductal papilloma of breast 03/17/2012  . ANXIETY DEPRESSION 03/24/2008  . ABDOMINAL PAIN-MULTIPLE SITES 02/17/2008  . LIVER FUNCTION TESTS, ABNORMAL, HX OF 02/12/2008  . GASTROPARESIS 09/24/2007  . BACK PAIN 09/24/2007  . Chest pain, unspecified 09/24/2007  . IRRITABLE BOWEL SYNDROME 09/09/2007  . DYSPHAGIA 09/09/2007  . Abdominal pain, epigastric 09/09/2007  . ABDOMINAL PAIN-GENERALIZED 09/09/2007  . URI 07/08/2007  . CLAUDICATION, INTERMITTENT 04/21/2007  . FATIGUE 04/21/2007  . EDEMA 04/21/2007  . OBESITY 02/26/2007  . DEPRESSION 02/26/2007  . MIGRAINE HEADACHE 02/26/2007  . ALLERGIC RHINITIS 02/26/2007  . ASTHMA 02/26/2007  . GERD 02/26/2007  . DEGENERATIVE DISC DISEASE 02/26/2007  . FIBROMYALGIA 02/26/2007  . RHEUMATIC FEVER, HX OF 02/26/2007  . ACUTE BRONCHITIS 02/21/2007  . HYPERLIPIDEMIA 01/31/2007  . LIVER FUNCTION TESTS, ABNORMAL 01/10/2007  . IBD 09/17/2006  . DIABETES MELLITUS, TYPE II 09/04/2006  . HYPERLIPIDEMIA 09/04/2006  . COMMON MIGRAINE 09/04/2006  . HYPERTENSION 09/04/2006  . OSTEOARTHRITIS 09/04/2006  . SPONDYLOSIS, CERVICAL 09/04/2006  . CHEST PAIN, HX OF 09/04/2006  . ESOPHAGITIS 09/05/2004  . HIATAL HERNIA 09/05/2004  . HEMORRHOIDS, INTERNAL  02/18/2001  . GASTRITIS, CHRONIC 02/18/2001  . Diverticulosis of colon (without mention of hemorrhage) 02/18/2001    Past Surgical History:  Procedure Laterality Date  . ABDOMINAL HYSTERECTOMY    . ANTERIOR CERVICAL DECOMP/DISCECTOMY FUSION  09/2003   Archie Endo 09/25/2003 (10/29/2012)  . APPENDECTOMY    . BACK SURGERY     central decompression L5-S1/notes 10/23/2005  (10/29/2012)  . BREAST DUCTAL SYSTEM EXCISION Left    Archie Endo 04/02/2012  (10/29/2012)  . BREAST LUMPECTOMY Right 04/21/2012   Procedure: Right breast LUMPECTOMY with re-excision of margins;  Surgeon: Adin Hector, MD;  Location: [facility];  Service: General;  Laterality: Right;  . BREAST LUMPECTOMY WITH NEEDLE LOCALIZATION Right 04/02/2012   Procedure: Excision Ductal System Right Breast with Needle Localization;  Surgeon: Adin Hector, MD;  Location: [facility];  Service: General;  Laterality: Right;  needle localization at 7:30 at [facility]   . CARPAL TUNNEL RELEASE Bilateral   . CESAREAN SECTION    . MYOMECTOMY    . NEVUS EXCISION Left 04/02/2012   Procedure: Excision Nevus Left Abdominal Wall;  Surgeon: Adin Hector, MD;  Location: [facility];  Service: General;  Laterality: Left;  . TONSILLECTOMY      OB History    Gravida Para Term Preterm AB Living   1 1       1    SAB TAB Ectopic Multiple Live Births                   Home Medications    Prior to Admission medications   Medication Sig Start Date End Date Taking? Authorizing Provider  acetaminophen (TYLENOL) 325 MG tablet Take 650 mg by mouth every 4 (four) hours as needed for moderate pain.    Historical Provider, MD  ALPRAZolam Duanne Moron) 0.5 MG tablet Take 0.5 mg by mouth at bedtime as needed for anxiety.    Historical Provider, MD  alum & mag hydroxide-simeth (MAALOX/MYLANTA) 200-200-20 MG/5ML suspension Take 30 mLs by mouth every 4 (four) hours as needed for indigestion.    Historical Provider, MD  anastrozole (ARIMIDEX) 1 MG tablet Take 1 mg by mouth daily.    Historical Provider, MD  ARIPiprazole (ABILIFY) 2 MG tablet Take 2 mg by mouth daily. *takes with 5mg  tablet for 7mg  dose    Historical Provider, MD  ARIPiprazole (ABILIFY) 5 MG tablet Take 5 mg by mouth at bedtime. *takes with 2mg  tablet for 7mg  dose    Historical Provider, MD  bisacodyl (DULCOLAX) 10 MG suppository Place 1 suppository (10 mg  total) rectally daily as needed for moderate constipation. 02/06/13   Melton Alar, PA-C  carbidopa-levodopa (SINEMET) 25-100 MG tablet Take 1/2 tab twice daily x 3 days, then 1/2 tab three times daily x 4 days, then 1 tab three times daily 11/24/14   Pieter Partridge, DO  cefUROXime (CEFTIN) 500 MG tablet Take 1 tablet (500 mg total) by mouth 2 (two) times daily with a meal. 02/06/13   Melton Alar, PA-C  cetirizine (ZYRTEC) 5 MG tablet Take 5 mg by mouth daily as needed for allergies.     Historical Provider, MD  Cholecalciferol (VITAMIN D3) 1000 UNITS CAPS Take 2,000 Units by mouth daily.    Historical Provider, MD  diclofenac sodium (VOLTAREN) 1 % GEL Apply 4 g topically 3 (three) times daily as needed (joint pain).    Historical Provider, MD  docusate sodium 100 MG CAPS Take 100 mg by mouth 2 (two) times  daily. 02/06/13   Melton Alar, PA-C  DULoxetine (CYMBALTA) 60 MG capsule Take 60 mg by mouth 2 (two) times daily.    Historical Provider, MD  fluticasone (FLONASE) 50 MCG/ACT nasal spray Place 2 sprays into both nostrils daily as needed for rhinitis or allergies. 02/06/13   Bobby Rumpf York, PA-C  gabapentin (NEURONTIN) 600 MG tablet Take 600 mg by mouth 3 (three) times daily.      Historical Provider, MD  HYDROcodone-acetaminophen (NORCO/VICODIN) 5-325 MG per tablet Take 2 tablets by mouth every 6 (six) hours as needed. 02/06/13   Melton Alar, PA-C  hydrOXYzine (ATARAX/VISTARIL) 10 MG tablet Take 1 tablet (10 mg total) by mouth every 8 (eight) hours as needed for itching. 02/06/13   Bobby Rumpf York, PA-C  insulin glargine (LANTUS) 100 UNIT/ML injection Inject 100 Units into the skin 2 (two) times daily.    Historical Provider, MD  insulin lispro (HUMALOG) 100 UNIT/ML injection Inject 100 Units into the skin 3 (three) times daily before meals.    Historical Provider, MD  ipratropium-albuterol (DUONEB) 0.5-2.5 (3) MG/3ML SOLN Take 3 mLs by nebulization every 6 (six) hours as needed (shortness of  breath). 02/06/13   Melton Alar, PA-C  lansoprazole (PREVACID) 30 MG capsule Take 30 mg by mouth daily.    Historical Provider, MD  lisinopril (PRINIVIL,ZESTRIL) 2.5 MG tablet Take 2.5 mg by mouth daily.  05/18/11   Historical Provider, MD  loratadine (CLARITIN) 10 MG tablet Take 10 mg by mouth daily.    Historical Provider, MD  Melatonin 3 MG TABS Take 3 mg by mouth at bedtime.    Historical Provider, MD  methylcellulose (ARTIFICIAL TEARS) 1 % ophthalmic solution Place 2 drops into both eyes 2 (two) times daily as needed (dry eyes).    Historical Provider, MD  metoprolol succinate (TOPROL-XL) 25 MG 24 hr tablet Take 25 mg by mouth daily.      Historical Provider, MD  metroNIDAZOLE (FLAGYL) 500 MG tablet Take 1 tablet (500 mg total) by mouth 3 (three) times daily. 02/06/13   Bobby Rumpf York, PA-C  ondansetron (ZOFRAN) 4 MG tablet Take 4 mg by mouth every 8 (eight) hours as needed for nausea or vomiting.    Historical Provider, MD  pravastatin (PRAVACHOL) 40 MG tablet Take 40 mg by mouth daily.    Historical Provider, MD  Probiotic Product (PROBIOTIC MATURE ADULT PO) Take by mouth.    Historical Provider, MD  QUEtiapine (SEROQUEL) 25 MG tablet Take 1 tablet (25 mg total) by mouth at bedtime. 12/10/14   Pieter Partridge, DO  rivastigmine (EXELON) 3 MG capsule Take 1 tablet twice daily with food. 02/09/15   Pieter Partridge, DO  rivastigmine (EXELON) 4.6 mg/24hr Place 1 patch (4.6 mg total) onto the skin daily. 12/28/14   Pieter Partridge, DO  Sennosides-Docusate Sodium (SENNA S PO) Take 1 tablet by mouth 2 (two) times daily.     Historical Provider, MD  temazepam (RESTORIL) 15 MG capsule Take 1 capsule (15 mg total) by mouth at bedtime as needed (for insomnia). 02/06/13   Melton Alar, PA-C    Family History Family History  Problem Relation Age of Onset  . Heart disease Mother   . Heart disease Brother   . Diabetes Brother   . Arthritis Sister   . Stroke Sister   . Cirrhosis Brother   . Cancer Brother      Bladder cancer    Social History Social History  Substance Use  Topics  . Smoking status: Former Smoker    Years: 20.00    Types: Cigarettes    Quit date: 08/15/1996  . Smokeless tobacco: Never Used     Comment: smoked 2-3 cigarettes/day  . Alcohol use No     Allergies   Sertraline hcl; Codeine; Cyclobenzaprine hcl; Fentanyl; Furosemide; Gadolinium derivatives; Hydrocodone-acetaminophen; Iohexol; Levofloxacin; Lorazepam; Lubiprostone; Metformin; Metoclopramide; Morphine; Oxycodone; Promethazine hcl; Rosiglitazone; Sertraline hcl; and Verapamil   Review of Systems Review of Systems  Constitutional: Negative for activity change, chills, diaphoresis, fatigue and fever.  HENT: Negative for congestion and rhinorrhea.   Eyes: Positive for visual disturbance (Patient is blind).  Respiratory: Negative for cough, chest tightness, shortness of breath and stridor.   Cardiovascular: Negative for chest pain, palpitations and leg swelling.  Gastrointestinal: Positive for nausea and vomiting. Negative for abdominal distention, abdominal pain, constipation and diarrhea.  Genitourinary: Negative for difficulty urinating, dysuria, flank pain, frequency, hematuria, menstrual problem, pelvic pain, vaginal bleeding and vaginal discharge.  Musculoskeletal: Negative for back pain and neck pain.  Skin: Negative for rash and wound.  Neurological: Negative for dizziness, weakness, light-headedness, numbness and headaches.  Psychiatric/Behavioral: Negative for agitation and confusion.  All other systems reviewed and are negative.    Physical Exam Updated Vital Signs BP 182/85 (BP Location: Right Arm)   Pulse 98   Temp 98.6 F (37 C) (Oral)   Resp 18   Ht 5\' 4"  (1.626 m)   Wt 197 lb (89.4 kg)   SpO2 93%   BMI 33.81 kg/m   Physical Exam  Constitutional: She is oriented to person, place, and time. She appears well-developed and well-nourished. No distress.  HENT:  Head: Normocephalic and  atraumatic.  Right Ear: External ear normal.  Left Ear: External ear normal.  Nose: Nose normal.  Mouth/Throat: Oropharynx is clear and moist. No oropharyngeal exudate.  Eyes: Conjunctivae are normal.  Neck: Normal range of motion. Neck supple.  Cardiovascular: Normal rate, regular rhythm, normal heart sounds and intact distal pulses.   No murmur heard. Pulmonary/Chest: Effort normal and breath sounds normal. No stridor. No respiratory distress. She exhibits no tenderness.  Abdominal: She exhibits no distension. There is no tenderness. There is no rebound and no guarding.  Genitourinary: Rectal exam shows guaiac negative stool.  Musculoskeletal: She exhibits no tenderness.  Neurological: She is alert and oriented to person, place, and time. She has normal reflexes. No sensory deficit. She exhibits normal muscle tone. Coordination normal.  Skin: Skin is warm. Capillary refill takes less than 2 seconds. No rash noted. She is not diaphoretic. No erythema.  Psychiatric: She has a normal mood and affect.  Nursing note and vitals reviewed.    ED Treatments / Results  Labs (all labs ordered are listed, but only abnormal results are displayed) Labs Reviewed  COMPREHENSIVE METABOLIC PANEL - Abnormal; Notable for the following:       Result Value   Glucose, Bld 281 (*)    BUN 43 (*)    Creatinine, Ser 1.36 (*)    GFR calc non Af Amer 37 (*)    GFR calc Af Amer 43 (*)    All other components within normal limits  URINALYSIS, ROUTINE W REFLEX MICROSCOPIC - Abnormal; Notable for the following:    APPearance CLOUDY (*)    Glucose, UA 150 (*)    Hgb urine dipstick MODERATE (*)    Protein, ur >=300 (*)    Nitrite POSITIVE (*)    Leukocytes, UA TRACE (*)  Bacteria, UA MANY (*)    All other components within normal limits  HEMOGLOBIN AND HEMATOCRIT, BLOOD - Abnormal; Notable for the following:    Hemoglobin 11.6 (*)    HCT 34.7 (*)    All other components within normal limits  GLUCOSE,  CAPILLARY - Abnormal; Notable for the following:    Glucose-Capillary 162 (*)    All other components within normal limits  CBG MONITORING, ED - Abnormal; Notable for the following:    Glucose-Capillary 204 (*)    All other components within normal limits  MRSA PCR SCREENING  CBC  LIPASE, BLOOD  HEMOGLOBIN AND HEMATOCRIT, BLOOD  HEMOGLOBIN AND HEMATOCRIT, BLOOD  CBC  COMPREHENSIVE METABOLIC PANEL  POC OCCULT BLOOD, ED  I-STAT CG4 LACTIC ACID, ED  I-STAT CG4 LACTIC ACID, ED  TYPE AND SCREEN  ABO/RH    EKG  EKG Interpretation None       Radiology Dg Chest 2 View  Result Date: 01/21/2016 CLINICAL DATA:  Hematemesis EXAM: CHEST  2 VIEW COMPARISON:  02/04/2013 FINDINGS: There is mild bilateral interstitial thickening. There is no focal consolidation. There is no pleural effusion or pneumothorax. There is mild stable cardiomegaly. The osseous structures are unremarkable. IMPRESSION: 1. Cardiomegaly with mild pulmonary vascular congestion. Electronically Signed   By: Kathreen Devoid   On: 01/21/2016 11:47    Procedures Procedures (including critical care time)  Medications Ordered in ED Medications  atropine 1 % ophthalmic solution 1 drop (1 drop Both Eyes Given 01/21/16 1739)  calcium carbonate (OS-CAL - dosed in mg of elemental calcium) tablet 500 mg of elemental calcium (500 mg of elemental calcium Oral Given 01/21/16 1726)  lisinopril (PRINIVIL,ZESTRIL) tablet 5 mg (not administered)  metoprolol succinate (TOPROL-XL) 24 hr tablet 50 mg (50 mg Oral Given 01/21/16 1726)  pravastatin (PRAVACHOL) tablet 20 mg (20 mg Oral Given 01/21/16 1726)  prednisoLONE acetate (PRED FORTE) 1 % ophthalmic suspension 1 drop (1 drop Left Eye Given 01/21/16 1737)  QUEtiapine (SEROQUEL) tablet 50 mg (not administered)  rivastigmine (EXELON) capsule 1.5 mg (not administered)  tiZANidine (ZANAFLEX) tablet 2 mg (not administered)  tiZANidine (ZANAFLEX) tablet 2 mg (not administered)  traMADol (ULTRAM)  tablet 25 mg (25 mg Oral Given 01/21/16 1726)  carbidopa-levodopa (SINEMET IR) 25-100 MG per tablet immediate release 1 tablet (1 tablet Oral Given 01/21/16 1726)  loratadine (CLARITIN) tablet 10 mg (10 mg Oral Given 01/21/16 1726)  ALPRAZolam (XANAX) tablet 0.5 mg (not administered)  ipratropium-albuterol (DUONEB) 0.5-2.5 (3) MG/3ML nebulizer solution 3 mL (not administered)  anastrozole (ARIMIDEX) tablet 1 mg (1 mg Oral Given 01/21/16 1805)  cholecalciferol (VITAMIN D) tablet 2,000 Units (2,000 Units Oral Given 01/21/16 1726)  senna-docusate (Senokot-S) tablet 1 tablet (not administered)  DULoxetine (CYMBALTA) DR capsule 60 mg (not administered)  gabapentin (NEURONTIN) capsule 600 mg (not administered)  sodium chloride flush (NS) 0.9 % injection 3 mL (not administered)  sodium chloride flush (NS) 0.9 % injection 3 mL (not administered)  0.9 %  sodium chloride infusion (not administered)  acetaminophen (TYLENOL) tablet 650 mg (not administered)    Or  acetaminophen (TYLENOL) suppository 650 mg (not administered)  ondansetron (ZOFRAN) tablet 4 mg (not administered)    Or  ondansetron (ZOFRAN) injection 4 mg (not administered)  pantoprazole (PROTONIX) injection 40 mg (not administered)  insulin aspart (novoLOG) injection 0-9 Units (2 Units Subcutaneous Given 01/21/16 1744)  insulin detemir (LEVEMIR) injection 25 Units (not administered)  polyethylene glycol (MIRALAX / GLYCOLAX) packet 17 g (17 g Oral Given 01/21/16 1726)  cefTRIAXone (ROCEPHIN) 1 g in dextrose 5 % 50 mL IVPB (not administered)  sodium chloride 0.9 % bolus 500 mL (0 mLs Intravenous Stopped 01/21/16 1517)  pantoprazole (PROTONIX) injection 40 mg (40 mg Intravenous Given 01/21/16 0936)  acetaminophen (TYLENOL) tablet 650 mg (650 mg Oral Given 01/21/16 1241)  sodium chloride 0.9 % bolus 1,000 mL (1,000 mLs Intravenous New Bag/Given 01/21/16 1516)  cefTRIAXone (ROCEPHIN) 1 g in dextrose 5 % 50 mL IVPB (0 g Intravenous Stopped 01/21/16  1547)     Initial Impression / Assessment and Plan / ED Course  I have reviewed the triage vital signs and the nursing notes.  Pertinent labs & imaging results that were available during my care of the patient were reviewed by me and considered in my medical decision making (see chart for details).     HEALANI CORNISH is a 76 y.o. female with a past medical history significant for blindness, peripheral breast cancer, COPD, diabetes, asthma, hypertension, GERD, and peripheral neuropathy who presents with hematemesis and lightheadedness.  Family called facility and they report that patient had five episodes of coffee ground hematemesis.  It was heme positive on testing according to facility.  History and exam are seen above.  On exam, patient had no chest tenderness, no abdominal tenderness, and lungs were clear. Patient had sensation in all extremity. Patient had weakness in her legs which she reports is normal. Patient denies any changes in her neurologic exam. Rectal exam was performed and no gross blood was seen. Fecal occult card was sent.  Patient will have a type and screen to look for anemia. Given concern for GI bleed, patient will be given Protonix. Patient will have laboratory testing to look for etiology of hematemesis.  Laboratory testing shows evidence of urinary tract infection and elevated creatinine. Rocephin ordered.  Hemoglobin Initially found to be 12.9.   Gastroenterology called and they recommended admission to hospitalist service for treatment of UTI and NPO at midnight to allow possible endoscopy tomorrow. Patient was given some fluids after she was found to have worsening heart rate and fever. Tylenol given for fever.  Patient admitted to hospitalist service in stable condition.   Final Clinical Impressions(s) / ED Diagnoses   Final diagnoses:  Hematemesis with nausea  Acute cystitis with hematuria  Tachycardia  Fever, unspecified fever cause    New  Prescriptions Current Discharge Medication List      Clinical Impression: 1. Hematemesis with nausea   2. Acute cystitis with hematuria   3. Tachycardia   4. Fever, unspecified fever cause     Disposition: Admit to Hospitalist service    Courtney Paris, MD 01/21/16 1949

## 2016-01-21 NOTE — Progress Notes (Signed)
Patient's vitals were: 99.36F;HR102;RR20;172/63;95% Room Air.  PCP on call was notified.  RN requested an order for medication. Awaiting new orders.

## 2016-01-21 NOTE — Progress Notes (Signed)
Pt arrived from ED on stretcher, slid to bed w/ 5 assist. VSS. Denies pain/nausea. Pt oriented to touch callbell and environment. POC discussed.

## 2016-01-21 NOTE — ED Triage Notes (Signed)
Per EMS pt from [facility] for evaluated of hematemesis onset last night; 5 episodes. Pt hx of dementia; no complaints.

## 2016-01-21 NOTE — H&P (Signed)
History and Physical    [name] [id number] DOB: 17-Aug-1940 DOA: 01/21/2016  PCP: Wenda Low, MD   Patient coming from: Nursing Home   Chief Complaint: Hematemesis and abdominal pain.   HPI: Katherine Carr is a 76 y.o. female with medical history significant of CVA with left sided weakness who presents with the chief complain of hematemesis. Patiens has been complaining of abdominal pain, for the last 3 to 4 days, epigastric, constant, 10/10 intensity, sharp in nature, improve with pain medications, no worsening factors. Today the pain was associated with nausea and 3 episodes of hematemesis. She was transferred to the hospital for further evaluation.   Patient also complains of dysuria and increase urinary frequency. Patient is blind and non ambulatory and depends on assistance for activities of daily living, including bathing and feeding.   ED Course: Patient found hemodynamic stable, tested positive for UTI. Contacted GI, for consultation.   Review of Systems:  1. General. No fevers or chills 2. ENT no runny nose or sore throat 3. Pulmonary no shortness of breath cough or hemoptysis 4. Gastrointestinal positive for nausea and hematemesis as mentioned in history present illness. Positive abdominal pain. 5. Cardiovascular. No angina, no claudication, no PND orthopnea 6. Dermatology no rashes 7. Musculoskeletal no joint pain 8. Hematology no easy bruisability or frequent infections 9. Endocrine a tremors, heat or cold intolerance 10. Urology positive for dysuria and increased urinary frequency  Past Medical History:  Diagnosis Date  . Anxiety   . Asthma    bronchial  . Breast cancer ([place])    "both sides" (10/29/2012)  . COPD (chronic obstructive pulmonary disease) ([place])   . Depression   . Diabetes mellitus   . Diverticulosis   . Fatty liver    hx: of  . Fibromyalgia   . Fibrous breast lumps   . GERD (gastroesophageal reflux disease)   . H/O hiatal  hernia    Archie Endo 09/12/2007  (10/29/2012)  . Herpes   . Hypertension   . Left carotid stenosis    Hx: of  . Migraines    Archie Endo 09/01/2001 (10/29/2012)  . Osteoarthritis    Archie Endo 09/12/2007  (10/29/2012)  . Ovarian cyst   . Peripheral neuropathy ([place])   . Urinary incontinence   . Wears glasses     Past Surgical History:  Procedure Laterality Date  . ABDOMINAL HYSTERECTOMY    . ANTERIOR CERVICAL DECOMP/DISCECTOMY FUSION  09/2003   Archie Endo 09/25/2003 (10/29/2012)  . APPENDECTOMY    . BACK SURGERY     central decompression L5-S1/notes 10/23/2005 (10/29/2012)  . BREAST DUCTAL SYSTEM EXCISION Left    Archie Endo 04/02/2012  (10/29/2012)  . BREAST LUMPECTOMY Right 04/21/2012   Procedure: Right breast LUMPECTOMY with re-excision of margins;  Surgeon: Adin Hector, MD;  Location: [place];  Service: General;  Laterality: Right;  . BREAST LUMPECTOMY WITH NEEDLE LOCALIZATION Right 04/02/2012   Procedure: Excision Ductal System Right Breast with Needle Localization;  Surgeon: Adin Hector, MD;  Location: [facility];  Service: General;  Laterality: Right;  needle localization at 7:30 at [facility]   . CARPAL TUNNEL RELEASE Bilateral   . CESAREAN SECTION    . MYOMECTOMY    . NEVUS EXCISION Left 04/02/2012   Procedure: Excision Nevus Left Abdominal Wall;  Surgeon: Adin Hector, MD;  Location: [place];  Service: General;  Laterality: Left;  . TONSILLECTOMY       reports that she quit smoking about 19 years ago.  Her smoking use included Cigarettes. She quit after 20.00 years of use. She has never used smokeless tobacco. She reports that she does not drink alcohol or use drugs.  Allergies  Allergen Reactions  . Sertraline Hcl Hives  . Codeine Hives and Swelling  . Cyclobenzaprine Swelling and Hives  . Cyclobenzaprine Hcl Hives and Swelling  . Fentanyl     REACTION: PANIC ATTACKS  . Furosemide     REACTION: face was swollen and hand  . Gadolinium Derivatives Swelling    . Hydrocodone-Acetaminophen   . Iohexol      Code: SOB, Desc: xray dye, Onset Date: [id number]   . Levofloxacin     REACTION: PANIC ATTACK,DIARRHEA,EYES DISCOLORD  . Lorazepam Nausea And Vomiting  . Lubiprostone     REACTION: REALLY BAD NAUSEA  . Metformin Hives and Swelling  . Metoclopramide Nausea And Vomiting and Swelling  . Morphine     REACTION: itching, panic attacks  . Oxycodone Hives and Swelling  . Promethazine Hcl     REACTION: TRAMA ANDJITTERS IN MY BODY  . Rosiglitazone Hives and Swelling  . Sertraline Hcl Hives and Swelling  . Verapamil Hives and Nausea And Vomiting    Family History  Problem Relation Age of Onset  . Heart disease Mother   . Heart disease Brother   . Diabetes Brother   . Arthritis Sister   . Stroke Sister   . Cirrhosis Brother   . Cancer Brother     Bladder cancer   Unacceptable: Noncontributory, unremarkable, or negative. Acceptable: Family history reviewed and not pertinent (If you reviewed it)  Prior to Admission medications   Medication Sig Start Date End Date Taking? Authorizing Provider  acetaminophen (TYLENOL) 325 MG tablet Take 650 mg by mouth every 4 (four) hours as needed for moderate pain.   Yes Historical Provider, MD  ALPRAZolam Duanne Moron) 0.5 MG tablet Take 0.5 mg by mouth at bedtime as needed for anxiety.   Yes Historical Provider, MD  anastrozole (ARIMIDEX) 1 MG tablet Take 1 mg by mouth daily.   Yes Historical Provider, MD  atropine 1 % ophthalmic solution Place 1 drop into both eyes daily   Yes Historical Provider, MD  calcium carbonate (OS-CAL - DOSED IN MG OF ELEMENTAL CALCIUM) 1250 (500 Ca) MG tablet Take 1 tablet by mouth 3 (three) times daily with meals.    Yes Historical Provider, MD  carbidopa-levodopa (SINEMET) 25-100 MG tablet Take 1/2 tab twice daily x 3 days, then 1/2 tab three times daily x 4 days, then 1 tab three times daily Patient taking differently: Take 1 tablet by mouth 3 (three) times daily.  11/24/14  Yes  Pieter Partridge, DO  Cholecalciferol (VITAMIN D3) 1000 UNITS CAPS Take 2,000 Units by mouth daily.   Yes Historical Provider, MD  clopidogrel (PLAVIX) 75 MG tablet Take 75 mg by mouth daily.  01/08/16  Yes Historical Provider, MD  Cranberry 450 MG TABS Take 450 mg by mouth 2 (two) times daily.    Yes Historical Provider, MD  diclofenac sodium (VOLTAREN) 1 % GEL Apply 4 g topically 3 (three) times daily as needed (joint pain).   Yes Historical Provider, MD  docusate sodium 100 MG CAPS Take 100 mg by mouth 2 (two) times daily. 02/06/13  Yes Bobby Rumpf York, PA-C  DULoxetine (CYMBALTA) 60 MG capsule Take 60 mg by mouth 2 (two) times daily.   Yes Historical Provider, MD  fluticasone (FLONASE) 50 MCG/ACT nasal spray Place 2 sprays into both nostrils  daily as needed for rhinitis or allergies. 02/06/13  Yes Bobby Rumpf York, PA-C  furosemide (LASIX) 20 MG tablet Take 20 mg by mouth daily.  12/29/15  Yes Historical Provider, MD  gabapentin (NEURONTIN) 600 MG tablet Take 600 mg by mouth 2 (two) times daily.    Yes Historical Provider, MD  insulin detemir (LEVEMIR) 100 unit/ml SOLN Inject 50 Units into the skin at bedtime.   Yes Historical Provider, MD  insulin lispro (HUMALOG) 100 UNIT/ML injection Inject 60-80 Units into the skin 3 (three) times daily with meals. Inject 80 units at 8AM and noon and inject 60 units at supper time   Yes Historical Provider, MD  ipratropium-albuterol (DUONEB) 0.5-2.5 (3) MG/3ML SOLN Take 3 mLs by nebulization every 6 (six) hours as needed (shortness of breath). 02/06/13  Yes Bobby Rumpf York, PA-C  lisinopril (PRINIVIL,ZESTRIL) 5 MG tablet Take 5 mg by mouth 2 (two) times daily.  01/10/16  Yes Historical Provider, MD  loratadine (CLARITIN) 10 MG tablet Take 10 mg by mouth daily.   Yes Historical Provider, MD  Melatonin 3 MG TABS Take 3 mg by mouth at bedtime.   Yes Historical Provider, MD  Menthol, Topical Analgesic, (BIOFREEZE) 4 % GEL Apply 1 application topically 2 (two) times daily.   Yes  Historical Provider, MD  metoprolol succinate (TOPROL-XL) 50 MG 24 hr tablet  01/06/16  Yes Historical Provider, MD  ondansetron (ZOFRAN) 4 MG tablet Take 4 mg by mouth every 8 (eight) hours as needed for nausea or vomiting.   Yes Historical Provider, MD  phenol (SORE THROAT SPRAY) 1.4 % LIQD Use as directed 1 spray in the mouth or throat every 4 (four) hours as needed for throat irritation / pain.   Yes Historical Provider, MD  polyethylene glycol powder (GLYCOLAX/MIRALAX) powder Take 17 g by mouth daily.  01/06/16  Yes Historical Provider, MD  potassium chloride (K-DUR,KLOR-CON) 10 MEQ tablet Take 10 mEq by mouth daily.  12/09/15  Yes Historical Provider, MD  pravastatin (PRAVACHOL) 20 MG tablet Take 20 mg by mouth daily.  12/27/15  Yes Historical Provider, MD  prednisoLONE acetate (PRED FORTE) 1 % ophthalmic suspension Place 1 drop into the left eye daily.  01/10/16  Yes Historical Provider, MD  QUEtiapine (SEROQUEL) 50 MG tablet Take 50 mg by mouth at bedtime.    Yes Historical Provider, MD  rivastigmine (EXELON) 1.5 MG capsule Take 1.5 mg by mouth 2 (two) times daily.  01/01/16  Yes Historical Provider, MD  Sennosides-Docusate Sodium (SENNA S PO) Take 1 tablet by mouth 2 (two) times daily.    Yes Historical Provider, MD  tiZANidine (ZANAFLEX) 2 MG tablet Take 2 mg by mouth 2 (two) times daily.  01/10/16  Yes Historical Provider, MD  tiZANidine (ZANAFLEX) 2 MG tablet Take 2 mg by mouth every 8 (eight) hours as needed for muscle spasms.   Yes Historical Provider, MD  traMADol (ULTRAM) 50 MG tablet Take 25 mg by mouth every 6 (six) hours as needed for moderate pain.  11/09/15  Yes Historical Provider, MD  bisacodyl (DULCOLAX) 10 MG suppository Place 1 suppository (10 mg total) rectally daily as needed for moderate constipation. Patient not taking: Reported on 01/21/2016 02/06/13   Melton Alar, PA-C  cefUROXime (CEFTIN) 500 MG tablet Take 1 tablet (500 mg total) by mouth 2 (two) times daily with a  meal. Patient not taking: Reported on 01/21/2016 02/06/13   Melton Alar, PA-C  HYDROcodone-acetaminophen (NORCO/VICODIN) 5-325 MG per tablet Take 2 tablets  by mouth every 6 (six) hours as needed. Patient not taking: Reported on 01/21/2016 02/06/13   Melton Alar, PA-C  hydrOXYzine (ATARAX/VISTARIL) 10 MG tablet Take 1 tablet (10 mg total) by mouth every 8 (eight) hours as needed for itching. Patient not taking: Reported on 01/21/2016 02/06/13   Melton Alar, PA-C  metroNIDAZOLE (FLAGYL) 500 MG tablet Take 1 tablet (500 mg total) by mouth 3 (three) times daily. Patient not taking: Reported on 01/21/2016 02/06/13   Melton Alar, PA-C  QUEtiapine (SEROQUEL) 25 MG tablet Take 1 tablet (25 mg total) by mouth at bedtime. Patient not taking: Reported on 01/21/2016 12/10/14   Pieter Partridge, DO  rivastigmine (EXELON) 3 MG capsule Take 1 tablet twice daily with food. Patient not taking: Reported on 01/21/2016 02/09/15   Pieter Partridge, DO  rivastigmine (EXELON) 4.6 mg/24hr Place 1 patch (4.6 mg total) onto the skin daily. Patient not taking: Reported on 01/21/2016 12/28/14   Pieter Partridge, DO  temazepam (RESTORIL) 15 MG capsule Take 1 capsule (15 mg total) by mouth at bedtime as needed (for insomnia). Patient not taking: Reported on 01/21/2016 02/06/13   Melton Alar, PA-C    Physical Exam: Vitals:   01/21/16 1230 01/21/16 1400 01/21/16 1500 01/21/16 1521  BP: 179/79 142/70 149/67 158/79  Pulse: 111     Resp:  14 18 17   Temp:      TempSrc:      SpO2: 94%     Weight:      Height:          Constitutional: deconditioned and ill looking appearing Vitals:   01/21/16 1230 01/21/16 1400 01/21/16 1500 01/21/16 1521  BP: 179/79 142/70 149/67 158/79  Pulse: 111     Resp:  14 18 17   Temp:      TempSrc:      SpO2: 94%     Weight:      Height:       Eyes: PERRL, lids and conjunctivae is pale with no icterus.  Head normocephalic, nose and ears no deformities.  ENMT: Mucous membranes are dry. Posterior  pharynx clear of any exudate or lesions.Normal dentition.  Neck: normal, supple, no masses, no thyromegaly Respiratory: clear to auscultation bilaterally, no wheezing, no crackles.Poor inspiratory effort. No accessory muscle use. Mild decreased breath sounds at bases.  Cardiovascular: Regular rate and rhythm, no murmurs / rubs / gallops. No extremity edema. 2+ pedal pulses. No carotid bruits.  Abdomen:  Protuberant, tender to deep palpation at the epigastrium, mid distention, no masses palpated. No hepatosplenomegaly. Bowel sounds positive.  Musculoskeletal: no clubbing / cyanosis. No joint deformity upper and lower extremities. Good ROM, no contractures. Normal muscle tone.  Skin: no rashes, lesions, ulcers. No induration Neurologic: CN 2-12 grossly intact. Sensation intact, DTR normal. Strength 5/5 in all 4.    Labs on Admission: I have personally reviewed following labs and imaging studies  CBC:  Recent Labs Lab 01/21/16 0810  WBC 9.3  HGB 12.9  HCT 38.2  MCV 92.7  PLT 0000000   Basic Metabolic Panel:  Recent Labs Lab 01/21/16 0810  NA 141  K 4.4  CL 102  CO2 30  GLUCOSE 281*  BUN 43*  CREATININE 1.36*  CALCIUM 8.9   GFR: Estimated Creatinine Clearance: 39.3 mL/min (by C-G formula based on SCr of 1.36 mg/dL (H)). Liver Function Tests:  Recent Labs Lab 01/21/16 0810  AST 18  ALT 16  ALKPHOS 109  BILITOT 0.8  PROT  6.7  ALBUMIN 3.6    Recent Labs Lab 01/21/16 0830  LIPASE 46   No results for input(s): AMMONIA in the last 168 hours. Coagulation Profile: No results for input(s): INR, PROTIME in the last 168 hours. Cardiac Enzymes: No results for input(s): CKTOTAL, CKMB, CKMBINDEX, TROPONINI in the last 168 hours. BNP (last 3 results) No results for input(s): PROBNP in the last 8760 hours. HbA1C: No results for input(s): HGBA1C in the last 72 hours. CBG:  Recent Labs Lab 01/21/16 1411  GLUCAP 204*   Lipid Profile: No results for input(s): CHOL, HDL,  LDLCALC, TRIG, CHOLHDL, LDLDIRECT in the last 72 hours. Thyroid Function Tests: No results for input(s): TSH, T4TOTAL, FREET4, T3FREE, THYROIDAB in the last 72 hours. Anemia Panel: No results for input(s): VITAMINB12, FOLATE, FERRITIN, TIBC, IRON, RETICCTPCT in the last 72 hours. Urine analysis:    Component Value Date/Time   COLORURINE YELLOW 01/21/2016 1210   APPEARANCEUR CLOUDY (A) 01/21/2016 1210   LABSPEC 1.019 01/21/2016 1210   PHURINE 5.0 01/21/2016 1210   GLUCOSEU 150 (A) 01/21/2016 1210   GLUCOSEU > or = 1000 mg/dL (AA) 09/09/2007 1431   HGBUR MODERATE (A) 01/21/2016 1210   HGBUR negative 01/27/2008 1543   BILIRUBINUR NEGATIVE 01/21/2016 1210   KETONESUR NEGATIVE 01/21/2016 1210   PROTEINUR >=300 (A) 01/21/2016 1210   UROBILINOGEN 1.0 02/04/2013 2309   NITRITE POSITIVE (A) 01/21/2016 1210   LEUKOCYTESUR TRACE (A) 01/21/2016 1210   Sepsis Labs: !!!!!!!!!!!!!!!!!!!!!!!!!!!!!!!!!!!!!!!!!!!! @LABRCNTIP (procalcitonin:4,lacticidven:4) )No results found for this or any previous visit (from the past 240 hour(s)).   Radiological Exams on Admission: Dg Chest 2 View  Result Date: 01/21/2016 CLINICAL DATA:  Hematemesis EXAM: CHEST  2 VIEW COMPARISON:  02/04/2013 FINDINGS: There is mild bilateral interstitial thickening. There is no focal consolidation. There is no pleural effusion or pneumothorax. There is mild stable cardiomegaly. The osseous structures are unremarkable. IMPRESSION: 1. Cardiomegaly with mild pulmonary vascular congestion. Electronically Signed   By: Kathreen Devoid   On: 01/21/2016 11:47    EKG: Independently reviewed. NA Assessment/Plan Active Problems:   Hematemesis   This is a 76 year old female who presents from the nursing home after 3 episodes of hematemesis. She has been complaining of abdominal pain for the last 3-4 days. She is bedbound and blind requires assistance for her activities of daily living. On the initial physical examination her blood pressure is  160/53, temperature 98.7, heart rate 102, respiratory rate 18, oxygen saturation 95% on room air. Her conjunctiva is mildly pale, heart S1-S2 present rhythmic, her lungs are fairly clear to auscultation. Abdomen is protuberant, tender to palpation in epigastric region. Sodium 141, potassium 4.4, chloride 102, bicarbonate 30, glucose 281, but 43, creatinine 1.36, calcium 8.9, white count 9.3, hemoglobin 12.9, hematocrit 38.2, platelets 162. Urine with 6-30 white cells, more than 300 protein, too numerous to count RBCs, positive nitrates. Chest film AP and lateral bursa review showing rotation right side, bibasal atelectasis.  Working diagnosis, hematemesis to rule out peptic ulcer disease complicated by urinary tract infection.  1. Hematemesis. Suspected upper GI bleed, will place patient on a clear liquid diet, will start patient on Protonix 40 mg twice daily, GI has been informed about patient  hospitalized. Will follow H&H every 6 hours. Supportive IV fluids. Will hold Plavix.  2. Urinary tract infection. Will place patient on ceftriaxone, will follow-up on cultures, cell count and temperature curve.  3. Parkinsonism. Continue Sinemet  4. Depression. Continue duloxetine, gabapentin, melatonin, Seroquel, rivastigmine, Zanaflex.  5. Hypertension. Continue  metoprolol and lisinopril for blood pressure control.  6. Type 2 diabetes mellitus. Will decrease dose of long-acting insulin to 25 units a month continue insulin sliding scale for glucose coverage and monitoring.  DVT prophylaxis: scd Code Status: DNR  Family Communication: I spoke with patient's POA at the bedside  Disposition Plan: SNF Consults called: GI per ED  Admission status: Inpatient   Serafin Decatur Gerome Apley MD [facility] Pager [phone]  If 7PM-7AM, please contact night-coverage [email or web address] Password [email or web address]  01/21/2016, 3:25 PM

## 2016-01-21 NOTE — ED Notes (Signed)
ADMITTING MD Provider at bedside. 

## 2016-01-21 NOTE — Progress Notes (Signed)
PHARMACIST - PHYSICIAN ORDER COMMUNICATION  CONCERNING: P&T Medication Policy on Herbal Medications  DESCRIPTION:  This patient's order for:  Melatonin  has been noted.  This product(s) is classified as an "herbal" or natural product. Due to a lack of definitive safety studies or FDA approval, nonstandard manufacturing practices, plus the potential risk of unknown drug-drug interactions while on inpatient medications, the Pharmacy and Therapeutics Committee does not permit the use of "herbal" or natural products of this type within [facility].   ACTION TAKEN: The pharmacy department is unable to verify this order at this time and your patient has been informed of this safety policy. Please reevaluate patient's clinical condition at discharge and address if the herbal or natural product(s) should be resumed at that time.   Thanks Dorrene German 01/21/2016 4:33 PM

## 2016-01-22 DIAGNOSIS — K920 Hematemesis: Secondary | ICD-10-CM | POA: Diagnosis not present

## 2016-01-22 DIAGNOSIS — R509 Fever, unspecified: Secondary | ICD-10-CM

## 2016-01-22 DIAGNOSIS — N3001 Acute cystitis with hematuria: Secondary | ICD-10-CM | POA: Diagnosis not present

## 2016-01-22 DIAGNOSIS — K922 Gastrointestinal hemorrhage, unspecified: Secondary | ICD-10-CM | POA: Diagnosis not present

## 2016-01-22 DIAGNOSIS — R000 Tachycardia, unspecified: Secondary | ICD-10-CM

## 2016-01-22 DIAGNOSIS — K2211 Ulcer of esophagus with bleeding: Secondary | ICD-10-CM | POA: Diagnosis not present

## 2016-01-22 DIAGNOSIS — K2210 Ulcer of esophagus without bleeding: Secondary | ICD-10-CM | POA: Diagnosis not present

## 2016-01-22 LAB — COMPREHENSIVE METABOLIC PANEL
ALBUMIN: 3.4 g/dL — AB (ref 3.5–5.0)
ALK PHOS: 86 U/L (ref 38–126)
ALT: 7 U/L — AB (ref 14–54)
ANION GAP: 6 (ref 5–15)
AST: 15 U/L (ref 15–41)
BILIRUBIN TOTAL: 0.6 mg/dL (ref 0.3–1.2)
BUN: 39 mg/dL — AB (ref 6–20)
CALCIUM: 8.3 mg/dL — AB (ref 8.9–10.3)
CO2: 31 mmol/L (ref 22–32)
CREATININE: 1.47 mg/dL — AB (ref 0.44–1.00)
Chloride: 102 mmol/L (ref 101–111)
GFR calc Af Amer: 39 mL/min — ABNORMAL LOW (ref >60)
GFR calc non Af Amer: 34 mL/min — ABNORMAL LOW (ref >60)
GLUCOSE: 150 mg/dL — AB (ref 65–99)
Potassium: 4.1 mmol/L (ref 3.5–5.1)
Sodium: 139 mmol/L (ref 135–145)
TOTAL PROTEIN: 5.4 g/dL — AB (ref 6.5–8.1)

## 2016-01-22 LAB — CBC
HEMATOCRIT: 30.8 % — AB (ref 36.0–46.0)
HEMOGLOBIN: 10.2 g/dL — AB (ref 12.0–15.0)
MCH: 31.1 pg (ref 26.0–34.0)
MCHC: 33.1 g/dL (ref 30.0–36.0)
MCV: 93.9 fL (ref 78.0–100.0)
Platelets: 128 10*3/uL — ABNORMAL LOW (ref 150–400)
RBC: 3.28 MIL/uL — AB (ref 3.87–5.11)
RDW: 14.7 % (ref 11.5–15.5)
WBC: 4.7 10*3/uL (ref 4.0–10.5)

## 2016-01-22 LAB — HEMOGLOBIN AND HEMATOCRIT, BLOOD
HCT: 30.6 % — ABNORMAL LOW (ref 36.0–46.0)
HCT: 32.1 % — ABNORMAL LOW (ref 36.0–46.0)
HEMOGLOBIN: 10.2 g/dL — AB (ref 12.0–15.0)
Hemoglobin: 10.7 g/dL — ABNORMAL LOW (ref 12.0–15.0)

## 2016-01-22 LAB — GLUCOSE, CAPILLARY
Glucose-Capillary: 85 mg/dL (ref 65–99)
Glucose-Capillary: 131 mg/dL — ABNORMAL HIGH (ref 65–99)
Glucose-Capillary: 139 mg/dL — ABNORMAL HIGH (ref 65–99)
Glucose-Capillary: 178 mg/dL — ABNORMAL HIGH (ref 65–99)

## 2016-01-22 NOTE — Progress Notes (Signed)
PROGRESS NOTE    Katherine Carr  [id number] DOB: 01-20-1940 DOA: 01/21/2016 PCP: Wenda Low, MD  Brief Narrative:  Katherine Carr is a 76 y.o. female with medical history significant of CVA with left sided weakness  ando other comorbidities who presents with the chief complain of hematemesis. Patiens has been complaining of abdominal pain, for the last 3 to 4 days, epigastric, constant, 10/10 intensity, sharp in nature, improve with pain medications, no worsening factors. Today the pain was associated with nausea and 3 episodes of hematemesis. She was transferred to the hospital for further evaluation. Patient also complained of dysuria and increase urinary frequency. Patient is blind and non ambulatory and depends on assistance for activities of daily living, including bathing and feeding. She was worked up and admitted for Hematemesis and Urinary Tract Infection.  Assessment & Plan:   Active Problems:   Hematemesis   Upper GI bleed  1. Hematemesis/Upper GI Bleed -Patient had multiple episodes of Coffee Ground Emesis -Appreciate [facility] for Recc's and further Evaluation -Patient to go for EGD in AM -Full Liquid Diet now and NPO After Midnight -C/w Protonix IV q12h -Suspected upper GI bleed,  -Hb/Hct Dropped 2 grams but stable now at 10.7/32.1. Supportive IV fluids. Will hold Plavix. -C/w Zofran 4 mg po or IV q6hprn -Repeat CBC in AM  2. Urinary Tract Infection.  -Urinalysis showed Many Bacteria, Trace Leukocytes, Positive Nitrites, and TNCT WBC with symptoms -Urine Cx not ordered on Admission, Will order Urine Culture -Will place patient on IV ceftriaxone and continue to Monitor closely  3. Parkinsonism.  -Continue Sinemet 25-100 mg po TID  4. Depression/Fibromyalagia/Anxiety.  -Continue Duloxetine, gabapentin, melatonin, Seroquel, rivastigmine, Zanaflex.  5. Hypertension.  -Continue Metoprolol 50 mg, Lisinopril 5 mg po BID  for blood pressure  control.  6. Hyperlipidemia -Continue with Pravastatin 20 mg po Daily  7. Type 2 Diabetes Mellitus.  -Will decrease dose of long-acting insulin to 25 units sq Daily -continue Sensitive Novolog insulin sliding scale for glucose coverage and monitoring.  8. Hx of Breast Cancer -Continue Anastrozole 1 mg po Daily  DVT prophylaxis: SCDs Code Status: FULL CODE Family Communication: No family at bedside but friend at Bedside Disposition Plan: EGD in AM and back to SNF once stable  Consultants:   Gastroenterology   Procedures: EGD in AM   Antimicrobials:  Anti-infectives    Start     Dose/Rate Route Frequency Ordered Stop   01/22/16 1500  cefTRIAXone (ROCEPHIN) 1 g in dextrose 5 % 50 mL IVPB     1 g 100 mL/hr over 30 Minutes Intravenous Every 24 hours 01/21/16 1728     01/21/16 1500  cefTRIAXone (ROCEPHIN) 1 g in dextrose 5 % 50 mL IVPB     1 g 100 mL/hr over 30 Minutes Intravenous  Once 01/21/16 1450 01/21/16 1547      Subjective: Seen and examined and stated she was hungry. Had mild abdominal pain as well. No reoccurrence of vomiting. No CP or SOB.   Objective: Vitals:   01/21/16 1600 01/21/16 1622 01/21/16 2116 01/22/16 0502  BP: 158/74 (!) 160/53 (!) 172/63 (!) 108/48  Pulse: 87 (!) 102 (!) 102 62  Resp: 18 18 20 18   Temp:  98.7 F (37.1 C) 99.8 F (37.7 C) 99.1 F (37.3 C)  TempSrc:  Oral Oral Oral  SpO2: 95% 95% 95% 98%  Weight:      Height:        Intake/Output Summary (Last 24 hours) at  01/22/16 1410 Last data filed at 01/22/16 1100  Gross per 24 hour  Intake             2010 ml  Output                0 ml  Net             2010 ml   Filed Weights   01/21/16 0834  Weight: 89.4 kg (197 lb)    Examination: Physical Exam:   Constitutional: Obese WF who is essentially bedbound and blind ENMT: External Ears, Nose appear normal. Grossly normal hearing.  Neck: Appears normal, supple, no cervical masses, normal ROM, no appreciable thyromegaly, no  JVD Respiratory: Diminished to auscultation bilaterally, no wheezing, rales, rhonchi or crackles. Normal respiratory effort and patient is not tachypenic. No accessory muscle use.  Cardiovascular: RRR, no murmurs / rubs / gallops. S1 and S2 auscultated.  Abdomen: Soft, non-tender, non-distended. No masses palpated. No appreciable hepatosplenomegaly. Bowel sounds positive x4.  GU: Deferred. Musculoskeletal: No clubbing / cyanosis of digits/nails.  Skin: No rashes, lesions, ulcers on limited skin evaluation No induration; Warm and dry.  Neurologic: Left sided weakeness on exam. Had a little bit of contractures. Romberg sign cerebellar reflexes not assessed.  Psychiatric: Normal judgment and insight. Alert and awake. Agitated mood and appropriate affect.   Data Reviewed: I have personally reviewed following labs and imaging studies  CBC:  Recent Labs Lab 01/21/16 0810 01/21/16 1701 01/21/16 2242 01/22/16 0536 01/22/16 1201  WBC 9.3  --   --  4.7  --   HGB 12.9 11.6* 10.0* 10.2*  10.2* 10.7*  HCT 38.2 34.7* 29.3* 30.8*  30.6* 32.1*  MCV 92.7  --   --  93.9  --   PLT 162  --   --  128*  --    Basic Metabolic Panel:  Recent Labs Lab 01/21/16 0810 01/22/16 0536  NA 141 139  K 4.4 4.1  CL 102 102  CO2 30 31  GLUCOSE 281* 150*  BUN 43* 39*  CREATININE 1.36* 1.47*  CALCIUM 8.9 8.3*   GFR: Estimated Creatinine Clearance: 36.4 mL/min (by C-G formula based on SCr of 1.47 mg/dL (H)). Liver Function Tests:  Recent Labs Lab 01/21/16 0810 01/22/16 0536  AST 18 15  ALT 16 7*  ALKPHOS 109 86  BILITOT 0.8 0.6  PROT 6.7 5.4*  ALBUMIN 3.6 3.4*    Recent Labs Lab 01/21/16 0830  LIPASE 46   No results for input(s): AMMONIA in the last 168 hours. Coagulation Profile: No results for input(s): INR, PROTIME in the last 168 hours. Cardiac Enzymes: No results for input(s): CKTOTAL, CKMB, CKMBINDEX, TROPONINI in the last 168 hours. BNP (last 3 results) No results for input(s):  PROBNP in the last 8760 hours. HbA1C: No results for input(s): HGBA1C in the last 72 hours. CBG:  Recent Labs Lab 01/21/16 1411 01/21/16 1633 01/21/16 2051 01/22/16 1005 01/22/16 1137  GLUCAP 204* 162* 278* 85 131*   Lipid Profile: No results for input(s): CHOL, HDL, LDLCALC, TRIG, CHOLHDL, LDLDIRECT in the last 72 hours. Thyroid Function Tests: No results for input(s): TSH, T4TOTAL, FREET4, T3FREE, THYROIDAB in the last 72 hours. Anemia Panel: No results for input(s): VITAMINB12, FOLATE, FERRITIN, TIBC, IRON, RETICCTPCT in the last 72 hours. Sepsis Labs:  Recent Labs Lab 01/21/16 0901 01/21/16 1227  LATICACIDVEN 1.59 1.58    Recent Results (from the past 240 hour(s))  MRSA PCR Screening     Status: None  Collection Time: 01/21/16  5:09 PM  Result Value Ref Range Status   MRSA by PCR NEGATIVE NEGATIVE Final    Comment:        The GeneXpert MRSA Assay (FDA approved for NASAL specimens only), is one component of a comprehensive MRSA colonization surveillance program. It is not intended to diagnose MRSA infection nor to guide or monitor treatment for MRSA infections.     Radiology Studies: Dg Chest 2 View  Result Date: 01/21/2016 CLINICAL DATA:  Hematemesis EXAM: CHEST  2 VIEW COMPARISON:  02/04/2013 FINDINGS: There is mild bilateral interstitial thickening. There is no focal consolidation. There is no pleural effusion or pneumothorax. There is mild stable cardiomegaly. The osseous structures are unremarkable. IMPRESSION: 1. Cardiomegaly with mild pulmonary vascular congestion. Electronically Signed   By: Kathreen Devoid   On: 01/21/2016 11:47   Scheduled Meds: . anastrozole  1 mg Oral Daily  . atropine  1 drop Both Eyes Daily  . calcium carbonate  1 tablet Oral TID WC  . carbidopa-levodopa  1 tablet Oral TID  . cefTRIAXone (ROCEPHIN)  IV  1 g Intravenous Q24H  . cholecalciferol  2,000 Units Oral Daily  . DULoxetine  60 mg Oral BID  . gabapentin  600 mg Oral BID    . insulin aspart  0-9 Units Subcutaneous TID WC  . insulin detemir  25 Units Subcutaneous QHS  . lisinopril  5 mg Oral BID  . loratadine  10 mg Oral Daily  . metoprolol succinate  50 mg Oral Daily  . pantoprazole (PROTONIX) IV  40 mg Intravenous Q12H  . polyethylene glycol  17 g Oral Daily  . pravastatin  20 mg Oral Daily  . prednisoLONE acetate  1 drop Left Eye Daily  . QUEtiapine  50 mg Oral QHS  . rivastigmine  1.5 mg Oral BID  . senna-docusate  1 tablet Oral BID  . sodium chloride flush  3 mL Intravenous Q12H  . tiZANidine  2 mg Oral BID   Continuous Infusions:   LOS: 1 day   Kerney Elbe, DO [facility] Pager [phone]  If 7PM-7AM, please contact night-coverage [email or web address] Password [email or web address] 01/22/2016, 2:10 PM

## 2016-01-22 NOTE — Consult Note (Signed)
[facility] Consult Note  Referring Provider: No ref. provider found Primary Care Physician:  Wenda Low, MD Primary Gastroenterologist:  Dr.  Laurel Dimmer Complaint: Coffee-ground emesis HPI: Katherine Carr is an 76 y.o. white female  nursing home resident transferred due to staff witnessing to 3 episodes of emesis described as coffee ground in appearance which was gastric occult positive. She had initial hemoglobin of 12.9 slightly elevated BUNs and creatinine and heme-negative stool in the emergency room. She has a history of gastroesophageal reflux and hiatal hernia. She was complaining of some abdominal pain today but states the both the pain and nausea are better today. She is not had anymore emesis or stool since admission and states she is hungry. She is not on any blood thinners.  Past Medical History:  Diagnosis Date  . Anxiety   . Asthma    bronchial  . Breast cancer ([place])    "both sides" (10/29/2012)  . COPD (chronic obstructive pulmonary disease) ([place])   . Depression   . Diabetes mellitus   . Diverticulosis   . Fatty liver    hx: of  . Fibromyalgia   . Fibrous breast lumps   . GERD (gastroesophageal reflux disease)   . H/O hiatal hernia    Archie Endo 09/12/2007  (10/29/2012)  . Herpes   . Hypertension   . Left carotid stenosis    Hx: of  . Migraines    Archie Endo 09/01/2001 (10/29/2012)  . Osteoarthritis    Archie Endo 09/12/2007  (10/29/2012)  . Ovarian cyst   . Peripheral neuropathy ([place])   . Urinary incontinence   . Wears glasses     Past Surgical History:  Procedure Laterality Date  . ABDOMINAL HYSTERECTOMY    . ANTERIOR CERVICAL DECOMP/DISCECTOMY FUSION  09/2003   Archie Endo 09/25/2003 (10/29/2012)  . APPENDECTOMY    . BACK SURGERY     central decompression L5-S1/notes 10/23/2005 (10/29/2012)  . BREAST DUCTAL SYSTEM EXCISION Left    Archie Endo 04/02/2012  (10/29/2012)  . BREAST LUMPECTOMY Right 04/21/2012   Procedure: Right breast LUMPECTOMY with re-excision of  margins;  Surgeon: Adin Hector, MD;  Location: [facility];  Service: General;  Laterality: Right;  . BREAST LUMPECTOMY WITH NEEDLE LOCALIZATION Right 04/02/2012   Procedure: Excision Ductal System Right Breast with Needle Localization;  Surgeon: Adin Hector, MD;  Location: [facility];  Service: General;  Laterality: Right;  needle localization at 7:30 at [facility]   . CARPAL TUNNEL RELEASE Bilateral   . CESAREAN SECTION    . MYOMECTOMY    . NEVUS EXCISION Left 04/02/2012   Procedure: Excision Nevus Left Abdominal Wall;  Surgeon: Adin Hector, MD;  Location: [facility];  Service: General;  Laterality: Left;  . TONSILLECTOMY      Medications Prior to Admission  Medication Sig Dispense Refill  . acetaminophen (TYLENOL) 325 MG tablet Take 650 mg by mouth every 4 (four) hours as needed for moderate pain.    Marland Kitchen ALPRAZolam (XANAX) 0.5 MG tablet Take 0.5 mg by mouth at bedtime as needed for anxiety.    Marland Kitchen anastrozole (ARIMIDEX) 1 MG tablet Take 1 mg by mouth daily.    Marland Kitchen atropine 1 % ophthalmic solution Place 1 drop into both eyes daily    . calcium carbonate (OS-CAL - DOSED IN MG OF ELEMENTAL CALCIUM) 1250 (500 Ca) MG tablet Take 1 tablet by mouth 3 (three) times daily with meals.     . carbidopa-levodopa (SINEMET) 25-100 MG tablet Take 1/2 tab twice  daily x 3 days, then 1/2 tab three times daily x 4 days, then 1 tab three times daily (Patient taking differently: Take 1 tablet by mouth 3 (three) times daily. ) 90 tablet 0  . Cholecalciferol (VITAMIN D3) 1000 UNITS CAPS Take 2,000 Units by mouth daily.    . clopidogrel (PLAVIX) 75 MG tablet Take 75 mg by mouth daily.     . Cranberry 450 MG TABS Take 450 mg by mouth 2 (two) times daily.     . diclofenac sodium (VOLTAREN) 1 % GEL Apply 4 g topically 3 (three) times daily as needed (joint pain).    Marland Kitchen docusate sodium 100 MG CAPS Take 100 mg by mouth 2 (two) times daily. 10 capsule 0  . DULoxetine (CYMBALTA) 60 MG capsule Take 60  mg by mouth 2 (two) times daily.    . fluticasone (FLONASE) 50 MCG/ACT nasal spray Place 2 sprays into both nostrils daily as needed for rhinitis or allergies.  2  . furosemide (LASIX) 20 MG tablet Take 20 mg by mouth daily.     Marland Kitchen gabapentin (NEURONTIN) 600 MG tablet Take 600 mg by mouth 2 (two) times daily.     . insulin detemir (LEVEMIR) 100 unit/ml SOLN Inject 50 Units into the skin at bedtime.    . insulin lispro (HUMALOG) 100 UNIT/ML injection Inject 60-80 Units into the skin 3 (three) times daily with meals. Inject 80 units at 8AM and noon and inject 60 units at supper time    . ipratropium-albuterol (DUONEB) 0.5-2.5 (3) MG/3ML SOLN Take 3 mLs by nebulization every 6 (six) hours as needed (shortness of breath). 360 mL 2  . lisinopril (PRINIVIL,ZESTRIL) 5 MG tablet Take 5 mg by mouth 2 (two) times daily.     Marland Kitchen loratadine (CLARITIN) 10 MG tablet Take 10 mg by mouth daily.    . Melatonin 3 MG TABS Take 3 mg by mouth at bedtime.    . Menthol, Topical Analgesic, (BIOFREEZE) 4 % GEL Apply 1 application topically 2 (two) times daily.    . metoprolol succinate (TOPROL-XL) 50 MG 24 hr tablet     . ondansetron (ZOFRAN) 4 MG tablet Take 4 mg by mouth every 8 (eight) hours as needed for nausea or vomiting.    . phenol (SORE THROAT SPRAY) 1.4 % LIQD Use as directed 1 spray in the mouth or throat every 4 (four) hours as needed for throat irritation / pain.    . polyethylene glycol powder (GLYCOLAX/MIRALAX) powder Take 17 g by mouth daily.     . potassium chloride (K-DUR,KLOR-CON) 10 MEQ tablet Take 10 mEq by mouth daily.     . pravastatin (PRAVACHOL) 20 MG tablet Take 20 mg by mouth daily.     . prednisoLONE acetate (PRED FORTE) 1 % ophthalmic suspension Place 1 drop into the left eye daily.     . QUEtiapine (SEROQUEL) 50 MG tablet Take 50 mg by mouth at bedtime.     . rivastigmine (EXELON) 1.5 MG capsule Take 1.5 mg by mouth 2 (two) times daily.     Orlie Dakin Sodium (SENNA S PO) Take 1  tablet by mouth 2 (two) times daily.     Marland Kitchen tiZANidine (ZANAFLEX) 2 MG tablet Take 2 mg by mouth 2 (two) times daily.     Marland Kitchen tiZANidine (ZANAFLEX) 2 MG tablet Take 2 mg by mouth every 8 (eight) hours as needed for muscle spasms.    . traMADol (ULTRAM) 50 MG tablet Take 25 mg by mouth every  6 (six) hours as needed for moderate pain.     . bisacodyl (DULCOLAX) 10 MG suppository Place 1 suppository (10 mg total) rectally daily as needed for moderate constipation. (Patient not taking: Reported on 01/21/2016) 12 suppository 0  . cefUROXime (CEFTIN) 500 MG tablet Take 1 tablet (500 mg total) by mouth 2 (two) times daily with a meal. (Patient not taking: Reported on 01/21/2016) 10 tablet 0  . HYDROcodone-acetaminophen (NORCO/VICODIN) 5-325 MG per tablet Take 2 tablets by mouth every 6 (six) hours as needed. (Patient not taking: Reported on 01/21/2016) 10 tablet 0  . hydrOXYzine (ATARAX/VISTARIL) 10 MG tablet Take 1 tablet (10 mg total) by mouth every 8 (eight) hours as needed for itching. (Patient not taking: Reported on 01/21/2016) 30 tablet 0  . metroNIDAZOLE (FLAGYL) 500 MG tablet Take 1 tablet (500 mg total) by mouth 3 (three) times daily. (Patient not taking: Reported on 01/21/2016) 15 tablet 0  . QUEtiapine (SEROQUEL) 25 MG tablet Take 1 tablet (25 mg total) by mouth at bedtime. (Patient not taking: Reported on 01/21/2016) 30 tablet 0  . rivastigmine (EXELON) 3 MG capsule Take 1 tablet twice daily with food. (Patient not taking: Reported on 01/21/2016) 60 capsule 2  . rivastigmine (EXELON) 4.6 mg/24hr Place 1 patch (4.6 mg total) onto the skin daily. (Patient not taking: Reported on 01/21/2016) 30 patch 0  . temazepam (RESTORIL) 15 MG capsule Take 1 capsule (15 mg total) by mouth at bedtime as needed (for insomnia). (Patient not taking: Reported on 01/21/2016) 30 capsule 0    Allergies:  Allergies  Allergen Reactions  . Sertraline Hcl Hives  . Codeine Hives and Swelling  . Cyclobenzaprine Swelling and Hives   . Cyclobenzaprine Hcl Hives and Swelling  . Fentanyl     REACTION: PANIC ATTACKS  . Furosemide     REACTION: face was swollen and hand  . Gadolinium Derivatives Swelling  . Hydrocodone-Acetaminophen   . Iohexol      Code: SOB, Desc: xray dye, Onset Date: 16109604   . Levofloxacin     REACTION: PANIC ATTACK,DIARRHEA,EYES DISCOLORD  . Lorazepam Nausea And Vomiting  . Lubiprostone     REACTION: REALLY BAD NAUSEA  . Metformin Hives and Swelling  . Metoclopramide Nausea And Vomiting and Swelling  . Morphine     REACTION: itching, panic attacks  . Oxycodone Hives and Swelling  . Promethazine Hcl     REACTION: TRAMA ANDJITTERS IN MY BODY  . Rosiglitazone Hives and Swelling  . Sertraline Hcl Hives and Swelling  . Verapamil Hives and Nausea And Vomiting    Family History  Problem Relation Age of Onset  . Heart disease Mother   . Heart disease Brother   . Diabetes Brother   . Arthritis Sister   . Stroke Sister   . Cirrhosis Brother   . Cancer Brother     Bladder cancer    Social History:  reports that she quit smoking about 19 years ago. Her smoking use included Cigarettes. She quit after 20.00 years of use. She has never used smokeless tobacco. She reports that she does not drink alcohol or use drugs.  Review of Systems: negative except As above    Blood pressure (!) 108/48, pulse 62, temperature 99.1 F (37.3 C), temperature source Oral, resp. rate 18, height _0  (1.626 m), weight 89.4 kg (197 lb), SpO2 98 %. Head: Normocephalic, without obvious abnormality, atraumatic Neck: no adenopathy, no carotid bruit, no JVD, supple, symmetrical, trachea midline and thyroid not enlarged,  symmetric, no tenderness/mass/nodules Resp: clear to auscultation bilaterally Cardio: regular rate and rhythm, S1, S2 normal, no murmur, click, rub or gallop GI: abdomen soft slightly distended with normoactive bowel sounds, no hepatosplenomegaly mass guarding or tenderness  Extremities:  extremities normal, atraumatic, no cyanosis or edema  Results for orders placed or performed during the hospital encounter of 01/21/16 (from the past 48 hour(s))  ABO/Rh     Status: None   Collection Time: 01/21/16  7:24 AM  Result Value Ref Range   ABO/RH(D) B POS   Comprehensive metabolic panel     Status: Abnormal   Collection Time: 01/21/16  8:10 AM  Result Value Ref Range   Sodium 141 135 - 145 mmol/L   Potassium 4.4 3.5 - 5.1 mmol/L   Chloride 102 101 - 111 mmol/L   CO2 30 22 - 32 mmol/L   Glucose, Bld 281 (H) 65 - 99 mg/dL   BUN 43 (H) 6 - 20 mg/dL   Creatinine, Ser 1.36 (H) 0.44 - 1.00 mg/dL   Calcium 8.9 8.9 - 10.3 mg/dL   Total Protein 6.7 6.5 - 8.1 g/dL   Albumin 3.6 3.5 - 5.0 g/dL   AST 18 15 - 41 U/L   ALT 16 14 - 54 U/L   Alkaline Phosphatase 109 38 - 126 U/L   Total Bilirubin 0.8 0.3 - 1.2 mg/dL   GFR calc non Af Amer 37 (L) >60 mL/min   GFR calc Af Amer 43 (L) >60 mL/min    Comment: (NOTE) The eGFR has been calculated using the CKD EPI equation. This calculation has not been validated in all clinical situations. eGFR's persistently <60 mL/min signify possible Chronic Kidney Disease.    Anion gap 9 5 - 15  CBC     Status: None   Collection Time: 01/21/16  8:10 AM  Result Value Ref Range   WBC 9.3 4.0 - 10.5 K/uL   RBC 4.12 3.87 - 5.11 MIL/uL   Hemoglobin 12.9 12.0 - 15.0 g/dL   HCT 38.2 36.0 - 46.0 %   MCV 92.7 78.0 - 100.0 fL   MCH 31.3 26.0 - 34.0 pg   MCHC 33.8 30.0 - 36.0 g/dL   RDW 14.1 11.5 - 15.5 %   Platelets 162 150 - 400 K/uL  Type and screen [place]     Status: None   Collection Time: 01/21/16  8:10 AM  Result Value Ref Range   ABO/RH(D) B POS    Antibody Screen NEG    Sample Expiration 01/24/2016   Lipase, blood     Status: None   Collection Time: 01/21/16  8:30 AM  Result Value Ref Range   Lipase 46 11 - 51 U/L  POC occult blood, ED     Status: None   Collection Time: 01/21/16  8:52 AM  Result Value Ref Range    Fecal Occult Bld NEGATIVE NEGATIVE  I-Stat CG4 Lactic Acid, ED     Status: None   Collection Time: 01/21/16  9:01 AM  Result Value Ref Range   Lactic Acid, Venous 1.59 0.5 - 1.9 mmol/L  Urinalysis, Routine w reflex microscopic     Status: Abnormal   Collection Time: 01/21/16 12:10 PM  Result Value Ref Range   Color, Urine YELLOW YELLOW   APPearance CLOUDY (A) CLEAR   Specific Gravity, Urine 1.019 1.005 - 1.030   pH 5.0 5.0 - 8.0   Glucose, UA 150 (A) NEGATIVE mg/dL   Hgb urine dipstick MODERATE (  A) NEGATIVE   Bilirubin Urine NEGATIVE NEGATIVE   Ketones, ur NEGATIVE NEGATIVE mg/dL   Protein, ur >=300 (A) NEGATIVE mg/dL   Nitrite POSITIVE (A) NEGATIVE   Leukocytes, UA TRACE (A) NEGATIVE   RBC / HPF TOO NUMEROUS TO COUNT 0 - 5 RBC/hpf   WBC, UA 6-30 0 - 5 WBC/hpf   Bacteria, UA MANY (A) NONE SEEN   Squamous Epithelial / LPF NONE SEEN NONE SEEN   WBC Clumps PRESENT    Mucous PRESENT    Budding Yeast PRESENT   I-Stat CG4 Lactic Acid, ED     Status: None   Collection Time: 01/21/16 12:27 PM  Result Value Ref Range   Lactic Acid, Venous 1.58 0.5 - 1.9 mmol/L  POC CBG, ED     Status: Abnormal   Collection Time: 01/21/16  2:11 PM  Result Value Ref Range   Glucose-Capillary 204 (H) 65 - 99 mg/dL   Comment 1 Notify RN    Comment 2 Document in Chart   Glucose, capillary     Status: Abnormal   Collection Time: 01/21/16  4:33 PM  Result Value Ref Range   Glucose-Capillary 162 (H) 65 - 99 mg/dL  Hemoglobin and hematocrit, blood     Status: Abnormal   Collection Time: 01/21/16  5:01 PM  Result Value Ref Range   Hemoglobin 11.6 (L) 12.0 - 15.0 g/dL   HCT 34.7 (L) 36.0 - 46.0 %  MRSA PCR Screening     Status: None   Collection Time: 01/21/16  5:09 PM  Result Value Ref Range   MRSA by PCR NEGATIVE NEGATIVE    Comment:        The GeneXpert MRSA Assay (FDA approved for NASAL specimens only), is one component of a comprehensive MRSA colonization surveillance program. It is  not intended to diagnose MRSA infection nor to guide or monitor treatment for MRSA infections.   Glucose, capillary     Status: Abnormal   Collection Time: 01/21/16  8:51 PM  Result Value Ref Range   Glucose-Capillary 278 (H) 65 - 99 mg/dL  Hemoglobin and hematocrit, blood     Status: Abnormal   Collection Time: 01/21/16 10:42 PM  Result Value Ref Range   Hemoglobin 10.0 (L) 12.0 - 15.0 g/dL   HCT 29.3 (L) 36.0 - 46.0 %  Hemoglobin and hematocrit, blood     Status: Abnormal   Collection Time: 01/22/16  5:36 AM  Result Value Ref Range   Hemoglobin 10.2 (L) 12.0 - 15.0 g/dL   HCT 30.6 (L) 36.0 - 46.0 %  CBC     Status: Abnormal   Collection Time: 01/22/16  5:36 AM  Result Value Ref Range   WBC 4.7 4.0 - 10.5 K/uL   RBC 3.28 (L) 3.87 - 5.11 MIL/uL   Hemoglobin 10.2 (L) 12.0 - 15.0 g/dL   HCT 30.8 (L) 36.0 - 46.0 %   MCV 93.9 78.0 - 100.0 fL   MCH 31.1 26.0 - 34.0 pg   MCHC 33.1 30.0 - 36.0 g/dL   RDW 14.7 11.5 - 15.5 %   Platelets 128 (L) 150 - 400 K/uL  Comprehensive metabolic panel     Status: Abnormal   Collection Time: 01/22/16  5:36 AM  Result Value Ref Range   Sodium 139 135 - 145 mmol/L   Potassium 4.1 3.5 - 5.1 mmol/L   Chloride 102 101 - 111 mmol/L   CO2 31 22 - 32 mmol/L   Glucose, Bld 150 (H) 65 -  99 mg/dL   BUN 39 (H) 6 - 20 mg/dL   Creatinine, Ser 1.47 (H) 0.44 - 1.00 mg/dL   Calcium 8.3 (L) 8.9 - 10.3 mg/dL   Total Protein 5.4 (L) 6.5 - 8.1 g/dL   Albumin 3.4 (L) 3.5 - 5.0 g/dL   AST 15 15 - 41 U/L   ALT 7 (L) 14 - 54 U/L   Alkaline Phosphatase 86 38 - 126 U/L   Total Bilirubin 0.6 0.3 - 1.2 mg/dL   GFR calc non Af Amer 34 (L) >60 mL/min   GFR calc Af Amer 39 (L) >60 mL/min    Comment: (NOTE) The eGFR has been calculated using the CKD EPI equation. This calculation has not been validated in all clinical situations. eGFR's persistently <60 mL/min signify possible Chronic Kidney Disease.    Anion gap 6 5 - 15   Dg Chest 2 View  Result Date:  01/21/2016 CLINICAL DATA:  Hematemesis EXAM: CHEST  2 VIEW COMPARISON:  02/04/2013 FINDINGS: There is mild bilateral interstitial thickening. There is no focal consolidation. There is no pleural effusion or pneumothorax. There is mild stable cardiomegaly. The osseous structures are unremarkable. IMPRESSION: 1. Cardiomegaly with mild pulmonary vascular congestion. Electronically Signed   By: Kathreen Devoid   On: 01/21/2016 11:47    Assessment: Emesis described as coffee-ground appearance overall suggesting low-grade non-destabilizing upper GI bleed. Plan:  IV Protonix and will allow full liquid diet today. Monitor stools and hemoglobin and plan EGD in the morning. Erica Osuna C 01/22/2016, 9:42 AM  Pager [phone] If no answer or after 5 PM call [phone]

## 2016-01-23 ENCOUNTER — Observation Stay (HOSPITAL_COMMUNITY): Payer: Medicare Other | Admitting: Anesthesiology

## 2016-01-23 ENCOUNTER — Encounter (HOSPITAL_COMMUNITY)
Admission: EM | Disposition: A | Discharge status: Skilled Nursing Facility | Payer: Self-pay | Source: Home / Self Care | Attending: Internal Medicine

## 2016-01-23 ENCOUNTER — Encounter (HOSPITAL_COMMUNITY): Payer: Self-pay | Admitting: *Deleted

## 2016-01-23 DIAGNOSIS — K2211 Ulcer of esophagus with bleeding: Secondary | ICD-10-CM | POA: Diagnosis not present

## 2016-01-23 DIAGNOSIS — K2210 Ulcer of esophagus without bleeding: Secondary | ICD-10-CM

## 2016-01-23 DIAGNOSIS — N3001 Acute cystitis with hematuria: Secondary | ICD-10-CM | POA: Diagnosis not present

## 2016-01-23 DIAGNOSIS — K920 Hematemesis: Secondary | ICD-10-CM | POA: Diagnosis not present

## 2016-01-23 DIAGNOSIS — K922 Gastrointestinal hemorrhage, unspecified: Secondary | ICD-10-CM | POA: Diagnosis not present

## 2016-01-23 HISTORY — PX: ESOPHAGOGASTRODUODENOSCOPY: SHX5428

## 2016-01-23 LAB — CBC WITH DIFFERENTIAL/PLATELET
BASOS ABS: 0 10*3/uL (ref 0.0–0.1)
BASOS PCT: 0 %
EOS PCT: 7 %
Eosinophils Absolute: 0.3 10*3/uL (ref 0.0–0.7)
HEMATOCRIT: 29.2 % — AB (ref 36.0–46.0)
Hemoglobin: 9.8 g/dL — ABNORMAL LOW (ref 12.0–15.0)
LYMPHS PCT: 39 %
Lymphs Abs: 1.5 10*3/uL (ref 0.7–4.0)
MCH: 30.3 pg (ref 26.0–34.0)
MCHC: 33.6 g/dL (ref 30.0–36.0)
MCV: 90.4 fL (ref 78.0–100.0)
MONO ABS: 0.3 10*3/uL (ref 0.1–1.0)
MONOS PCT: 9 %
Neutro Abs: 1.7 10*3/uL (ref 1.7–7.7)
Neutrophils Relative %: 45 %
Platelets: 128 10*3/uL — ABNORMAL LOW (ref 150–400)
RBC: 3.23 MIL/uL — ABNORMAL LOW (ref 3.87–5.11)
RDW: 14.1 % (ref 11.5–15.5)
WBC: 3.8 10*3/uL — ABNORMAL LOW (ref 4.0–10.5)

## 2016-01-23 LAB — GLUCOSE, CAPILLARY
GLUCOSE-CAPILLARY: 130 mg/dL — AB (ref 65–99)
GLUCOSE-CAPILLARY: 82 mg/dL (ref 65–99)
GLUCOSE-CAPILLARY: 201 mg/dL — AB (ref 65–99)
Glucose-Capillary: 188 mg/dL — ABNORMAL HIGH (ref 65–99)

## 2016-01-23 LAB — COMPREHENSIVE METABOLIC PANEL
ALT: 5 U/L — ABNORMAL LOW (ref 14–54)
ANION GAP: 6 (ref 5–15)
AST: 14 U/L — AB (ref 15–41)
Albumin: 2.8 g/dL — ABNORMAL LOW (ref 3.5–5.0)
Alkaline Phosphatase: 83 U/L (ref 38–126)
BILIRUBIN TOTAL: 0.8 mg/dL (ref 0.3–1.2)
BUN: 31 mg/dL — AB (ref 6–20)
CHLORIDE: 104 mmol/L (ref 101–111)
CO2: 27 mmol/L (ref 22–32)
Calcium: 8.3 mg/dL — ABNORMAL LOW (ref 8.9–10.3)
Creatinine, Ser: 1.13 mg/dL — ABNORMAL HIGH (ref 0.44–1.00)
GFR calc Af Amer: 54 mL/min — ABNORMAL LOW (ref >60)
GFR, EST NON AFRICAN AMERICAN: 47 mL/min — AB (ref >60)
Glucose, Bld: 197 mg/dL — ABNORMAL HIGH (ref 65–99)
POTASSIUM: 4 mmol/L (ref 3.5–5.1)
Sodium: 137 mmol/L (ref 135–145)
TOTAL PROTEIN: 5.5 g/dL — AB (ref 6.5–8.1)

## 2016-01-23 LAB — URINE CULTURE: CULTURE: NO GROWTH

## 2016-01-23 LAB — MAGNESIUM: MAGNESIUM: 2.2 mg/dL (ref 1.7–2.4)

## 2016-01-23 LAB — PHOSPHORUS: PHOSPHORUS: 3 mg/dL (ref 2.5–4.6)

## 2016-01-23 SURGERY — EGD (ESOPHAGOGASTRODUODENOSCOPY)
Anesthesia: Monitor Anesthesia Care

## 2016-01-23 MED ORDER — SODIUM CHLORIDE 0.9 % IV SOLN
INTRAVENOUS | Status: DC
  Administered 2016-01-23 (×2): via INTRAVENOUS

## 2016-01-23 MED ORDER — PROPOFOL 500 MG/50ML IV EMUL
INTRAVENOUS | Status: DC | PRN
  Administered 2016-01-23: 75 ug/kg/min via INTRAVENOUS

## 2016-01-23 MED ORDER — PROPOFOL 10 MG/ML IV BOLUS
INTRAVENOUS | Status: AC
Start: 2016-01-23 — End: 2016-01-23
  Filled 2016-01-23: qty 20

## 2016-01-23 NOTE — Transfer of Care (Signed)
Immediate Anesthesia Transfer of Care Note  Patient: Katherine Carr  Procedure(s) Performed: Procedure(s): ESOPHAGOGASTRODUODENOSCOPY (EGD) (N/A)  Patient Location: PACU  Anesthesia Type:MAC  Level of Consciousness: sedated  Airway & Oxygen Therapy: Patient Spontanous Breathing and Patient connected to nasal cannula oxygen  Post-op Assessment: Report given to RN and Post -op Vital signs reviewed and stable  Post vital signs: Reviewed and stable  Last Vitals:  Vitals:   01/23/16 0438 01/23/16 1040  BP: (!) 145/53 (!) 163/57  Pulse: (!) 51 (!) 54  Resp: 20 16  Temp: 36.4 C 36.5 C    Last Pain:  Vitals:   01/23/16 0438  TempSrc: Oral  PainSc:       Patients Stated Pain Goal: 3 (69/67/89 3810)  Complications: No apparent anesthesia complications

## 2016-01-23 NOTE — H&P (View-Only) (Signed)
[facility] Consult Note  Referring Provider: No ref. provider found Primary Care Physician:  HUSAIN,KARRAR, MD Primary Gastroenterologist:  Dr.  Chief Complaint: Coffee-ground emesis HPI: Katherine Carr is an 76 y.o. white female  nursing home resident transferred due to staff witnessing to 3 episodes of emesis described as coffee ground in appearance which was gastric occult positive. She had initial hemoglobin of 12.9 slightly elevated BUNs and creatinine and heme-negative stool in the emergency room. She has a history of gastroesophageal reflux and hiatal hernia. She was complaining of some abdominal pain today but states the both the pain and nausea are better today. She is not had anymore emesis or stool since admission and states she is hungry. She is not on any blood thinners.  Past Medical History:  Diagnosis Date  . Anxiety   . Asthma    bronchial  . Breast cancer (HCC)    "both sides" (10/29/2012)  . COPD (chronic obstructive pulmonary disease) (HCC)   . Depression   . Diabetes mellitus   . Diverticulosis   . Fatty liver    hx: of  . Fibromyalgia   . Fibrous breast lumps   . GERD (gastroesophageal reflux disease)   . H/O hiatal hernia    /notes 09/12/2007  (10/29/2012)  . Herpes   . Hypertension   . Left carotid stenosis    Hx: of  . Migraines    /notes 09/01/2001 (10/29/2012)  . Osteoarthritis    /notes 09/12/2007  (10/29/2012)  . Ovarian cyst   . Peripheral neuropathy (HCC)   . Urinary incontinence   . Wears glasses     Past Surgical History:  Procedure Laterality Date  . ABDOMINAL HYSTERECTOMY    . ANTERIOR CERVICAL DECOMP/DISCECTOMY FUSION  09/2003   /notes 09/25/2003 (10/29/2012)  . APPENDECTOMY    . BACK SURGERY     central decompression L5-S1/notes 10/23/2005 (10/29/2012)  . BREAST DUCTAL SYSTEM EXCISION Left    /notes 04/02/2012  (10/29/2012)  . BREAST LUMPECTOMY Right 04/21/2012   Procedure: Right breast LUMPECTOMY with re-excision of  margins;  Surgeon: Haywood M Ingram, MD;  Location: [facility] OR;  Service: General;  Laterality: Right;  . BREAST LUMPECTOMY WITH NEEDLE LOCALIZATION Right 04/02/2012   Procedure: Excision Ductal System Right Breast with Needle Localization;  Surgeon: Haywood M Ingram, MD;  Location: [facility];  Service: General;  Laterality: Right;  needle localization at 7:30 at [facility]   . CARPAL TUNNEL RELEASE Bilateral   . CESAREAN SECTION    . MYOMECTOMY    . NEVUS EXCISION Left 04/02/2012   Procedure: Excision Nevus Left Abdominal Wall;  Surgeon: Haywood M Ingram, MD;  Location: [facility];  Service: General;  Laterality: Left;  . TONSILLECTOMY      Medications Prior to Admission  Medication Sig Dispense Refill  . acetaminophen (TYLENOL) 325 MG tablet Take 650 mg by mouth every 4 (four) hours as needed for moderate pain.    . ALPRAZolam (XANAX) 0.5 MG tablet Take 0.5 mg by mouth at bedtime as needed for anxiety.    . anastrozole (ARIMIDEX) 1 MG tablet Take 1 mg by mouth daily.    . atropine 1 % ophthalmic solution Place 1 drop into both eyes daily    . calcium carbonate (OS-CAL - DOSED IN MG OF ELEMENTAL CALCIUM) 1250 (500 Ca) MG tablet Take 1 tablet by mouth 3 (three) times daily with meals.     . carbidopa-levodopa (SINEMET) 25-100 MG tablet Take 1/2 tab twice   daily x 3 days, then 1/2 tab three times daily x 4 days, then 1 tab three times daily (Patient taking differently: Take 1 tablet by mouth 3 (three) times daily. ) 90 tablet 0  . Cholecalciferol (VITAMIN D3) 1000 UNITS CAPS Take 2,000 Units by mouth daily.    . clopidogrel (PLAVIX) 75 MG tablet Take 75 mg by mouth daily.     . Cranberry 450 MG TABS Take 450 mg by mouth 2 (two) times daily.     . diclofenac sodium (VOLTAREN) 1 % GEL Apply 4 g topically 3 (three) times daily as needed (joint pain).    . docusate sodium 100 MG CAPS Take 100 mg by mouth 2 (two) times daily. 10 capsule 0  . DULoxetine (CYMBALTA) 60 MG capsule Take 60  mg by mouth 2 (two) times daily.    . fluticasone (FLONASE) 50 MCG/ACT nasal spray Place 2 sprays into both nostrils daily as needed for rhinitis or allergies.  2  . furosemide (LASIX) 20 MG tablet Take 20 mg by mouth daily.     . gabapentin (NEURONTIN) 600 MG tablet Take 600 mg by mouth 2 (two) times daily.     . insulin detemir (LEVEMIR) 100 unit/ml SOLN Inject 50 Units into the skin at bedtime.    . insulin lispro (HUMALOG) 100 UNIT/ML injection Inject 60-80 Units into the skin 3 (three) times daily with meals. Inject 80 units at 8AM and noon and inject 60 units at supper time    . ipratropium-albuterol (DUONEB) 0.5-2.5 (3) MG/3ML SOLN Take 3 mLs by nebulization every 6 (six) hours as needed (shortness of breath). 360 mL 2  . lisinopril (PRINIVIL,ZESTRIL) 5 MG tablet Take 5 mg by mouth 2 (two) times daily.     . loratadine (CLARITIN) 10 MG tablet Take 10 mg by mouth daily.    . Melatonin 3 MG TABS Take 3 mg by mouth at bedtime.    . Menthol, Topical Analgesic, (BIOFREEZE) 4 % GEL Apply 1 application topically 2 (two) times daily.    . metoprolol succinate (TOPROL-XL) 50 MG 24 hr tablet     . ondansetron (ZOFRAN) 4 MG tablet Take 4 mg by mouth every 8 (eight) hours as needed for nausea or vomiting.    . phenol (SORE THROAT SPRAY) 1.4 % LIQD Use as directed 1 spray in the mouth or throat every 4 (four) hours as needed for throat irritation / pain.    . polyethylene glycol powder (GLYCOLAX/MIRALAX) powder Take 17 g by mouth daily.     . potassium chloride (K-DUR,KLOR-CON) 10 MEQ tablet Take 10 mEq by mouth daily.     . pravastatin (PRAVACHOL) 20 MG tablet Take 20 mg by mouth daily.     . prednisoLONE acetate (PRED FORTE) 1 % ophthalmic suspension Place 1 drop into the left eye daily.     . QUEtiapine (SEROQUEL) 50 MG tablet Take 50 mg by mouth at bedtime.     . rivastigmine (EXELON) 1.5 MG capsule Take 1.5 mg by mouth 2 (two) times daily.     . Sennosides-Docusate Sodium (SENNA S PO) Take 1  tablet by mouth 2 (two) times daily.     . tiZANidine (ZANAFLEX) 2 MG tablet Take 2 mg by mouth 2 (two) times daily.     . tiZANidine (ZANAFLEX) 2 MG tablet Take 2 mg by mouth every 8 (eight) hours as needed for muscle spasms.    . traMADol (ULTRAM) 50 MG tablet Take 25 mg by mouth every   6 (six) hours as needed for moderate pain.     . bisacodyl (DULCOLAX) 10 MG suppository Place 1 suppository (10 mg total) rectally daily as needed for moderate constipation. (Patient not taking: Reported on 01/21/2016) 12 suppository 0  . cefUROXime (CEFTIN) 500 MG tablet Take 1 tablet (500 mg total) by mouth 2 (two) times daily with a meal. (Patient not taking: Reported on 01/21/2016) 10 tablet 0  . HYDROcodone-acetaminophen (NORCO/VICODIN) 5-325 MG per tablet Take 2 tablets by mouth every 6 (six) hours as needed. (Patient not taking: Reported on 01/21/2016) 10 tablet 0  . hydrOXYzine (ATARAX/VISTARIL) 10 MG tablet Take 1 tablet (10 mg total) by mouth every 8 (eight) hours as needed for itching. (Patient not taking: Reported on 01/21/2016) 30 tablet 0  . metroNIDAZOLE (FLAGYL) 500 MG tablet Take 1 tablet (500 mg total) by mouth 3 (three) times daily. (Patient not taking: Reported on 01/21/2016) 15 tablet 0  . QUEtiapine (SEROQUEL) 25 MG tablet Take 1 tablet (25 mg total) by mouth at bedtime. (Patient not taking: Reported on 01/21/2016) 30 tablet 0  . rivastigmine (EXELON) 3 MG capsule Take 1 tablet twice daily with food. (Patient not taking: Reported on 01/21/2016) 60 capsule 2  . rivastigmine (EXELON) 4.6 mg/24hr Place 1 patch (4.6 mg total) onto the skin daily. (Patient not taking: Reported on 01/21/2016) 30 patch 0  . temazepam (RESTORIL) 15 MG capsule Take 1 capsule (15 mg total) by mouth at bedtime as needed (for insomnia). (Patient not taking: Reported on 01/21/2016) 30 capsule 0    Allergies:  Allergies  Allergen Reactions  . Sertraline Hcl Hives  . Codeine Hives and Swelling  . Cyclobenzaprine Swelling and Hives   . Cyclobenzaprine Hcl Hives and Swelling  . Fentanyl     REACTION: PANIC ATTACKS  . Furosemide     REACTION: face was swollen and hand  . Gadolinium Derivatives Swelling  . Hydrocodone-Acetaminophen   . Iohexol      Code: SOB, Desc: xray dye, Onset Date: [id number]   . Levofloxacin     REACTION: PANIC ATTACK,DIARRHEA,EYES DISCOLORD  . Lorazepam Nausea And Vomiting  . Lubiprostone     REACTION: REALLY BAD NAUSEA  . Metformin Hives and Swelling  . Metoclopramide Nausea And Vomiting and Swelling  . Morphine     REACTION: itching, panic attacks  . Oxycodone Hives and Swelling  . Promethazine Hcl     REACTION: TRAMA ANDJITTERS IN MY BODY  . Rosiglitazone Hives and Swelling  . Sertraline Hcl Hives and Swelling  . Verapamil Hives and Nausea And Vomiting    Family History  Problem Relation Age of Onset  . Heart disease Mother   . Heart disease Brother   . Diabetes Brother   . Arthritis Sister   . Stroke Sister   . Cirrhosis Brother   . Cancer Brother     Bladder cancer    Social History:  reports that she quit smoking about 19 years ago. Her smoking use included Cigarettes. She quit after 20.00 years of use. She has never used smokeless tobacco. She reports that she does not drink alcohol or use drugs.  Review of Systems: negative except As above    Blood pressure (!) 108/48, pulse 62, temperature 99.1 F (37.3 C), temperature source Oral, resp. rate 18, height 5' 4" (1.626 m), weight 89.4 kg (197 lb), SpO2 98 %. Head: Normocephalic, without obvious abnormality, atraumatic Neck: no adenopathy, no carotid bruit, no JVD, supple, symmetrical, trachea midline and thyroid not enlarged,   symmetric, no tenderness/mass/nodules Resp: clear to auscultation bilaterally Cardio: regular rate and rhythm, S1, S2 normal, no murmur, click, rub or gallop GI: abdomen soft slightly distended with normoactive bowel sounds, no hepatosplenomegaly mass guarding or tenderness  Extremities:  extremities normal, atraumatic, no cyanosis or edema  Results for orders placed or performed during the hospital encounter of 01/21/16 (from the past 48 hour(s))  ABO/Rh     Status: None   Collection Time: 01/21/16  7:24 AM  Result Value Ref Range   ABO/RH(D) B POS   Comprehensive metabolic panel     Status: Abnormal   Collection Time: 01/21/16  8:10 AM  Result Value Ref Range   Sodium 141 135 - 145 mmol/L   Potassium 4.4 3.5 - 5.1 mmol/L   Chloride 102 101 - 111 mmol/L   CO2 30 22 - 32 mmol/L   Glucose, Bld 281 (H) 65 - 99 mg/dL   BUN 43 (H) 6 - 20 mg/dL   Creatinine, Ser 1.36 (H) 0.44 - 1.00 mg/dL   Calcium 8.9 8.9 - 10.3 mg/dL   Total Protein 6.7 6.5 - 8.1 g/dL   Albumin 3.6 3.5 - 5.0 g/dL   AST 18 15 - 41 U/L   ALT 16 14 - 54 U/L   Alkaline Phosphatase 109 38 - 126 U/L   Total Bilirubin 0.8 0.3 - 1.2 mg/dL   GFR calc non Af Amer 37 (L) >60 mL/min   GFR calc Af Amer 43 (L) >60 mL/min    Comment: (NOTE) The eGFR has been calculated using the CKD EPI equation. This calculation has not been validated in all clinical situations. eGFR's persistently <60 mL/min signify possible Chronic Kidney Disease.    Anion gap 9 5 - 15  CBC     Status: None   Collection Time: 01/21/16  8:10 AM  Result Value Ref Range   WBC 9.3 4.0 - 10.5 K/uL   RBC 4.12 3.87 - 5.11 MIL/uL   Hemoglobin 12.9 12.0 - 15.0 g/dL   HCT 38.2 36.0 - 46.0 %   MCV 92.7 78.0 - 100.0 fL   MCH 31.3 26.0 - 34.0 pg   MCHC 33.8 30.0 - 36.0 g/dL   RDW 14.1 11.5 - 15.5 %   Platelets 162 150 - 400 K/uL  Type and screen [facility]     Status: None   Collection Time: 01/21/16  8:10 AM  Result Value Ref Range   ABO/RH(D) B POS    Antibody Screen NEG    Sample Expiration 01/24/2016   Lipase, blood     Status: None   Collection Time: 01/21/16  8:30 AM  Result Value Ref Range   Lipase 46 11 - 51 U/L  POC occult blood, ED     Status: None   Collection Time: 01/21/16  8:52 AM  Result Value Ref Range    Fecal Occult Bld NEGATIVE NEGATIVE  I-Stat CG4 Lactic Acid, ED     Status: None   Collection Time: 01/21/16  9:01 AM  Result Value Ref Range   Lactic Acid, Venous 1.59 0.5 - 1.9 mmol/L  Urinalysis, Routine w reflex microscopic     Status: Abnormal   Collection Time: 01/21/16 12:10 PM  Result Value Ref Range   Color, Urine YELLOW YELLOW   APPearance CLOUDY (A) CLEAR   Specific Gravity, Urine 1.019 1.005 - 1.030   pH 5.0 5.0 - 8.0   Glucose, UA 150 (A) NEGATIVE mg/dL   Hgb urine dipstick MODERATE (  A) NEGATIVE   Bilirubin Urine NEGATIVE NEGATIVE   Ketones, ur NEGATIVE NEGATIVE mg/dL   Protein, ur >=300 (A) NEGATIVE mg/dL   Nitrite POSITIVE (A) NEGATIVE   Leukocytes, UA TRACE (A) NEGATIVE   RBC / HPF TOO NUMEROUS TO COUNT 0 - 5 RBC/hpf   WBC, UA 6-30 0 - 5 WBC/hpf   Bacteria, UA MANY (A) NONE SEEN   Squamous Epithelial / LPF NONE SEEN NONE SEEN   WBC Clumps PRESENT    Mucous PRESENT    Budding Yeast PRESENT   I-Stat CG4 Lactic Acid, ED     Status: None   Collection Time: 01/21/16 12:27 PM  Result Value Ref Range   Lactic Acid, Venous 1.58 0.5 - 1.9 mmol/L  POC CBG, ED     Status: Abnormal   Collection Time: 01/21/16  2:11 PM  Result Value Ref Range   Glucose-Capillary 204 (H) 65 - 99 mg/dL   Comment 1 Notify RN    Comment 2 Document in Chart   Glucose, capillary     Status: Abnormal   Collection Time: 01/21/16  4:33 PM  Result Value Ref Range   Glucose-Capillary 162 (H) 65 - 99 mg/dL  Hemoglobin and hematocrit, blood     Status: Abnormal   Collection Time: 01/21/16  5:01 PM  Result Value Ref Range   Hemoglobin 11.6 (L) 12.0 - 15.0 g/dL   HCT 34.7 (L) 36.0 - 46.0 %  MRSA PCR Screening     Status: None   Collection Time: 01/21/16  5:09 PM  Result Value Ref Range   MRSA by PCR NEGATIVE NEGATIVE    Comment:        The GeneXpert MRSA Assay (FDA approved for NASAL specimens only), is one component of a comprehensive MRSA colonization surveillance program. It is  not intended to diagnose MRSA infection nor to guide or monitor treatment for MRSA infections.   Glucose, capillary     Status: Abnormal   Collection Time: 01/21/16  8:51 PM  Result Value Ref Range   Glucose-Capillary 278 (H) 65 - 99 mg/dL  Hemoglobin and hematocrit, blood     Status: Abnormal   Collection Time: 01/21/16 10:42 PM  Result Value Ref Range   Hemoglobin 10.0 (L) 12.0 - 15.0 g/dL   HCT 29.3 (L) 36.0 - 46.0 %  Hemoglobin and hematocrit, blood     Status: Abnormal   Collection Time: 01/22/16  5:36 AM  Result Value Ref Range   Hemoglobin 10.2 (L) 12.0 - 15.0 g/dL   HCT 30.6 (L) 36.0 - 46.0 %  CBC     Status: Abnormal   Collection Time: 01/22/16  5:36 AM  Result Value Ref Range   WBC 4.7 4.0 - 10.5 K/uL   RBC 3.28 (L) 3.87 - 5.11 MIL/uL   Hemoglobin 10.2 (L) 12.0 - 15.0 g/dL   HCT 30.8 (L) 36.0 - 46.0 %   MCV 93.9 78.0 - 100.0 fL   MCH 31.1 26.0 - 34.0 pg   MCHC 33.1 30.0 - 36.0 g/dL   RDW 14.7 11.5 - 15.5 %   Platelets 128 (L) 150 - 400 K/uL  Comprehensive metabolic panel     Status: Abnormal   Collection Time: 01/22/16  5:36 AM  Result Value Ref Range   Sodium 139 135 - 145 mmol/L   Potassium 4.1 3.5 - 5.1 mmol/L   Chloride 102 101 - 111 mmol/L   CO2 31 22 - 32 mmol/L   Glucose, Bld 150 (H) 65 -   99 mg/dL   BUN 39 (H) 6 - 20 mg/dL   Creatinine, Ser 1.47 (H) 0.44 - 1.00 mg/dL   Calcium 8.3 (L) 8.9 - 10.3 mg/dL   Total Protein 5.4 (L) 6.5 - 8.1 g/dL   Albumin 3.4 (L) 3.5 - 5.0 g/dL   AST 15 15 - 41 U/L   ALT 7 (L) 14 - 54 U/L   Alkaline Phosphatase 86 38 - 126 U/L   Total Bilirubin 0.6 0.3 - 1.2 mg/dL   GFR calc non Af Amer 34 (L) >60 mL/min   GFR calc Af Amer 39 (L) >60 mL/min    Comment: (NOTE) The eGFR has been calculated using the CKD EPI equation. This calculation has not been validated in all clinical situations. eGFR's persistently <60 mL/min signify possible Chronic Kidney Disease.    Anion gap 6 5 - 15   Dg Chest 2 View  Result Date:  01/21/2016 CLINICAL DATA:  Hematemesis EXAM: CHEST  2 VIEW COMPARISON:  02/04/2013 FINDINGS: There is mild bilateral interstitial thickening. There is no focal consolidation. There is no pleural effusion or pneumothorax. There is mild stable cardiomegaly. The osseous structures are unremarkable. IMPRESSION: 1. Cardiomegaly with mild pulmonary vascular congestion. Electronically Signed   By: Hetal  Patel   On: 01/21/2016 11:47    Assessment: Emesis described as coffee-ground appearance overall suggesting low-grade non-destabilizing upper GI bleed. Plan:  IV Protonix and will allow full liquid diet today. Monitor stools and hemoglobin and plan EGD in the morning. Madisan Bice C 01/22/2016, 9:42 AM  Pager [phone] If no answer or after 5 PM call [phone]  

## 2016-01-23 NOTE — Op Note (Signed)
[facility] Patient Name: Katherine Carr Procedure Date: 01/23/2016 MRN: [id number] Attending MD: Arta Silence , MD Date of Birth: [birth year] CSN: [id number] Age: 76 Admit Type: Inpatient Procedure:                Upper GI endoscopy Indications:              Hematemesis Providers:                Arta Silence, MD, Elmer Ramp. Tilden Dome, RN, Elspeth Cho Tech., Technician, Delena Bali, CRNA Referring MD:              Medicines:                Monitored Anesthesia Care Complications:            No immediate complications. Estimated Blood Loss:     Estimated blood loss: none. Procedure:                Pre-Anesthesia Assessment:                           - Prior to the procedure, a History and Physical                            was performed, and patient medications and                            allergies were reviewed. The patient's tolerance of                            previous anesthesia was also reviewed. The risks                            and benefits of the procedure and the sedation                            options and risks were discussed with the patient.                            All questions were answered, and informed consent                            was obtained. Prior Anticoagulants: The patient has                            taken Plavix (clopidogrel), last dose was 3 days                            prior to procedure. ASA Grade Assessment: III - A                            patient with severe systemic disease. After  reviewing the risks and benefits, the patient was                            deemed in satisfactory condition to undergo the                            procedure.                           After obtaining informed consent, the endoscope was                            passed under direct vision. Throughout the                            procedure, the patient's blood  pressure, pulse, and                            oxygen saturations were monitored continuously. The                            [id number] scope was introduced through the                            mouth, and advanced to the second part of duodenum.                            The upper GI endoscopy was accomplished without                            difficulty. The patient tolerated the procedure                            well. Scope In: Scope Out: Findings:      A small hiatal hernia was present.      Few cratered esophageal ulcers with no bleeding were found.      LA Grade D (one or more mucosal breaks involving at least 75% of       esophageal circumference) esophagitis was found.      The exam of the esophagus was otherwise normal.      A few diminutive sessile polyps were found in the gastric fundus and in       the prepyloric region of the stomach. Given patient's age,       comoribidities, poor treatment candidate, and highly likely benign       nature of these polyps, biopsies were not obtained.      The exam of the stomach was otherwise normal.      Patchy mild inflammation was found in the duodenal bulb.      The exam of the duodenum was otherwise normal. No old or fresh blood was       seen to the extent of our examination. Impression:               - Small hiatal hernia.                           -  Non-bleeding esophageal ulcers.                           - LA Grade D reflux esophagitis.                           - A few gastric polyps.                           - Duodenitis.                           - Suspect coffee ground emesis from severe erosive                            esophagitis, highly like reflux-mediated and                            exacerbated by prior use of clopidigrel. Moderate Sedation:      None Recommendation:           - Return patient to hospital ward for ongoing care.                           - Full liquid diet.                            - Continue present medications, including PPI which                            patient will require indefinitely.                           - Hold off on clopidigrel at least for the next                            couple weeks, unless using this medication is                            absolutely essential during this time course.                           [facility] will follow. Procedure Code(s):        --- Professional ---                           [phone], Esophagogastroduodenoscopy, flexible,                            transoral; diagnostic, including collection of                            specimen(s) by brushing or washing, when performed                            (separate procedure) Diagnosis Code(s):        --- Professional ---  K44.9, Diaphragmatic hernia without obstruction or                            gangrene                           K22.10, Ulcer of esophagus without bleeding                           K21.0, Gastro-esophageal reflux disease with                            esophagitis                           K31.7, Polyp of stomach and duodenum                           K29.80, Duodenitis without bleeding                           K92.0, Hematemesis CPT copyright 2016 [employer]. All rights reserved. The codes documented in this report are preliminary and upon coder review may  be revised to meet current compliance requirements. Arta Silence, MD 01/23/2016 11:42:43 AM This report has been signed electronically. Number of Addenda: 0

## 2016-01-23 NOTE — Anesthesia Preprocedure Evaluation (Addendum)
Anesthesia Evaluation  Patient identified by MRN, date of birth, ID band Patient awake    Reviewed: Allergy & Precautions, NPO status , Patient's Chart, lab work & pertinent test results  History of Anesthesia Complications Negative for: history of anesthetic complications  Airway Mallampati: II  TM Distance: >3 FB Neck ROM: Full    Dental  (+) Poor Dentition, Dental Advisory Given   Pulmonary asthma , COPD, former smoker,    Pulmonary exam normal        Cardiovascular hypertension, + Peripheral Vascular Disease  Normal cardiovascular exam     Neuro/Psych  Headaches, PSYCHIATRIC DISORDERS Anxiety Depression    GI/Hepatic Neg liver ROS, hiatal hernia, GERD  ,  Endo/Other  diabetes  Renal/GU negative Renal ROS     Musculoskeletal   Abdominal   Peds  Hematology   Anesthesia Other Findings   Reproductive/Obstetrics                            Anesthesia Physical Anesthesia Plan  ASA: III  Anesthesia Plan: MAC   Post-op Pain Management:    Induction:   Airway Management Planned: Natural Airway  Additional Equipment:   Intra-op Plan:   Post-operative Plan:   Informed Consent: I have reviewed the patients History and Physical, chart, labs and discussed the procedure including the risks, benefits and alternatives for the proposed anesthesia with the patient or authorized representative who has indicated his/her understanding and acceptance.   Dental advisory given  Plan Discussed with: Anesthesiologist and CRNA  Anesthesia Plan Comments:        Anesthesia Quick Evaluation

## 2016-01-23 NOTE — Anesthesia Postprocedure Evaluation (Addendum)
Anesthesia Post Note  Patient: TENILLE MORRILL  Procedure(s) Performed: Procedure(s) (LRB): ESOPHAGOGASTRODUODENOSCOPY (EGD) (N/A)  Patient location during evaluation: PACU Anesthesia Type: MAC Level of consciousness: awake and alert Pain management: pain level controlled Vital Signs Assessment: post-procedure vital signs reviewed and stable Respiratory status: spontaneous breathing and respiratory function stable Cardiovascular status: stable Anesthetic complications: no       Last Vitals:  Vitals:   01/23/16 1131 01/23/16 1134  BP:  (!) 120/36  Pulse:  (!) 54  Resp:  14  Temp: 36.6 C     Last Pain:  Vitals:   01/23/16 1131  TempSrc: Oral  PainSc:                  Sarena Jezek DANIEL

## 2016-01-23 NOTE — Interval H&P Note (Signed)
History and Physical Interval Note:  [id number] AM  Katherine Carr  has presented today for surgery, with the diagnosis of Hematemesis  The various methods of treatment have been discussed with the patient and family. After consideration of risks, benefits and other options for treatment, the patient has consented to  Procedure(s): ESOPHAGOGASTRODUODENOSCOPY (EGD) (N/A) as a surgical intervention .  The patient's history has been reviewed, patient examined, no change in status, stable for surgery.  I have reviewed the patient's chart and labs.  Questions were answered to the patient's satisfaction.     Jeri Rawlins M  Assessment:  1. Coffee ground emesis.  Plan:  1.  Endoscopy. 2.  Risks (bleeding, infection, bowel perforation that could require surgery, sedation-related changes in cardiopulmonary systems), benefits (identification and possible treatment of source of symptoms, exclusion of certain causes of symptoms), and alternatives (watchful waiting, radiographic imaging studies, empiric medical treatment) of upper endoscopy (EGD) were explained to patient/family in detail and patient wishes to proceed.

## 2016-01-23 NOTE — Progress Notes (Signed)
PROGRESS NOTE    FLARENCE Carr  [id number] DOB: [birth year] DOA: 01/21/2016 PCP: Wenda Low, MD  Brief Narrative:  Katherine Carr is a 76 y.o. female with medical history significant of CVA with left sided weakness  ando other comorbidities who presents with the chief complain of hematemesis. Patiens has been complaining of abdominal pain, for the last 3 to 4 days, epigastric, constant, 10/10 intensity, sharp in nature, improve with pain medications, no worsening factors. Today the pain was associated with nausea and 3 episodes of hematemesis. She was transferred to the hospital for further evaluation. Patient also complained of dysuria and increase urinary frequency. Patient is blind and non ambulatory and depends on assistance for activities of daily living, including bathing and feeding. She was worked up and admitted for Hematemesis and Urinary Tract Infection. She underwent EGD 01/23/16 which showed erosive esophagitis.   Assessment & Plan:   Active Problems:   Hematemesis   Upper GI bleed   Acute cystitis with hematuria   Tachycardia  1. Hematemesis/Upper GI Bleed likely 2/2 to Erosive Esophagitis s/p EGD -Patient had multiple episodes of Coffee Ground Emesis -Appreciate [facility] Consultation for Recc's and further Evaluation -Patient underwent EGD today and EGD showed a small hiatal hernia, non-bleeding esophageal ulcers, LA Grade D Reflux Esophagitis, a few gastric polyps, duodenitis. -Gastroenterology suspects coffee ground emesis from severe erosive esophagitis, highly likely reflux mediated and exacerbated by prior use of clopidogrel -Recommends Holding Plavix for 2 weeks   -Full Liquid Diet now and Soft in AM as tolerated -C/w Protonix; Will require indefinitely  -Hb/Hct Dropped to 9.8/29.2 C/w Supportive IV fluids. Will hold Plavix or weeks . -C/w Zofran 4 mg po or IV q6hprn -Repeat CBC in AM and likely D/C Back to SNF if stable   2. Urinary Tract Infection.    -Urinalysis showed Many Bacteria, Trace Leukocytes, Positive Nitrites, and TNCT WBC with symptoms -Urine Cx not ordered on Admission, Urine Culture showed no Growth -Will place patient on IV ceftriaxone and continue to Monitor closely  3. Parkinsonism.  -Continue Sinemet 25-100 mg po TID  4. Depression/Fibromyalagia/Anxiety.  -Continue Duloxetine, gabapentin, melatonin, Seroquel, rivastigmine, Zanaflex.  5. Hypertension.  -Continue Metoprolol 50 mg, Lisinopril 5 mg po BID  for blood pressure control.  6. Hyperlipidemia -Continue with Pravastatin 20 mg po Daily  7. Type 2 Diabetes Mellitus.  -Will decrease dose of long-acting insulin to 25 units sq Daily -continue Sensitive Novolog insulin sliding scale for glucose coverage and monitoring.  8. Hx of Breast Cancer -Continue Anastrozole 1 mg po Daily  9. Hx of CVA -Hold Plavix for at least 2 weeks; C/w Pravastain 20 mg po Daily  DVT prophylaxis: SCDs Code Status: FULL CODE Family Communication: Friend and Ex-husband at bedside Disposition Plan: SNF in AM  Consultants:   Gastroenterology   Procedures: EGD    Findings:      A small hiatal hernia was present.      Few cratered esophageal ulcers with no bleeding were found.      LA Grade D (one or more mucosal breaks involving at least 75% of       esophageal circumference) esophagitis was found.      The exam of the esophagus was otherwise normal.      A few diminutive sessile polyps were found in the gastric fundus and in       the prepyloric region of the stomach. Given patient's age,       comoribidities, poor treatment candidate,  and highly likely benign       nature of these polyps, biopsies were not obtained.      The exam of the stomach was otherwise normal.      Patchy mild inflammation was found in the duodenal bulb.      The exam of the duodenum was otherwise normal. No old or fresh blood was       seen to the extent of our examination. Impression:                - Small hiatal hernia.                           - Non-bleeding esophageal ulcers.                           - LA Grade D reflux esophagitis.                           - A few gastric polyps.                           - Duodenitis.                           - Suspect coffee ground emesis from severe erosive                            esophagitis, highly like reflux-mediated and                            exacerbated by prior use of clopidigrel.   Antimicrobials:  Anti-infectives    Start     Dose/Rate Route Frequency Ordered Stop   01/22/16 1500  cefTRIAXone (ROCEPHIN) 1 g in dextrose 5 % 50 mL IVPB     1 g 100 mL/hr over 30 Minutes Intravenous Every 24 hours 01/21/16 1728     01/21/16 1500  cefTRIAXone (ROCEPHIN) 1 g in dextrose 5 % 50 mL IVPB     1 g 100 mL/hr over 30 Minutes Intravenous  Once 01/21/16 1450 01/21/16 1547     Subjective: Seen and examined prior to EGD and stated back was hurting. No reoccurence of Hematemesis. No N/V. States she was a little hungry.   Objective: Vitals:   01/23/16 1131 01/23/16 1134 01/23/16 1216 01/23/16 1331  BP:  (!) 120/36 (!) 151/54 131/68  Pulse:  (!) 54 (!) 58 (!) 54  Resp:  14 16 14   Temp: 97.9 F (36.6 C)  97.9 F (36.6 C) 98.4 F (36.9 C)  TempSrc: Oral   Axillary  SpO2:  96% 95% 98%  Weight:      Height:        Intake/Output Summary (Last 24 hours) at 01/23/16 1551 Last data filed at 01/23/16 1120  Gross per 24 hour  Intake              300 ml  Output              100 ml  Net              200 ml   Filed Weights   01/21/16 0834 01/23/16 1040  Weight: 89.4 kg (197 lb) 89.4 kg (197  lb)    Examination: Physical Exam:   Constitutional: Obese WF who is essentially bedbound and blind ENMT: External Ears, Nose appear normal. Grossly normal hearing.  Neck: Appears normal, supple, no cervical masses, normal ROM, no appreciable thyromegaly, no JVD Respiratory: Diminished to auscultation bilaterally, no wheezing,  rales, rhonchi or crackles. Normal respiratory effort and patient is not tachypenic. No accessory muscle use.  Cardiovascular: RRR, no murmurs / rubs / gallops. S1 and S2 auscultated.  Abdomen: Soft, non-tender, non-distended. No masses palpated. No appreciable hepatosplenomegaly. Bowel sounds positive x4.  GU: Deferred. Musculoskeletal: No clubbing / cyanosis of digits/nails.  Skin: No rashes, lesions, ulcers on limited skin evaluation No induration; Warm and dry.  Neurologic: Left sided weakeness on exam. Had leg and arm contractures. Romberg sign cerebellar reflexes not assessed.  Psychiatric: Normal judgment and insight. Alert and awake. Normal mood and appropriate affect.   Data Reviewed: I have personally reviewed following labs and imaging studies  CBC:  Recent Labs Lab 01/21/16 0810 01/21/16 1701 01/21/16 2242 01/22/16 0536 01/22/16 1201 01/23/16 0445  WBC 9.3  --   --  4.7  --  3.8*  NEUTROABS  --   --   --   --   --  1.7  HGB 12.9 11.6* 10.0* 10.2*  10.2* 10.7* 9.8*  HCT 38.2 34.7* 29.3* 30.8*  30.6* 32.1* 29.2*  MCV 92.7  --   --  93.9  --  90.4  PLT 162  --   --  128*  --  0000000*   Basic Metabolic Panel:  Recent Labs Lab 01/21/16 0810 01/22/16 0536 01/23/16 0445  NA 141 139 137  K 4.4 4.1 4.0  CL 102 102 104  CO2 30 31 27   GLUCOSE 281* 150* 197*  BUN 43* 39* 31*  CREATININE 1.36* 1.47* 1.13*  CALCIUM 8.9 8.3* 8.3*  MG  --   --  2.2  PHOS  --   --  3.0   GFR: Estimated Creatinine Clearance: 47.3 mL/min (by C-G formula based on SCr of 1.13 mg/dL (H)). Liver Function Tests:  Recent Labs Lab 01/21/16 0810 01/22/16 0536 01/23/16 0445  AST 18 15 14*  ALT 16 7* 5*  ALKPHOS 109 86 83  BILITOT 0.8 0.6 0.8  PROT 6.7 5.4* 5.5*  ALBUMIN 3.6 3.4* 2.8*    Recent Labs Lab 01/21/16 0830  LIPASE 46   No results for input(s): AMMONIA in the last 168 hours. Coagulation Profile: No results for input(s): INR, PROTIME in the last 168 hours. Cardiac  Enzymes: No results for input(s): CKTOTAL, CKMB, CKMBINDEX, TROPONINI in the last 168 hours. BNP (last 3 results) No results for input(s): PROBNP in the last 8760 hours. HbA1C: No results for input(s): HGBA1C in the last 72 hours. CBG:  Recent Labs Lab 01/22/16 1137 01/22/16 1653 01/22/16 2020 01/23/16 0826 01/23/16 1247  GLUCAP 131* 139* 178* 130* 82   Lipid Profile: No results for input(s): CHOL, HDL, LDLCALC, TRIG, CHOLHDL, LDLDIRECT in the last 72 hours. Thyroid Function Tests: No results for input(s): TSH, T4TOTAL, FREET4, T3FREE, THYROIDAB in the last 72 hours. Anemia Panel: No results for input(s): VITAMINB12, FOLATE, FERRITIN, TIBC, IRON, RETICCTPCT in the last 72 hours. Sepsis Labs:  Recent Labs Lab 01/21/16 0901 01/21/16 1227  LATICACIDVEN 1.59 1.58    Recent Results (from the past 240 hour(s))  MRSA PCR Screening     Status: None   Collection Time: 01/21/16  5:09 PM  Result Value Ref Range Status   MRSA by  PCR NEGATIVE NEGATIVE Final    Comment:        The GeneXpert MRSA Assay (FDA approved for NASAL specimens only), is one component of a comprehensive MRSA colonization surveillance program. It is not intended to diagnose MRSA infection nor to guide or monitor treatment for MRSA infections.   Culture, Urine     Status: None   Collection Time: 01/22/16  4:01 PM  Result Value Ref Range Status   Specimen Description URINE, RANDOM  Final   Special Requests NONE  Final   Culture   Final    NO GROWTH Performed at [facility], [address], [place], [place] [address]    Report Status 01/23/2016 FINAL  Final    Radiology Studies: No results found. Scheduled Meds: . anastrozole  1 mg Oral Daily  . atropine  1 drop Both Eyes Daily  . calcium carbonate  1 tablet Oral TID WC  . carbidopa-levodopa  1 tablet Oral TID  . cefTRIAXone (ROCEPHIN)  IV  1 g Intravenous Q24H  . cholecalciferol  2,000 Units Oral Daily  . DULoxetine  60 mg Oral BID  .  gabapentin  600 mg Oral BID  . insulin aspart  0-9 Units Subcutaneous TID WC  . insulin detemir  25 Units Subcutaneous QHS  . lisinopril  5 mg Oral BID  . loratadine  10 mg Oral Daily  . metoprolol succinate  50 mg Oral Daily  . pantoprazole (PROTONIX) IV  40 mg Intravenous Q12H  . polyethylene glycol  17 g Oral Daily  . pravastatin  20 mg Oral Daily  . prednisoLONE acetate  1 drop Left Eye Daily  . QUEtiapine  50 mg Oral QHS  . rivastigmine  1.5 mg Oral BID  . senna-docusate  1 tablet Oral BID  . sodium chloride flush  3 mL Intravenous Q12H  . tiZANidine  2 mg Oral BID   Continuous Infusions:   LOS: 1 day   Kerney Elbe, DO [facility] Pager [phone]  If 7PM-7AM, please contact night-coverage [email or web address] Password [facility] 01/23/2016, 3:51 PM

## 2016-01-23 NOTE — NC FL2 (Signed)
[place] MEDICAID FL2 LEVEL OF CARE SCREENING TOOL     IDENTIFICATION  Patient Name: Katherine Carr Birthdate: 09/15/1940 Sex: female Admission Date (Current Location): 01/21/2016  [place] and [place] Number:  Katherine Carr [id number] [place] and Address:  [facility],  [place], [place]      Provider Number: [id number]  Attending Physician Name and Address:  Kerney Elbe, DO  Relative Name and Phone Number:       Current Level of Care: Hospital Recommended Level of Care: [place] Prior Approval Number:    Date Approved/Denied:   PASRR Number: [id number]  Discharge Plan: SNF    Current Diagnoses: Patient Active Problem List   Diagnosis Date Noted  . Acute cystitis with hematuria   . Tachycardia   . Hematemesis 01/21/2016  . Upper GI bleed 01/21/2016  . UTI (lower urinary tract infection) 02/05/2013  . Neuropathy ([place]) 02/05/2013  . HTN (hypertension), malignant 02/05/2013  . Parkinson disease ([place]) 02/05/2013  . Fever 02/05/2013  . Acute encephalopathy 10/29/2012  . Elevated LFTs 10/29/2012  . Common bile duct dilatation 10/29/2012  . Primary cancer of upper inner quadrant of right female breast ([place]) 05/19/2012  . Carcinoma in situ of breast 04/11/2012  . Intraductal papilloma of breast 03/17/2012  . ANXIETY DEPRESSION 03/24/2008  . ABDOMINAL PAIN-MULTIPLE SITES 02/17/2008  . LIVER FUNCTION TESTS, ABNORMAL, HX OF 02/12/2008  . GASTROPARESIS 09/24/2007  . BACK PAIN 09/24/2007  . Chest pain, unspecified 09/24/2007  . IRRITABLE BOWEL SYNDROME 09/09/2007  . DYSPHAGIA 09/09/2007  . Abdominal pain, epigastric 09/09/2007  . ABDOMINAL PAIN-GENERALIZED 09/09/2007  . URI 07/08/2007  . CLAUDICATION, INTERMITTENT 04/21/2007  . FATIGUE 04/21/2007  . EDEMA 04/21/2007  . OBESITY 02/26/2007  . DEPRESSION 02/26/2007  . MIGRAINE HEADACHE 02/26/2007  . ALLERGIC RHINITIS 02/26/2007  . ASTHMA 02/26/2007  . GERD  02/26/2007  . DEGENERATIVE DISC DISEASE 02/26/2007  . FIBROMYALGIA 02/26/2007  . RHEUMATIC FEVER, HX OF 02/26/2007  . ACUTE BRONCHITIS 02/21/2007  . HYPERLIPIDEMIA 01/31/2007  . LIVER FUNCTION TESTS, ABNORMAL 01/10/2007  . IBD 09/17/2006  . DIABETES MELLITUS, TYPE II 09/04/2006  . HYPERLIPIDEMIA 09/04/2006  . COMMON MIGRAINE 09/04/2006  . HYPERTENSION 09/04/2006  . OSTEOARTHRITIS 09/04/2006  . SPONDYLOSIS, CERVICAL 09/04/2006  . CHEST PAIN, HX OF 09/04/2006  . ESOPHAGITIS 09/05/2004  . HIATAL HERNIA 09/05/2004  . HEMORRHOIDS, INTERNAL 02/18/2001  . GASTRITIS, CHRONIC 02/18/2001  . Diverticulosis of colon (without mention of hemorrhage) 02/18/2001    Orientation RESPIRATION BLADDER Height & Weight     Self, Time, Situation, Place  Normal Continent Weight: 197 lb (89.4 kg) Height:  5\' 4"  (162.6 cm)  BEHAVIORAL SYMPTOMS/MOOD NEUROLOGICAL BOWEL NUTRITION STATUS      Continent Diet  AMBULATORY STATUS COMMUNICATION OF NEEDS Skin   Extensive Assist Verbally Normal                       Personal Care Assistance Level of Assistance  Bathing, Dressing Bathing Assistance: Limited assistance   Dressing Assistance: Limited assistance     Functional Limitations Info             SPECIAL CARE FACTORS FREQUENCY                       Contractures      Additional Factors Info  Code Status, Allergies Code Status Info: DNR Allergies Info:  Sertraline Hcl, Codeine, Cyclobenzaprine, Cyclobenzaprine Hcl, Fentanyl, Furosemide, Gadolinium Derivatives, Hydrocodone-acetaminophen, Iohexol, Levofloxacin,  Lorazepam, Lubiprostone, Metformin, Metoclopramide, Morphine, Oxycodone, Promethazine Hcl, Rosiglitazone, Sertraline Hcl, Verapamil           Current Medications (01/23/2016):  This is the current hospital active medication list Current Facility-Administered Medications  Medication Dose Route Frequency Provider Last Rate Last Dose  . [MAR Hold] 0.9 %  sodium chloride  infusion  250 mL Intravenous PRN Tawni Millers, MD      . 0.9 %  sodium chloride infusion   Intravenous Continuous Teena Irani, MD 20 mL/hr at 01/23/16 1043    . [MAR Hold] acetaminophen (TYLENOL) tablet 650 mg  650 mg Oral Q6H PRN Tawni Millers, MD   650 mg at 01/21/16 2120   Or  . [MAR Hold] acetaminophen (TYLENOL) suppository 650 mg  650 mg Rectal Q6H PRN Tawni Millers, MD      . Doug Sou Hold] ALPRAZolam Duanne Moron) tablet 0.5 mg  0.5 mg Oral QHS PRN Tawni Millers, MD   0.5 mg at 01/22/16 2255  . [MAR Hold] anastrozole (ARIMIDEX) tablet 1 mg  1 mg Oral Daily Mauricio Gerome Apley, MD   1 mg at 01/22/16 1142  . [MAR Hold] atropine 1 % ophthalmic solution 1 drop  1 drop Both Eyes Daily Mauricio Gerome Apley, MD   1 drop at 01/22/16 1143  . [MAR Hold] calcium carbonate (OS-CAL - dosed in mg of elemental calcium) tablet 500 mg of elemental calcium  1 tablet Oral TID WC Tawni Millers, MD   500 mg of elemental calcium at 01/22/16 1635  . [MAR Hold] carbidopa-levodopa (SINEMET IR) 25-100 MG per tablet immediate release 1 tablet  1 tablet Oral TID Tawni Millers, MD   1 tablet at 01/22/16 2200  . [MAR Hold] cefTRIAXone (ROCEPHIN) 1 g in dextrose 5 % 50 mL IVPB  1 g Intravenous Q24H Anh P Pham, RPH   1 g at 01/22/16 1600  . [MAR Hold] cholecalciferol (VITAMIN D) tablet 2,000 Units  2,000 Units Oral Daily Mauricio Gerome Apley, MD   2,000 Units at 01/22/16 1139  . [MAR Hold] DULoxetine (CYMBALTA) DR capsule 60 mg  60 mg Oral BID Tawni Millers, MD   60 mg at 01/22/16 2200  . [MAR Hold] gabapentin (NEURONTIN) capsule 600 mg  600 mg Oral BID Tawni Millers, MD   600 mg at 01/22/16 2200  . [MAR Hold] insulin aspart (novoLOG) injection 0-9 Units  0-9 Units Subcutaneous TID WC Tawni Millers, MD   1 Units at 01/23/16 [phone]  . [MAR Hold] insulin detemir (LEVEMIR) injection 25 Units  25 Units Subcutaneous QHS Tawni Millers, MD   25  Units at 01/22/16 2200  . [MAR Hold] ipratropium-albuterol (DUONEB) 0.5-2.5 (3) MG/3ML nebulizer solution 3 mL  3 mL Nebulization Q6H PRN Tawni Millers, MD      . Doug Sou Hold] lisinopril (PRINIVIL,ZESTRIL) tablet 5 mg  5 mg Oral BID Tawni Millers, MD   5 mg at 01/22/16 2200  . [MAR Hold] loratadine (CLARITIN) tablet 10 mg  10 mg Oral Daily Mauricio Gerome Apley, MD   10 mg at 01/22/16 1140  . [MAR Hold] metoprolol succinate (TOPROL-XL) 24 hr tablet 50 mg  50 mg Oral Daily Mauricio Gerome Apley, MD   50 mg at 01/22/16 1141  . [MAR Hold] ondansetron (ZOFRAN) tablet 4 mg  4 mg Oral Q6H PRN Tawni Millers, MD       Or  . Doug Sou Hold] ondansetron [facility]) injection 4 mg  4 mg  Intravenous Q6H PRN Tawni Millers, MD      . Doug Sou Hold] pantoprazole (PROTONIX) injection 40 mg  40 mg Intravenous Q12H Tawni Millers, MD   40 mg at 01/22/16 2245  . [MAR Hold] polyethylene glycol (MIRALAX / GLYCOLAX) packet 17 g  17 g Oral Daily Tawni Millers, MD   17 g at 01/22/16 1143  . [MAR Hold] pravastatin (PRAVACHOL) tablet 20 mg  20 mg Oral Daily Mauricio Gerome Apley, MD   20 mg at 01/22/16 1635  . [MAR Hold] prednisoLONE acetate (PRED FORTE) 1 % ophthalmic suspension 1 drop  1 drop Left Eye Daily Mauricio Gerome Apley, MD   1 drop at 01/22/16 1143  . [MAR Hold] QUEtiapine (SEROQUEL) tablet 50 mg  50 mg Oral QHS Tawni Millers, MD   50 mg at 01/22/16 2200  . [MAR Hold] rivastigmine (EXELON) capsule 1.5 mg  1.5 mg Oral BID Tawni Millers, MD   1.5 mg at 01/22/16 2200  . [MAR Hold] senna-docusate (Senokot-S) tablet 1 tablet  1 tablet Oral BID Tawni Millers, MD   1 tablet at 01/22/16 2200  . [MAR Hold] sodium chloride flush (NS) 0.9 % injection 3 mL  3 mL Intravenous Q12H Mauricio Gerome Apley, MD   3 mL at 01/22/16 2200  . [MAR Hold] sodium chloride flush (NS) 0.9 % injection 3 mL  3 mL Intravenous PRN Tawni Millers, MD      . Doug Sou Hold]  tiZANidine (ZANAFLEX) tablet 2 mg  2 mg Oral BID Tawni Millers, MD   2 mg at 01/22/16 2200  . [MAR Hold] tiZANidine (ZANAFLEX) tablet 2 mg  2 mg Oral Q8H PRN Tawni Millers, MD      . Doug Sou Hold] traMADol Veatrice Bourbon) tablet 25 mg  25 mg Oral Q6H PRN Tawni Millers, MD   25 mg at 01/22/16 1634   Facility-Administered Medications Ordered in Other Encounters  Medication Dose Route Frequency Provider Last Rate Last Dose  . propofol (DIPRIVAN) 500 MG/50ML infusion    Continuous PRN Lind Covert, CRNA 53.6 mL/hr at 01/23/16 1120 100 mcg/kg/min at 01/23/16 1120     Discharge Medications: Please see discharge summary for a list of discharge medications.  Relevant Imaging Results:  Relevant Lab Results:   Additional Information SSN: [id number]  Standley Brooking, LCSW

## 2016-01-24 ENCOUNTER — Encounter (HOSPITAL_COMMUNITY): Payer: Self-pay | Admitting: Gastroenterology

## 2016-01-24 DIAGNOSIS — K922 Gastrointestinal hemorrhage, unspecified: Secondary | ICD-10-CM | POA: Diagnosis not present

## 2016-01-24 DIAGNOSIS — R000 Tachycardia, unspecified: Secondary | ICD-10-CM | POA: Diagnosis not present

## 2016-01-24 DIAGNOSIS — Z8673 Personal history of transient ischemic attack (TIA), and cerebral infarction without residual deficits: Secondary | ICD-10-CM

## 2016-01-24 DIAGNOSIS — I10 Essential (primary) hypertension: Secondary | ICD-10-CM

## 2016-01-24 DIAGNOSIS — K2211 Ulcer of esophagus with bleeding: Secondary | ICD-10-CM | POA: Diagnosis not present

## 2016-01-24 DIAGNOSIS — N3001 Acute cystitis with hematuria: Secondary | ICD-10-CM | POA: Diagnosis not present

## 2016-01-24 DIAGNOSIS — K920 Hematemesis: Secondary | ICD-10-CM | POA: Diagnosis not present

## 2016-01-24 LAB — COMPREHENSIVE METABOLIC PANEL
ALT: <5 U/L — ABNORMAL LOW (ref 14–54)
ANION GAP: 8 (ref 5–15)
AST: 13 U/L — ABNORMAL LOW (ref 15–41)
Albumin: 3 g/dL — ABNORMAL LOW (ref 3.5–5.0)
Alkaline Phosphatase: 82 U/L (ref 38–126)
BILIRUBIN TOTAL: 0.4 mg/dL (ref 0.3–1.2)
BUN: 25 mg/dL — AB (ref 6–20)
CO2: 26 mmol/L (ref 22–32)
Calcium: 8.3 mg/dL — ABNORMAL LOW (ref 8.9–10.3)
Chloride: 106 mmol/L (ref 101–111)
Creatinine, Ser: 1.04 mg/dL — ABNORMAL HIGH (ref 0.44–1.00)
GFR, EST AFRICAN AMERICAN: 60 mL/min — AB (ref >60)
GFR, EST NON AFRICAN AMERICAN: 52 mL/min — AB (ref >60)
Glucose, Bld: 111 mg/dL — ABNORMAL HIGH (ref 65–99)
POTASSIUM: 4.3 mmol/L (ref 3.5–5.1)
Sodium: 140 mmol/L (ref 135–145)
TOTAL PROTEIN: 5.8 g/dL — AB (ref 6.5–8.1)

## 2016-01-24 LAB — CBC WITH DIFFERENTIAL/PLATELET
Basophils Absolute: 0 10*3/uL (ref 0.0–0.1)
Basophils Relative: 1 %
EOS ABS: 0.3 10*3/uL (ref 0.0–0.7)
Eosinophils Relative: 8 %
HCT: 29.9 % — ABNORMAL LOW (ref 36.0–46.0)
Hemoglobin: 10.2 g/dL — ABNORMAL LOW (ref 12.0–15.0)
Lymphocytes Relative: 46 %
Lymphs Abs: 1.8 10*3/uL (ref 0.7–4.0)
MCH: 30.4 pg (ref 26.0–34.0)
MCHC: 34.1 g/dL (ref 30.0–36.0)
MCV: 89.3 fL (ref 78.0–100.0)
MONO ABS: 0.3 10*3/uL (ref 0.1–1.0)
MONOS PCT: 8 %
Neutro Abs: 1.5 10*3/uL — ABNORMAL LOW (ref 1.7–7.7)
Neutrophils Relative %: 37 %
Platelets: 138 10*3/uL — ABNORMAL LOW (ref 150–400)
RBC: 3.35 MIL/uL — ABNORMAL LOW (ref 3.87–5.11)
RDW: 14 % (ref 11.5–15.5)
WBC: 3.9 10*3/uL — ABNORMAL LOW (ref 4.0–10.5)

## 2016-01-24 LAB — GLUCOSE, CAPILLARY
GLUCOSE-CAPILLARY: 90 mg/dL (ref 65–99)
GLUCOSE-CAPILLARY: 187 mg/dL — AB (ref 65–99)

## 2016-01-24 LAB — MAGNESIUM: MAGNESIUM: 2.4 mg/dL (ref 1.7–2.4)

## 2016-01-24 LAB — PHOSPHORUS: PHOSPHORUS: 3.4 mg/dL (ref 2.5–4.6)

## 2016-01-24 MED ORDER — ALPRAZOLAM 0.5 MG PO TABS: 0.5000 mg | ORAL_TABLET | Freq: Every evening | ORAL | 0 refills | Status: AC | PRN

## 2016-01-24 MED ORDER — PANTOPRAZOLE SODIUM 40 MG PO TBEC: 40.0000 mg | DELAYED_RELEASE_TABLET | Freq: Two times a day (BID) | ORAL | 0 refills | Status: AC

## 2016-01-24 MED ORDER — CEFPODOXIME PROXETIL 200 MG PO TABS
100.0000 mg | ORAL_TABLET | Freq: Two times a day (BID) | ORAL | Status: DC
Start: 2016-01-25 — End: 2016-01-24

## 2016-01-24 MED ORDER — PANTOPRAZOLE SODIUM 40 MG PO TBEC
40.0000 mg | DELAYED_RELEASE_TABLET | Freq: Two times a day (BID) | ORAL | Status: DC
  Administered 2016-01-24: 40 mg via ORAL
  Filled 2016-01-24: qty 1

## 2016-01-24 MED ORDER — CEFPODOXIME PROXETIL 100 MG PO TABS: 100.0000 mg | ORAL_TABLET | Freq: Two times a day (BID) | ORAL | 0 refills | Status: DC

## 2016-01-24 MED ORDER — TRAMADOL HCL 50 MG PO TABS: 25.0000 mg | ORAL_TABLET | Freq: Four times a day (QID) | ORAL | 0 refills | Status: DC | PRN

## 2016-01-24 NOTE — Care Management Obs Status (Signed)
[facility] NOTIFICATION   Patient Details  Name: LARAY RUELL MRN: [id number] Date of Birth: 05-20-1940   Medicare Observation Status Notification Given:  Yes, pt is blind. Observation explained to pt's daughter.     Purcell Mouton, RN 01/24/2016, 9:49 AM

## 2016-01-24 NOTE — Progress Notes (Signed)
D/C instructions reviewed w/ pt son, Corene Cornea. Report called to Sherrie Sport at [facility]. All questions answered. No further questions. Contact number provided.

## 2016-01-24 NOTE — Progress Notes (Signed)
Subjective: Eating well. No abdominal pain. No complaints.  Objective: Vital signs in last 24 hours: Temp:  [97.7 F (36.5 C)-98.5 F (36.9 C)] 98.1 F (36.7 C) (01/23 0916) Pulse Rate:  [53-58] 57 (01/23 0916) Resp:  [14-16] 16 (01/23 0916) BP: (120-163)/(36-82) 157/82 (01/23 0916) SpO2:  [95 %-99 %] 99 % (01/23 0916) Weight:  [89.4 kg (197 lb)] 89.4 kg (197 lb) (01/22 1040) Weight change:  Last BM Date: 01/21/16  PE: GEN: Chronically ill-appearing, overweight, NAD ABD:  Protuberant, soft, non-tender  Lab Results: CBC    Component Value Date/Time   WBC 3.9 (L) 01/24/2016 0507   RBC 3.35 (L) 01/24/2016 0507   HGB 10.2 (L) 01/24/2016 0507   HGB 13.3 04/02/2013 1121   HCT 29.9 (L) 01/24/2016 0507   HCT 39.0 04/02/2013 1121   PLT 138 (L) 01/24/2016 0507   PLT 212 04/02/2013 1121   MCV 89.3 01/24/2016 0507   MCV 92.7 04/02/2013 1121   MCH 30.4 01/24/2016 0507   MCHC 34.1 01/24/2016 0507   RDW 14.0 01/24/2016 0507   RDW 14.8 (H) 04/02/2013 1121   LYMPHSABS 1.8 01/24/2016 0507   LYMPHSABS 1.6 04/02/2013 1121   MONOABS 0.3 01/24/2016 0507   MONOABS 0.3 04/02/2013 1121   EOSABS 0.3 01/24/2016 0507   EOSABS 0.2 04/02/2013 1121   BASOSABS 0.0 01/24/2016 0507   BASOSABS 0.0 04/02/2013 1121   CMP     Component Value Date/Time   NA 140 01/24/2016 0507   NA 140 04/02/2013 1121   K 4.3 01/24/2016 0507   K 4.2 04/02/2013 1121   CL 106 01/24/2016 0507   CL 97 (L) 05/21/2012 1432   CO2 26 01/24/2016 0507   CO2 28 04/02/2013 1121   GLUCOSE 111 (H) 01/24/2016 0507   GLUCOSE 373 (H) 04/02/2013 1121   GLUCOSE 408 (H) 05/21/2012 1432   BUN 25 (H) 01/24/2016 0507   BUN 26.6 (H) 04/02/2013 1121   CREATININE 1.04 (H) 01/24/2016 0507   CREATININE 0.9 04/02/2013 1121   CALCIUM 8.3 (L) 01/24/2016 0507   CALCIUM 9.4 04/02/2013 1121   PROT 5.8 (L) 01/24/2016 0507   PROT 6.4 04/02/2013 1121   ALBUMIN 3.0 (L) 01/24/2016 0507   ALBUMIN 3.2 (L) 04/02/2013 1121   AST 13 (L)  01/24/2016 0507   AST 15 04/02/2013 1121   ALT <5 (L) 01/24/2016 0507   ALT <6 04/02/2013 1121   ALKPHOS 82 01/24/2016 0507   ALKPHOS 175 (H) 04/02/2013 1121   BILITOT 0.4 01/24/2016 0507   BILITOT 0.55 04/02/2013 1121   GFRNONAA 52 (L) 01/24/2016 0507   GFRAA 60 (L) 01/24/2016 0507   Assessment:  1.  Hematemesis, resolved, suspect from reflux esophagitis in setting of clopidigrel. 2.  Reflux esophagitis, esophageal ulcer. 3.  Multiple medical problems, including stroke with clopidigrel. 4.  Acute on chronic anemia.  Plan:  1.  Soft mechanical diet.  Do not advance. 2.  No clopidigrel for 2 weeks, if clinically feasible. 3.  Pantoprazole 40 mg po bid x 6 weeks, then 40 mg po qd thereafter indefinitely. 4.  No further GI testing anticipated. 5.  [facility] will sign-off; please call with questions; thank you for the consultation; will need repeat CBC in 2-3 weeks at nursing facility; patient can follow-up with [facility] on as-needed basis.   Landry Dyke 01/24/2016, 10:14 AM   Pager [phone] If no answer or after 5 PM call [phone]

## 2016-01-24 NOTE — Discharge Summary (Addendum)
Physician Discharge Summary  JAEMARIE KOVATCH [id number] DOB: 12-Sep-1940 DOA: 01/21/2016  PCP: Wenda Low, MD  Admit date: 01/21/2016 Discharge date: 01/24/2016  Admitted From: SNF Disposition:  SNF  Recommendations for Outpatient Follow-up:  1. Follow up with PCP in 1-2 weeks; Follow up Care at SNF 2. Hold Clopidogrel/Plavix for 2 weeks to allow Erosive Esophagitis to heal 3. Continue Protonix 40 mg po BID x 6 weeks and then 40 mg po Daily thereafter indefinitely 4. Follow up with [facility] as an outpatient on an as needed basis 5. Please obtain BMP/CBC in one week 6. Please follow up on the following pending results:  Home Health: No Equipment/Devices: None  Discharge Condition: Stable CODE STATUS: DO NOT RESUSCITATE Diet recommendation: *Heart Healthy Carb Modified Soft Mechanical Diet*  Brief/Interim Summary: Paytin Eiser Shropshireis a 76 y.o.femalewith medical history significant of CVA with left sided weakness on Clopidogrel and has other comorbidities including HTN, IDDM, Parkinsonism who presents with the chief complain of hematemesis. Patiens has been complaining of abdominal pain, for the last 3 to 4 days,epigastric, constant, 10/10 intensity, sharp in nature, improve with pain medications, no worsening factors. Today the pain was associated with nausea and 3 episodes of hematemesis. She was transferred to the hospital for further evaluation. Patient also complained of dysuria and increase urinary frequency. Patient is blind and non ambulatory and depends on assistance for activities of daily living, including bathing and feeding. She was worked up and admitted for Hematemesis and Urinary Tract Infection. She underwent EGD 01/23/16 which showed erosive esophagitis. She improved and tolerated her Diet. Gastroenterology told her hold Plavix for 2 weeks and that she would need PPI BID for 6 weeks then Daily. She was deemed stable to D/C back to SNF and follow with  physician at facility and Gastroenterology as an outpatient as needed.   Discharge Diagnoses:  Active Problems:   Hematemesis   Upper GI bleed   Acute cystitis with hematuria   Tachycardia  1. Hematemesis/Upper GI Bleed likely 2/2 to Erosive Esophagitis s/p EGD -Patient had multiple episodes of Coffee Ground Emesis -Appreciated [facility] for Recc's and further Evaluation -Patient underwent EGD today and EGD showed a small hiatal hernia, non-bleeding esophageal ulcers, LA Grade D Reflux Esophagitis, a few gastric polyps, duodenitis. -Gastroenterology suspects coffee ground emesis from severe erosive esophagitis, highly likely reflux mediated and exacerbated by prior use of clopidogrel -Recommends Holding Plavix for 2 weeks   -Full Liquid Diet now and Soft in AM as tolerated -C/w Protonix 40 mg po BID x 6 weeks and then 40 mg po Daily thereafter indefinitely  -Hb/Hct stable now to 10.2/29.9. Will hold Plavix for 2 weeks and resume February 6th to allow healing for Erosive Esophagitis -Repeat CBC at SNF -Per GI Recommendations continue with Soft Mechanical Diet and Do not Advance   2. Urinary Tract Infection. -Urinalysis showed Many Bacteria, Trace Leukocytes, Positive Nitrites, and TNCT WBC with symptoms -Urine Cx not ordered on Admission, Urine Culture showed no Growth -Was on IV Ceftriaxone and changed to Cefuroxime 500 mg po BID x 1 more day for total of 5 day course.  3. Parkinsonism.  -Continue Sinemet 25-100 mg po TID  4. Depression/Fibromyalagia/Anxiety.  -Continue Duloxetine, Gabapentin, melatonin, Seroquel, Rivastigmine, Zanaflex.  5. Hypertension. -Continue Metoprolol 50 mg, Lisinopril 5 mg po BID  for blood pressure control.  6. Hyperlipidemia -Continue with Pravastatin 20 mg po Daily  7. Type 2 Diabetes Mellitus. -Resume Home Insulin Regimin  8. Hx of Breast Cancer -  Continue Anastrozole 1 mg po Daily  9. Hx of CVA -Hold Plavix for at least  2 weeks and may resume February 6th, 2018; C/w Pravastain 20 mg po Daily  Discharge Instructions  Discharge Instructions    Call MD for:  difficulty breathing, headache or visual disturbances    Complete by:  As directed    Call MD for:  extreme fatigue    Complete by:  As directed    Call MD for:  persistant dizziness or light-headedness    Complete by:  As directed    Call MD for:  persistant nausea and vomiting    Complete by:  As directed    Call MD for:  severe uncontrolled pain    Complete by:  As directed    Call MD for:  temperature >100.4    Complete by:  As directed    Diet - low sodium heart healthy    Complete by:  As directed    Discharge instructions    Complete by:  As directed    Follow up Care at SNF; Hold Plavix for 2 weeks; Take PPI for 6 weeks (twice a day and then 40 mg daily going forward). Follow up with PCP and Gastroenterology as an outpatient.   Increase activity slowly    Complete by:  As directed      Allergies as of 01/24/2016      Reactions   Sertraline Hcl Hives   Codeine Hives, Swelling   Cyclobenzaprine Swelling, Hives   Cyclobenzaprine Hcl Hives, Swelling   Fentanyl    REACTION: PANIC ATTACKS   Furosemide    REACTION: face was swollen and hand   Gadolinium Derivatives Swelling   Hydrocodone-acetaminophen    Iohexol     Code: SOB, Desc: xray dye, Onset Date: [id number]   Levofloxacin    REACTION: PANIC ATTACK,DIARRHEA,EYES DISCOLORD   Lorazepam Nausea And Vomiting   Lubiprostone    REACTION: REALLY BAD NAUSEA   Metformin Hives, Swelling   Metoclopramide Nausea And Vomiting, Swelling   Morphine    REACTION: itching, panic attacks   Oxycodone Hives, Swelling   Promethazine Hcl    REACTION: TRAMA ANDJITTERS IN MY BODY   Rosiglitazone Hives, Swelling   Sertraline Hcl Hives, Swelling   Verapamil Hives, Nausea And Vomiting      Medication List    STOP taking these medications   bisacodyl 10 MG suppository Commonly known as:   DULCOLAX   cefUROXime 500 MG tablet Commonly known as:  CEFTIN   clopidogrel 75 MG tablet Commonly known as:  PLAVIX   HYDROcodone-acetaminophen 5-325 MG tablet Commonly known as:  NORCO/VICODIN   hydrOXYzine 10 MG tablet Commonly known as:  ATARAX/VISTARIL   metroNIDAZOLE 500 MG tablet Commonly known as:  FLAGYL   rivastigmine 4.6 mg/24hr Commonly known as:  EXELON   temazepam 15 MG capsule Commonly known as:  RESTORIL     TAKE these medications   ALPRAZolam 0.5 MG tablet Commonly known as:  XANAX Take 1 tablet (0.5 mg total) by mouth at bedtime as needed for anxiety.   anastrozole 1 MG tablet Commonly known as:  ARIMIDEX Take 1 mg by mouth daily.   atropine 1 % ophthalmic solution Place 1 drop into both eyes daily   BIOFREEZE 4 % Gel Generic drug:  Menthol (Topical Analgesic) Apply 1 application topically 2 (two) times daily.   calcium carbonate 1250 (500 Ca) MG tablet Commonly known as:  OS-CAL - dosed in mg of elemental calcium Take  1 tablet by mouth 3 (three) times daily with meals.   carbidopa-levodopa 25-100 MG tablet Commonly known as:  SINEMET Take 1/2 tab twice daily x 3 days, then 1/2 tab three times daily x 4 days, then 1 tab three times daily What changed:  how much to take  how to take this  when to take this  additional instructions   cefpodoxime 100 MG tablet Commonly known as:  VANTIN Take 1 tablet (100 mg total) by mouth every 12 (twelve) hours. Start taking on:  01/25/2016   Cranberry 450 MG Tabs Take 450 mg by mouth 2 (two) times daily.   diclofenac sodium 1 % Gel Commonly known as:  VOLTAREN Apply 4 g topically 3 (three) times daily as needed (joint pain).   DSS 100 MG Caps Take 100 mg by mouth 2 (two) times daily.   DULoxetine 60 MG capsule Commonly known as:  CYMBALTA Take 60 mg by mouth 2 (two) times daily.   fluticasone 50 MCG/ACT nasal spray Commonly known as:  FLONASE Place 2 sprays into both nostrils daily as  needed for rhinitis or allergies.   furosemide 20 MG tablet Commonly known as:  LASIX Take 20 mg by mouth daily.   gabapentin 600 MG tablet Commonly known as:  NEURONTIN Take 600 mg by mouth 2 (two) times daily.   insulin detemir 100 unit/ml Soln Commonly known as:  LEVEMIR Inject 50 Units into the skin at bedtime.   insulin lispro 100 UNIT/ML injection Commonly known as:  HUMALOG Inject 60-80 Units into the skin 3 (three) times daily with meals. Inject 80 units at 8AM and noon and inject 60 units at supper time   ipratropium-albuterol 0.5-2.5 (3) MG/3ML Soln Commonly known as:  DUONEB Take 3 mLs by nebulization every 6 (six) hours as needed (shortness of breath).   lisinopril 5 MG tablet Commonly known as:  PRINIVIL,ZESTRIL Take 5 mg by mouth 2 (two) times daily.   loratadine 10 MG tablet Commonly known as:  CLARITIN Take 10 mg by mouth daily.   Melatonin 3 MG Tabs Take 3 mg by mouth at bedtime.   metoprolol succinate 50 MG 24 hr tablet Commonly known as:  TOPROL-XL   ondansetron 4 MG tablet Commonly known as:  ZOFRAN Take 4 mg by mouth every 8 (eight) hours as needed for nausea or vomiting.   pantoprazole 40 MG tablet Commonly known as:  PROTONIX Take 1 tablet (40 mg total) by mouth 2 (two) times daily.   polyethylene glycol powder powder Commonly known as:  GLYCOLAX/MIRALAX Take 17 g by mouth daily.   potassium chloride 10 MEQ tablet Commonly known as:  K-DUR,KLOR-CON Take 10 mEq by mouth daily.   pravastatin 20 MG tablet Commonly known as:  PRAVACHOL Take 20 mg by mouth daily.   prednisoLONE acetate 1 % ophthalmic suspension Commonly known as:  PRED FORTE Place 1 drop into the left eye daily.   QUEtiapine 50 MG tablet Commonly known as:  SEROQUEL Take 50 mg by mouth at bedtime. What changed:  Another medication with the same name was removed. Continue taking this medication, and follow the directions you see here.   rivastigmine 1.5 MG  capsule Commonly known as:  EXELON Take 1.5 mg by mouth 2 (two) times daily. What changed:  Another medication with the same name was removed. Continue taking this medication, and follow the directions you see here.   SENNA S PO Take 1 tablet by mouth 2 (two) times daily.   SORE  THROAT SPRAY 1.4 % Liqd Generic drug:  phenol Use as directed 1 spray in the mouth or throat every 4 (four) hours as needed for throat irritation / pain.   tiZANidine 2 MG tablet Commonly known as:  ZANAFLEX Take 2 mg by mouth every 8 (eight) hours as needed for muscle spasms.   tiZANidine 2 MG tablet Commonly known as:  ZANAFLEX Take 2 mg by mouth 2 (two) times daily.   traMADol 50 MG tablet Commonly known as:  ULTRAM Take 0.5 tablets (25 mg total) by mouth every 6 (six) hours as needed for moderate pain.   TYLENOL 325 MG tablet Generic drug:  acetaminophen Take 650 mg by mouth every 4 (four) hours as needed for moderate pain.   Vitamin D3 1000 units Caps Take 2,000 Units by mouth daily.       Allergies  Allergen Reactions  . Sertraline Hcl Hives  . Codeine Hives and Swelling  . Cyclobenzaprine Swelling and Hives  . Cyclobenzaprine Hcl Hives and Swelling  . Fentanyl     REACTION: PANIC ATTACKS  . Furosemide     REACTION: face was swollen and hand  . Gadolinium Derivatives Swelling  . Hydrocodone-Acetaminophen   . Iohexol      Code: SOB, Desc: xray dye, Onset Date: [id number]   . Levofloxacin     REACTION: PANIC ATTACK,DIARRHEA,EYES DISCOLORD  . Lorazepam Nausea And Vomiting  . Lubiprostone     REACTION: REALLY BAD NAUSEA  . Metformin Hives and Swelling  . Metoclopramide Nausea And Vomiting and Swelling  . Morphine     REACTION: itching, panic attacks  . Oxycodone Hives and Swelling  . Promethazine Hcl     REACTION: TRAMA ANDJITTERS IN MY BODY  . Rosiglitazone Hives and Swelling  . Sertraline Hcl Hives and Swelling  . Verapamil Hives and Nausea And Vomiting    Consultations:  Gastroenterology  Procedures/Studies: Dg Chest 2 View  Result Date: 01/21/2016 CLINICAL DATA:  Hematemesis EXAM: CHEST  2 VIEW COMPARISON:  02/04/2013 FINDINGS: There is mild bilateral interstitial thickening. There is no focal consolidation. There is no pleural effusion or pneumothorax. There is mild stable cardiomegaly. The osseous structures are unremarkable. IMPRESSION: 1. Cardiomegaly with mild pulmonary vascular congestion. Electronically Signed   By: Kathreen Devoid   On: 01/21/2016 11:47    EGD Findings: A small hiatal hernia was present. Few cratered esophageal ulcers with no bleeding were found. LA Grade D (one or more mucosal breaks involving at least 75% of  esophageal circumference) esophagitis was found. The exam of the esophagus was otherwise normal. A few diminutive sessile polyps were found in the gastric fundus and in  the prepyloric region of the stomach. Given patient's age,  comoribidities, poor treatment candidate, and highly likely benign  nature of these polyps, biopsies were not obtained. The exam of the stomach was otherwise normal. Patchy mild inflammation was found in the duodenal bulb. The exam of the duodenum was otherwise normal. No old or fresh blood was  seen to the extent of our examination. Impression: - Small hiatal hernia. - Non-bleeding esophageal ulcers. - LA Grade D reflux esophagitis. - A few gastric polyps. - Duodenitis. - Suspect coffee ground emesis from severe erosive  esophagitis, highly like reflux-mediated and  exacerbated by prior use of clopidigrel.  Subjective: Seen and examined at bedside and had no complaints. Tolerated her diet well this AM and did not have  any more hematemesis. No pain today.   Discharge Exam: Vitals:  01/24/16 0916 01/24/16 1000  BP: (!) 157/82 (!) 159/70  Pulse: (!) 57 (!) 52  Resp: 16 16  Temp: 98.1 F (36.7 C)    Vitals:   01/23/16 2201 01/24/16 0545 01/24/16 0916 01/24/16 1000  BP: (!) 157/64 (!) 145/70 (!) 157/82 (!) 159/70  Pulse: (!) 55 (!) 53 (!) 57 (!) 52  Resp: 15 16 16 16   Temp: 97.8 F (36.6 C) 98.5 F (36.9 C) 98.1 F (36.7 C)   TempSrc: Oral Oral Oral   SpO2: 98% 98% 99%   Weight:      Height:       General: Pt is awake, not in acute distress Cardiovascular: Slightly Bradycardic Heart Rate, S1/S2 +, no rubs, no gallops Respiratory: CTA bilaterally, no wheezing, no rhonchi; Patient not tachypenic or using any accessory muscles to breathe. Abdominal: Soft, NT, ND, bowel sounds + Extremities: no edema, no cyanosis; Legs with contractures and atropy; Left arm contracture  The results of significant diagnostics from this hospitalization (including imaging, microbiology, ancillary and laboratory) are listed below for reference.    Microbiology: Recent Results (from the past 240 hour(s))  MRSA PCR Screening     Status: None   Collection Time: 01/21/16  5:09 PM  Result Value Ref Range Status   MRSA by PCR NEGATIVE NEGATIVE Final    Comment:        The GeneXpert MRSA Assay (FDA approved for NASAL specimens only), is one component of a comprehensive MRSA colonization surveillance program. It is not intended to diagnose MRSA infection nor to guide or monitor treatment for MRSA infections.   Culture, Urine     Status: None   Collection Time: 01/22/16  4:01 PM  Result Value Ref Range Status   Specimen Description URINE, RANDOM  Final   Special Requests NONE  Final   Culture   Final    NO GROWTH Performed at [facility], [address] [place], [place] [address]    Report Status 01/23/2016 FINAL  Final    Labs: BNP (last 3 results) No results for input(s): BNP in the last 8760  hours. Basic Metabolic Panel:  Recent Labs Lab 01/21/16 0810 01/22/16 0536 01/23/16 0445 01/24/16 0507  NA 141 139 137 140  K 4.4 4.1 4.0 4.3  CL 102 102 104 106  CO2 30 31 27 26   GLUCOSE 281* 150* 197* 111*  BUN 43* 39* 31* 25*  CREATININE 1.36* 1.47* 1.13* 1.04*  CALCIUM 8.9 8.3* 8.3* 8.3*  MG  --   --  2.2 2.4  PHOS  --   --  3.0 3.4   Liver Function Tests:  Recent Labs Lab 01/21/16 0810 01/22/16 0536 01/23/16 0445 01/24/16 0507  AST 18 15 14* 13*  ALT 16 7* 5* <5*  ALKPHOS 109 86 83 82  BILITOT 0.8 0.6 0.8 0.4  PROT 6.7 5.4* 5.5* 5.8*  ALBUMIN 3.6 3.4* 2.8* 3.0*    Recent Labs Lab 01/21/16 0830  LIPASE 46   No results for input(s): AMMONIA in the last 168 hours. CBC:  Recent Labs Lab 01/21/16 0810  01/21/16 2242 01/22/16 0536 01/22/16 1201 01/23/16 0445 01/24/16 0507  WBC 9.3  --   --  4.7  --  3.8* 3.9*  NEUTROABS  --   --   --   --   --  1.7 1.5*  HGB 12.9  < > 10.0* 10.2*  10.2* 10.7* 9.8* 10.2*  HCT 38.2  < > 29.3* 30.8*  30.6* 32.1*  29.2* 29.9*  MCV 92.7  --   --  93.9  --  90.4 89.3  PLT 162  --   --  128*  --  128* 138*  < > = values in this interval not displayed. Cardiac Enzymes: No results for input(s): CKTOTAL, CKMB, CKMBINDEX, TROPONINI in the last 168 hours. BNP: Invalid input(s): POCBNP CBG:  Recent Labs Lab 01/23/16 1247 01/23/16 1636 01/23/16 2159 01/24/16 0756 01/24/16 1142  GLUCAP 82 201* 188* 90 187*   D-Dimer No results for input(s): DDIMER in the last 72 hours. Hgb A1c No results for input(s): HGBA1C in the last 72 hours. Lipid Profile No results for input(s): CHOL, HDL, LDLCALC, TRIG, CHOLHDL, LDLDIRECT in the last 72 hours. Thyroid function studies No results for input(s): TSH, T4TOTAL, T3FREE, THYROIDAB in the last 72 hours.  Invalid input(s): FREET3 Anemia work up No results for input(s): VITAMINB12, FOLATE, FERRITIN, TIBC, IRON, RETICCTPCT in the last 72 hours. Urinalysis    Component Value  Date/Time   COLORURINE YELLOW 01/21/2016 1210   APPEARANCEUR CLOUDY (A) 01/21/2016 1210   LABSPEC 1.019 01/21/2016 1210   PHURINE 5.0 01/21/2016 1210   GLUCOSEU 150 (A) 01/21/2016 1210   GLUCOSEU > or = 1000 mg/dL (AA) 09/09/2007 1431   HGBUR MODERATE (A) 01/21/2016 1210   HGBUR negative 01/27/2008 1543   BILIRUBINUR NEGATIVE 01/21/2016 1210   KETONESUR NEGATIVE 01/21/2016 1210   PROTEINUR >=300 (A) 01/21/2016 1210   UROBILINOGEN 1.0 02/04/2013 2309   NITRITE POSITIVE (A) 01/21/2016 1210   LEUKOCYTESUR TRACE (A) 01/21/2016 1210   Sepsis Labs Invalid input(s): PROCALCITONIN,  WBC,  LACTICIDVEN Microbiology Recent Results (from the past 240 hour(s))  MRSA PCR Screening     Status: None   Collection Time: 01/21/16  5:09 PM  Result Value Ref Range Status   MRSA by PCR NEGATIVE NEGATIVE Final    Comment:        The GeneXpert MRSA Assay (FDA approved for NASAL specimens only), is one component of a comprehensive MRSA colonization surveillance program. It is not intended to diagnose MRSA infection nor to guide or monitor treatment for MRSA infections.   Culture, Urine     Status: None   Collection Time: 01/22/16  4:01 PM  Result Value Ref Range Status   Specimen Description URINE, RANDOM  Final   Special Requests NONE  Final   Culture   Final    NO GROWTH Performed at [facility], [address] [place], [place] [address]    Report Status 01/23/2016 FINAL  Final   Time coordinating discharge: Over 30 minutes  SIGNED:  Kerney Elbe, DO [facility] 01/24/2016, 12:02 PM Pager [phone]  If 7PM-7AM, please contact night-coverage [email or web address] Password [email or web address]

## 2016-01-24 NOTE — Progress Notes (Signed)
Patient is set to discharge back to [facility] today. Patient & family at bedside made aware. Discharge packet given to RN, Elzie Rings. [facility] called for transport to pickup at 2:00pm.     Raynaldo Opitz, [place] Worker cell #: [phone]

## 2016-01-24 NOTE — Care Management Note (Signed)
Case Management Note  Patient Details  Name: Katherine Carr MRN: [id number] Date of Birth: February 16, 1940  Subjective/Objective:  Nursing home resident transferred due to staff witnessing to 3 episodes of emesis described as coffee ground                   Action/Plan:  Plan to return to SNF [facility]   Expected Discharge Date:  01/24/16               Expected Discharge Plan:  [place]  In-House Referral:  Clinical Social Work  Discharge planning Services  CM Consult  Post Acute Care Choice:    Choice offered to:     DME Arranged:  N/A DME Agency:  NA  HH Arranged:    [place] Agency:  NA  Status of Service:  Completed, signed off  If discussed at [employer], dates discussed:    Additional CommentsPurcell Mouton, RN 01/24/2016, 2:02 PM

## 2016-03-29 ENCOUNTER — Emergency Department (HOSPITAL_COMMUNITY)
Admission: EM | Admit: 2016-03-29 | Discharge: 2016-03-29 | Disposition: A | Discharge status: Home / Self Care | Payer: Medicare Other | Attending: Emergency Medicine | Admitting: Emergency Medicine

## 2016-03-29 ENCOUNTER — Encounter (HOSPITAL_COMMUNITY): Payer: Self-pay | Admitting: *Deleted

## 2016-03-29 ENCOUNTER — Emergency Department (HOSPITAL_COMMUNITY): Payer: Medicare Other

## 2016-03-29 DIAGNOSIS — Z853 Personal history of malignant neoplasm of breast: Secondary | ICD-10-CM | POA: Diagnosis not present

## 2016-03-29 DIAGNOSIS — H4089 Other specified glaucoma: Secondary | ICD-10-CM | POA: Diagnosis not present

## 2016-03-29 DIAGNOSIS — J449 Chronic obstructive pulmonary disease, unspecified: Secondary | ICD-10-CM | POA: Diagnosis not present

## 2016-03-29 DIAGNOSIS — Z87891 Personal history of nicotine dependence: Secondary | ICD-10-CM | POA: Insufficient documentation

## 2016-03-29 DIAGNOSIS — I10 Essential (primary) hypertension: Secondary | ICD-10-CM | POA: Diagnosis not present

## 2016-03-29 DIAGNOSIS — G20 Parkinson's disease: Secondary | ICD-10-CM | POA: Diagnosis not present

## 2016-03-29 DIAGNOSIS — Z794 Long term (current) use of insulin: Secondary | ICD-10-CM | POA: Diagnosis not present

## 2016-03-29 DIAGNOSIS — E1140 Type 2 diabetes mellitus with diabetic neuropathy, unspecified: Secondary | ICD-10-CM | POA: Insufficient documentation

## 2016-03-29 DIAGNOSIS — H5711 Ocular pain, right eye: Secondary | ICD-10-CM | POA: Diagnosis present

## 2016-03-29 DIAGNOSIS — H4053X3 Glaucoma secondary to other eye disorders, bilateral, severe stage: Secondary | ICD-10-CM

## 2016-03-29 DIAGNOSIS — Z79899 Other long term (current) drug therapy: Secondary | ICD-10-CM | POA: Diagnosis not present

## 2016-03-29 MED ORDER — TETRACAINE HCL 0.5 % OP SOLN
1.0000 [drp] | Freq: Once | OPHTHALMIC | Status: AC
  Administered 2016-03-29: 1 [drp] via OPHTHALMIC
  Filled 2016-03-29: qty 4

## 2016-03-29 MED ORDER — ONDANSETRON 4 MG PO TBDP
4.0000 mg | ORAL_TABLET | Freq: Once | ORAL | Status: AC
  Administered 2016-03-29: 4 mg via ORAL
  Filled 2016-03-29: qty 1

## 2016-03-29 MED ORDER — FLUORESCEIN SODIUM 0.6 MG OP STRP
1.0000 | ORAL_STRIP | Freq: Once | OPHTHALMIC | Status: AC
  Administered 2016-03-29: 1 via OPHTHALMIC
  Filled 2016-03-29: qty 1

## 2016-03-29 MED ORDER — TRAMADOL HCL 50 MG PO TABS: 50.0000 mg | ORAL_TABLET | Freq: Four times a day (QID) | ORAL | 0 refills | Status: DC | PRN

## 2016-03-29 MED ORDER — TRAMADOL HCL 50 MG PO TABS
50.0000 mg | ORAL_TABLET | Freq: Once | ORAL | Status: AC
  Administered 2016-03-29: 50 mg via ORAL
  Filled 2016-03-29: qty 1

## 2016-03-29 NOTE — ED Notes (Signed)
Bed: WA21 Expected date:  Expected time:  Means of arrival:  Comments: EMS eye pain

## 2016-03-29 NOTE — ED Notes (Signed)
Attempted visual acuity screening. Pt refuses to open eyes at this time.

## 2016-03-29 NOTE — ED Notes (Signed)
Discharge information reviewed with Barbaraann Rondo, RN. [facility] called for transport of pt.

## 2016-03-29 NOTE — Discharge Instructions (Signed)
Your elevated blood pressure is due to pain. You need to see your ophthalmologist for definitive management.  We spoke with Dr. Zadie Rhine on the phone. Continue atropine and predforte eye drops. Use tramadol and tylenol for pain control.

## 2016-03-29 NOTE — ED Triage Notes (Signed)
Pt bib EMS from [place] [place] facility.  Pt reports eye pain x 2 days.  Staff reports they have been treating pt's eye x 1 week with eye drops.  Pt a/o per pt baseline.  Pt has elevated BP in which a clonidine patch was applied by staff.  Pt had an eye appt today but staff at facility canceled the appointment d/t pt's BP.

## 2016-03-29 NOTE — ED Provider Notes (Signed)
[facility] DEPT Provider Note   CSN: [id number] Arrival date & time: 03/29/16  1320     History   Chief Complaint Chief Complaint  Patient presents with  . Eye Pain    Right    HPI Katherine Carr is a 76 y.o. female.  HPI 76 year old female who presents with right eye pain. History of Parkinson's disease, DM, COPD. 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 2 days of right eye pain that has been severe, not relieved by tramadol. [facility] started her on eye drops (? Prednisone and atropine drops on her medication list). No relief of symptoms. No fever, chills, eye drainage. Blood pressure elevated over past day and placed on clonidine patch. No fall, trauma or injury.  Past Medical History:  Diagnosis Date  . Anxiety   . Asthma    bronchial  . Breast cancer ([place])    "both sides" (10/29/2012)  . COPD (chronic obstructive pulmonary disease) ([place])   . Depression   . Diabetes mellitus   . Diverticulosis   . Fatty liver    hx: of  . Fibromyalgia   . Fibrous breast lumps   . GERD (gastroesophageal reflux disease)   . H/O hiatal hernia    Archie Endo 09/12/2007  (10/29/2012)  . Herpes   . Hypertension   . Left carotid stenosis    Hx: of  . Migraines    Archie Endo 09/01/2001 (10/29/2012)  . Osteoarthritis    Archie Endo 09/12/2007  (10/29/2012)  . Ovarian cyst   . Peripheral neuropathy ([place])   . Urinary incontinence   . Wears glasses     Patient Active Problem List   Diagnosis Date Noted  . Acute cystitis with hematuria   . Tachycardia   . Hematemesis 01/21/2016  . Upper GI bleed 01/21/2016  . UTI (lower urinary tract infection) 02/05/2013  . Neuropathy ([place]) 02/05/2013  . HTN (hypertension), malignant 02/05/2013  . Parkinson disease ([place]) 02/05/2013  . Fever 02/05/2013  . Acute encephalopathy 10/29/2012  . Elevated LFTs 10/29/2012  . Common bile duct dilatation 10/29/2012  . Primary cancer of upper inner quadrant of right female breast ([place])  05/19/2012  . Carcinoma in situ of breast 04/11/2012  . Intraductal papilloma of breast 03/17/2012  . ANXIETY DEPRESSION 03/24/2008  . ABDOMINAL PAIN-MULTIPLE SITES 02/17/2008  . LIVER FUNCTION TESTS, ABNORMAL, HX OF 02/12/2008  . GASTROPARESIS 09/24/2007  . BACK PAIN 09/24/2007  . Chest pain, unspecified 09/24/2007  . IRRITABLE BOWEL SYNDROME 09/09/2007  . DYSPHAGIA 09/09/2007  . Abdominal pain, epigastric 09/09/2007  . ABDOMINAL PAIN-GENERALIZED 09/09/2007  . URI 07/08/2007  . CLAUDICATION, INTERMITTENT 04/21/2007  . FATIGUE 04/21/2007  . EDEMA 04/21/2007  . OBESITY 02/26/2007  . DEPRESSION 02/26/2007  . MIGRAINE HEADACHE 02/26/2007  . ALLERGIC RHINITIS 02/26/2007  . ASTHMA 02/26/2007  . GERD 02/26/2007  . DEGENERATIVE DISC DISEASE 02/26/2007  . FIBROMYALGIA 02/26/2007  . RHEUMATIC FEVER, HX OF 02/26/2007  . ACUTE BRONCHITIS 02/21/2007  . HYPERLIPIDEMIA 01/31/2007  . LIVER FUNCTION TESTS, ABNORMAL 01/10/2007  . IBD 09/17/2006  . DIABETES MELLITUS, TYPE II 09/04/2006  . HYPERLIPIDEMIA 09/04/2006  . COMMON MIGRAINE 09/04/2006  . HYPERTENSION 09/04/2006  . OSTEOARTHRITIS 09/04/2006  . SPONDYLOSIS, CERVICAL 09/04/2006  . CHEST PAIN, HX OF 09/04/2006  . ESOPHAGITIS 09/05/2004  . HIATAL HERNIA 09/05/2004  . HEMORRHOIDS, INTERNAL 02/18/2001  . GASTRITIS, CHRONIC 02/18/2001  . Diverticulosis of colon (without mention of hemorrhage) 02/18/2001    Past Surgical History:  Procedure Laterality Date  . ABDOMINAL  HYSTERECTOMY    . ANTERIOR CERVICAL DECOMP/DISCECTOMY FUSION  09/2003   Archie Endo 09/25/2003 (10/29/2012)  . APPENDECTOMY    . BACK SURGERY     central decompression L5-S1/notes 10/23/2005 (10/29/2012)  . BREAST DUCTAL SYSTEM EXCISION Left    Archie Endo 04/02/2012  (10/29/2012)  . BREAST LUMPECTOMY Right 04/21/2012   Procedure: Right breast LUMPECTOMY with re-excision of margins;  Surgeon: Adin Hector, MD;  Location: [facility];  Service: General;  Laterality: Right;  .  BREAST LUMPECTOMY WITH NEEDLE LOCALIZATION Right 04/02/2012   Procedure: Excision Ductal System Right Breast with Needle Localization;  Surgeon: Adin Hector, MD;  Location: [facility];  Service: General;  Laterality: Right;  needle localization at 7:30 at [facility]   . CARPAL TUNNEL RELEASE Bilateral   . CESAREAN SECTION    . ESOPHAGOGASTRODUODENOSCOPY N/A 01/23/2016   Procedure: ESOPHAGOGASTRODUODENOSCOPY (EGD);  Surgeon: Arta Silence, MD;  Location: [facility];  Service: Endoscopy;  Laterality: N/A;  . MYOMECTOMY    . NEVUS EXCISION Left 04/02/2012   Procedure: Excision Nevus Left Abdominal Wall;  Surgeon: Adin Hector, MD;  Location: [facility];  Service: General;  Laterality: Left;  . TONSILLECTOMY      OB History    Gravida Para Term Preterm AB Living   1 1       1    SAB TAB Ectopic Multiple Live Births                   Home Medications    Prior to Admission medications   Medication Sig Start Date End Date Taking? Authorizing Provider  acetaminophen (TYLENOL) 325 MG tablet Take 650 mg by mouth every 4 (four) hours as needed for moderate pain.   Yes Historical Provider, MD  anastrozole (ARIMIDEX) 1 MG tablet Take 1 mg by mouth daily.   Yes Historical Provider, MD  atropine 1 % ophthalmic solution Place 1 drop into both eyes daily   Yes Historical Provider, MD  Cholecalciferol (VITAMIN D3) 1000 UNITS CAPS Take 2,000 Units by mouth daily.   Yes Historical Provider, MD  Cranberry 450 MG TABS Take 450 mg by mouth 2 (two) times daily.    Yes Historical Provider, MD  diclofenac sodium (VOLTAREN) 1 % GEL Apply 4 g topically 3 (three) times daily as needed (joint pain).   Yes Historical Provider, MD  docusate sodium 100 MG CAPS Take 100 mg by mouth 2 (two) times daily. 02/06/13  Yes Bobby Rumpf York, PA-C  DULoxetine (CYMBALTA) 60 MG capsule Take 60 mg by mouth 2 (two) times daily.   Yes Historical Provider, MD  fluticasone (FLONASE) 50 MCG/ACT nasal spray  Place 2 sprays into both nostrils daily as needed for rhinitis or allergies. 02/06/13  Yes Bobby Rumpf York, PA-C  furosemide (LASIX) 20 MG tablet Take 20 mg by mouth daily.  12/29/15  Yes Historical Provider, MD  gabapentin (NEURONTIN) 600 MG tablet Take 600 mg by mouth 2 (two) times daily.    Yes Historical Provider, MD  insulin detemir (LEVEMIR) 100 unit/ml SOLN Inject 50-100 Units into the skin 2 (two) times daily. Takes 100 units in the morning and 50 units at bedtime   Yes Historical Provider, MD  insulin lispro (HUMALOG) 100 UNIT/ML injection Inject 60-80 Units into the skin 3 (three) times daily with meals. Inject 80 units at 8AM and noon and inject 60 units at supper time   Yes Historical Provider, MD  ipratropium-albuterol (DUONEB) 0.5-2.5 (3) MG/3ML SOLN Take 3  mLs by nebulization every 6 (six) hours as needed (shortness of breath). 02/06/13  Yes Bobby Rumpf York, PA-C  lisinopril (PRINIVIL,ZESTRIL) 5 MG tablet Take 5 mg by mouth 2 (two) times daily.  01/10/16  Yes Historical Provider, MD  loratadine (CLARITIN) 10 MG tablet Take 10 mg by mouth daily.   Yes Historical Provider, MD  Melatonin 3 MG TABS Take 3 mg by mouth at bedtime.   Yes Historical Provider, MD  Menthol, Topical Analgesic, (BIOFREEZE) 4 % GEL Apply 1 application topically 2 (two) times daily. Apply to buttock's   Yes Historical Provider, MD  Menthol, Topical Analgesic, (BIOFREEZE) 4 % GEL Apply 1 application topically 4 (four) times daily as needed (for pain).   Yes Historical Provider, MD  metoprolol succinate (TOPROL-XL) 50 MG 24 hr tablet Take 50 mg by mouth daily.  01/06/16  Yes Historical Provider, MD  ondansetron (ZOFRAN) 4 MG tablet Take 4 mg by mouth every 8 (eight) hours as needed for nausea or vomiting.   Yes Historical Provider, MD  pantoprazole (PROTONIX) 40 MG tablet Take 1 tablet (40 mg total) by mouth 2 (two) times daily. Patient taking differently: Take 40 mg by mouth daily.  01/24/16  Yes Harold, DO  phenol  (SORE THROAT SPRAY) 1.4 % LIQD Use as directed 1 spray in the mouth or throat every 4 (four) hours as needed for throat irritation / pain.   Yes Historical Provider, MD  phenylephrine-shark liver oil-mineral oil-petrolatum (PREPARATION H) 0.25-3-14-71.9 % rectal ointment Place 1 application rectally 2 (two) times daily as needed for hemorrhoids.   Yes Historical Provider, MD  polyethylene glycol powder (GLYCOLAX/MIRALAX) powder Take 17 g by mouth daily.  01/06/16  Yes Historical Provider, MD  potassium chloride (K-DUR,KLOR-CON) 10 MEQ tablet Take 10 mEq by mouth daily.  12/09/15  Yes Historical Provider, MD  pravastatin (PRAVACHOL) 20 MG tablet Take 20 mg by mouth daily.  12/27/15  Yes Historical Provider, MD  prednisoLONE acetate (PRED FORTE) 1 % ophthalmic suspension Place 1 drop into the left eye daily.  01/10/16  Yes Historical Provider, MD  QUEtiapine (SEROQUEL) 25 MG tablet Take 25 mg by mouth 2 (two) times daily.   Yes Historical Provider, MD  rivastigmine (EXELON) 1.5 MG capsule Take 1.5 mg by mouth 2 (two) times daily.  01/01/16  Yes Historical Provider, MD  Sennosides-Docusate Sodium (SENNA S PO) Take 1 tablet by mouth 2 (two) times daily.    Yes Historical Provider, MD  tiZANidine (ZANAFLEX) 2 MG tablet Take 2 mg by mouth 2 (two) times daily.  01/10/16  Yes Historical Provider, MD  tiZANidine (ZANAFLEX) 2 MG tablet Take 2 mg by mouth every 8 (eight) hours as needed for muscle spasms.   Yes Historical Provider, MD  traMADol (ULTRAM) 50 MG tablet Take 0.5 tablets (25 mg total) by mouth every 6 (six) hours as needed for moderate pain. 01/24/16  Yes [place], DO  ALPRAZolam (XANAX) 0.5 MG tablet Take 1 tablet (0.5 mg total) by mouth at bedtime as needed for anxiety. Patient not taking: Reported on 03/29/2016 01/24/16   Kerney Elbe, DO  carbidopa-levodopa (SINEMET) 25-100 MG tablet Take 1/2 tab twice daily x 3 days, then 1/2 tab three times daily x 4 days, then 1 tab three times  daily Patient not taking: Reported on 03/29/2016 11/24/14   Pieter Partridge, DO  cefpodoxime (VANTIN) 100 MG tablet Take 1 tablet (100 mg total) by mouth every 12 (twelve) hours. Patient not taking: Reported  on 03/29/2016 01/25/16   Kerney Elbe, DO  traMADol (ULTRAM) 50 MG tablet Take 1 tablet (50 mg total) by mouth every 6 (six) hours as needed for moderate pain or severe pain. 03/29/16   Forde Dandy, MD    Family History Family History  Problem Relation Age of Onset  . Heart disease Mother   . Heart disease Brother   . Diabetes Brother   . Arthritis Sister   . Stroke Sister   . Cirrhosis Brother   . Cancer Brother     Bladder cancer    Social History Social History  Substance Use Topics  . Smoking status: Former Smoker    Years: 20.00    Types: Cigarettes    Quit date: 08/15/1996  . Smokeless tobacco: Never Used     Comment: smoked 2-3 cigarettes/day  . Alcohol use No     Allergies   Sertraline hcl; Accolate [zafirlukast]; Avandia [rosiglitazone]; Codeine; Cyclobenzaprine; Furosemide; Gadolinium derivatives; Hydrocodone-acetaminophen; Iohexol; Levofloxacin; Lorazepam; Lubiprostone; Metformin; Metoclopramide; Oxycodone; Promethazine hcl; Verapamil; Fentanyl; and Morphine   Review of Systems Review of Systems 10/14 systems reviewed and are negative other than those stated in the HPI   Physical Exam Updated Vital Signs BP (!) 189/50 (BP Location: Right Arm)   Pulse 60   Temp 97.8 F (36.6 C) (Oral)   Resp 17   SpO2 94%   Physical Exam Physical Exam  Nursing note and vitals reviewed. Constitutional: on-toxic, and in moderate distress secondary to pain Head: Normocephalic and atraumatic.  Mouth/Throat: Oropharynx is clear and moist.  Eye: both pupils about 3-4 mm, does not respond to light. Right eye red, with potential mild proptosis. No drainage.  Neck: Normal range of motion. Neck supple.  Cardiovascular: Normal rate and regular rhythm.   Pulmonary/Chest:  Effort normal and breath sounds normal.  Abdominal: Soft. There is no tenderness. There is no rebound and no guarding.  Musculoskeletal: Normal range of motion.  Neurological: Alert, no facial droop, fluent speech, moves all extremities symmetrically Skin: Skin is warm and dry.  Psychiatric: Cooperative '  ED Treatments / Results  Labs (all labs ordered are listed, but only abnormal results are displayed) Labs Reviewed - No data to display  EKG  EKG Interpretation None       Radiology Ct Orbits Wo Contrast  Result Date: 03/29/2016 CLINICAL DATA:  Right eye redness, mild proptosis, and severe pain with normal intraocular pressure. Chronic bilateral blindness. EXAM: CT ORBITS WITHOUT CONTRAST TECHNIQUE: Multidetector CT images were obtained using the standard protocol without intravenous contrast. COMPARISON:  Head CT 11/11/2015. Neck CTA 03/19/2013. Brain MRI 10/28/2012. FINDINGS: Orbits: Prior bilateral lens extractions. Unremarkable appearance of the extraocular muscles, optic nerve complexes, and orbital fat without evidence of mass or postseptal inflammatory change. At most slight asymmetric right orbital preseptal soft tissue thickening. No fluid collection. Visualized sinuses: Minimal mucosal thickening in the ethmoid air cells bilaterally and in the right sphenoid sinus. Trace bilateral mastoid fluid. Soft tissues: Negative. Limited intracranial: Partially visualized cerebral atrophy, similar to the prior head CT. IMPRESSION: At most minimal right orbital preseptal soft tissue thickening. No evidence of postseptal cellulitis, fluid collection, or mass. Electronically Signed   By: Logan Bores M.D.   On: 03/29/2016 15:30    Procedures Procedures (including critical care time)  Medications Ordered in ED Medications  traMADol (ULTRAM) tablet 50 mg (50 mg Oral Given 03/29/16 1420)  tetracaine (PONTOCAINE) 0.5 % ophthalmic solution 1 drop (1 drop Both Eyes Given by Other  03/29/16 1421)   fluorescein ophthalmic strip 1 strip (1 strip Both Eyes Given by Other 03/29/16 1420)  ondansetron (ZOFRAN-ODT) disintegrating tablet 4 mg (4 mg Oral Given 03/29/16 1422)     Initial Impression / Assessment and Plan / ED Course  I have reviewed the triage vital signs and the nursing notes.  Pertinent labs & imaging results that were available during my care of the patient were reviewed by me and considered in my medical decision making (see chart for details).    Unable to get visual acuity due to chronic blindness, and she states this is unchanged. Right eye red, potentially mild proptosis. With normal IOP of 1,2,2 in both eyes.  Exam with fluorescein unremarkable. Ct orbits without signs of infection.  Spoke with Dr. Zadie Rhine, patient's ophthalmologist on the phone. She has history of neovascular glaucoma that has been difficult to manage with eye injections and eye drops. States she is a poor surgical candidate and there is not much else to do for her aside from pain control. She can reschedule appointment with him for re-evaluation.  Pain control somewhat difficult as she had allergy and intolerance to fentanyl, morphine, Dilaudid, oxycodone and hydrocodone. I did give higher dose of tramadol for pain control. Suspect that her elevated blood pressure today is more from pain.  Patient to be discharged with outpatient ophthalmology follow-up instructions.   Final Clinical Impressions(s) / ED Diagnoses   Final diagnoses:  Neovascular glaucoma of both eyes, severe stage    New Prescriptions New Prescriptions   TRAMADOL (ULTRAM) 50 MG TABLET    Take 1 tablet (50 mg total) by mouth every 6 (six) hours as needed for moderate pain or severe pain.     Forde Dandy, MD 03/29/16 [phone]

## 2016-05-21 ENCOUNTER — Emergency Department (HOSPITAL_COMMUNITY): Payer: Medicare Other

## 2016-05-21 ENCOUNTER — Observation Stay (HOSPITAL_COMMUNITY)
Admission: EM | Admit: 2016-05-21 | Discharge: 2016-05-23 | Disposition: A | Discharge status: Skilled Nursing Facility | Payer: Medicare Other | Attending: Internal Medicine | Admitting: Internal Medicine

## 2016-05-21 ENCOUNTER — Encounter (HOSPITAL_COMMUNITY): Payer: Self-pay

## 2016-05-21 DIAGNOSIS — K219 Gastro-esophageal reflux disease without esophagitis: Secondary | ICD-10-CM | POA: Insufficient documentation

## 2016-05-21 DIAGNOSIS — N179 Acute kidney failure, unspecified: Secondary | ICD-10-CM

## 2016-05-21 DIAGNOSIS — G20 Parkinson's disease: Secondary | ICD-10-CM | POA: Diagnosis not present

## 2016-05-21 DIAGNOSIS — R4182 Altered mental status, unspecified: Secondary | ICD-10-CM

## 2016-05-21 DIAGNOSIS — N182 Chronic kidney disease, stage 2 (mild): Secondary | ICD-10-CM | POA: Insufficient documentation

## 2016-05-21 DIAGNOSIS — F329 Major depressive disorder, single episode, unspecified: Secondary | ICD-10-CM | POA: Insufficient documentation

## 2016-05-21 DIAGNOSIS — E160 Drug-induced hypoglycemia without coma: Secondary | ICD-10-CM | POA: Diagnosis not present

## 2016-05-21 DIAGNOSIS — Z881 Allergy status to other antibiotic agents status: Secondary | ICD-10-CM | POA: Insufficient documentation

## 2016-05-21 DIAGNOSIS — I10 Essential (primary) hypertension: Secondary | ICD-10-CM | POA: Diagnosis present

## 2016-05-21 DIAGNOSIS — Z87891 Personal history of nicotine dependence: Secondary | ICD-10-CM | POA: Insufficient documentation

## 2016-05-21 DIAGNOSIS — Z885 Allergy status to narcotic agent status: Secondary | ICD-10-CM | POA: Insufficient documentation

## 2016-05-21 DIAGNOSIS — Z79811 Long term (current) use of aromatase inhibitors: Secondary | ICD-10-CM | POA: Diagnosis not present

## 2016-05-21 DIAGNOSIS — E1142 Type 2 diabetes mellitus with diabetic polyneuropathy: Secondary | ICD-10-CM | POA: Insufficient documentation

## 2016-05-21 DIAGNOSIS — M797 Fibromyalgia: Secondary | ICD-10-CM | POA: Diagnosis not present

## 2016-05-21 DIAGNOSIS — Z66 Do not resuscitate: Secondary | ICD-10-CM | POA: Diagnosis not present

## 2016-05-21 DIAGNOSIS — E1122 Type 2 diabetes mellitus with diabetic chronic kidney disease: Secondary | ICD-10-CM | POA: Diagnosis not present

## 2016-05-21 DIAGNOSIS — N390 Urinary tract infection, site not specified: Principal | ICD-10-CM

## 2016-05-21 DIAGNOSIS — F419 Anxiety disorder, unspecified: Secondary | ICD-10-CM | POA: Insufficient documentation

## 2016-05-21 DIAGNOSIS — J449 Chronic obstructive pulmonary disease, unspecified: Secondary | ICD-10-CM | POA: Diagnosis not present

## 2016-05-21 DIAGNOSIS — E11649 Type 2 diabetes mellitus with hypoglycemia without coma: Secondary | ICD-10-CM | POA: Diagnosis present

## 2016-05-21 DIAGNOSIS — E1143 Type 2 diabetes mellitus with diabetic autonomic (poly)neuropathy: Secondary | ICD-10-CM | POA: Insufficient documentation

## 2016-05-21 DIAGNOSIS — I129 Hypertensive chronic kidney disease with stage 1 through stage 4 chronic kidney disease, or unspecified chronic kidney disease: Secondary | ICD-10-CM | POA: Diagnosis not present

## 2016-05-21 DIAGNOSIS — G20A1 Parkinson's disease without dyskinesia, without mention of fluctuations: Secondary | ICD-10-CM | POA: Diagnosis present

## 2016-05-21 DIAGNOSIS — T383X5A Adverse effect of insulin and oral hypoglycemic [antidiabetic] drugs, initial encounter: Secondary | ICD-10-CM | POA: Diagnosis not present

## 2016-05-21 DIAGNOSIS — Z888 Allergy status to other drugs, medicaments and biological substances status: Secondary | ICD-10-CM | POA: Insufficient documentation

## 2016-05-21 DIAGNOSIS — Z7902 Long term (current) use of antithrombotics/antiplatelets: Secondary | ICD-10-CM | POA: Diagnosis not present

## 2016-05-21 DIAGNOSIS — E669 Obesity, unspecified: Secondary | ICD-10-CM | POA: Diagnosis not present

## 2016-05-21 DIAGNOSIS — Z8673 Personal history of transient ischemic attack (TIA), and cerebral infarction without residual deficits: Secondary | ICD-10-CM | POA: Insufficient documentation

## 2016-05-21 DIAGNOSIS — I69354 Hemiplegia and hemiparesis following cerebral infarction affecting left non-dominant side: Secondary | ICD-10-CM | POA: Insufficient documentation

## 2016-05-21 DIAGNOSIS — Z7951 Long term (current) use of inhaled steroids: Secondary | ICD-10-CM | POA: Insufficient documentation

## 2016-05-21 DIAGNOSIS — Z794 Long term (current) use of insulin: Secondary | ICD-10-CM | POA: Insufficient documentation

## 2016-05-21 DIAGNOSIS — F0280 Dementia in other diseases classified elsewhere without behavioral disturbance: Secondary | ICD-10-CM | POA: Diagnosis not present

## 2016-05-21 DIAGNOSIS — Z6834 Body mass index (BMI) 34.0-34.9, adult: Secondary | ICD-10-CM | POA: Diagnosis not present

## 2016-05-21 DIAGNOSIS — K3184 Gastroparesis: Secondary | ICD-10-CM | POA: Insufficient documentation

## 2016-05-21 DIAGNOSIS — Z853 Personal history of malignant neoplasm of breast: Secondary | ICD-10-CM | POA: Insufficient documentation

## 2016-05-21 DIAGNOSIS — Z79899 Other long term (current) drug therapy: Secondary | ICD-10-CM | POA: Insufficient documentation

## 2016-05-21 DIAGNOSIS — H547 Unspecified visual loss: Secondary | ICD-10-CM | POA: Insufficient documentation

## 2016-05-21 DIAGNOSIS — Z91041 Radiographic dye allergy status: Secondary | ICD-10-CM | POA: Insufficient documentation

## 2016-05-21 LAB — CBC WITH DIFFERENTIAL/PLATELET
Basophils Absolute: 0 10*3/uL (ref 0.0–0.1)
Basophils Relative: 0 %
EOS ABS: 0.1 10*3/uL (ref 0.0–0.7)
Eosinophils Relative: 1 %
HCT: 35.8 % — ABNORMAL LOW (ref 36.0–46.0)
HEMOGLOBIN: 11.9 g/dL — AB (ref 12.0–15.0)
LYMPHS ABS: 1 10*3/uL (ref 0.7–4.0)
LYMPHS PCT: 10 %
MCH: 31.7 pg (ref 26.0–34.0)
MCHC: 33.2 g/dL (ref 30.0–36.0)
MCV: 95.5 fL (ref 78.0–100.0)
Monocytes Absolute: 0.6 10*3/uL (ref 0.1–1.0)
Monocytes Relative: 6 %
Neutro Abs: 8.4 10*3/uL — ABNORMAL HIGH (ref 1.7–7.7)
Neutrophils Relative %: 83 %
Platelets: 213 10*3/uL (ref 150–400)
RBC: 3.75 MIL/uL — AB (ref 3.87–5.11)
RDW: 14.5 % (ref 11.5–15.5)
WBC: 10.1 10*3/uL (ref 4.0–10.5)

## 2016-05-21 LAB — COMPREHENSIVE METABOLIC PANEL
ALK PHOS: 106 U/L (ref 38–126)
ALT: 7 U/L — AB (ref 14–54)
AST: 20 U/L (ref 15–41)
Albumin: 3.8 g/dL (ref 3.5–5.0)
Anion gap: 10 (ref 5–15)
BUN: 38 mg/dL — AB (ref 6–20)
CALCIUM: 9.8 mg/dL (ref 8.9–10.3)
CO2: 27 mmol/L (ref 22–32)
CREATININE: 1.74 mg/dL — AB (ref 0.44–1.00)
Chloride: 104 mmol/L (ref 101–111)
GFR calc non Af Amer: 28 mL/min — ABNORMAL LOW (ref >60)
GFR, EST AFRICAN AMERICAN: 32 mL/min — AB (ref >60)
Glucose, Bld: 52 mg/dL — ABNORMAL LOW (ref 65–99)
Potassium: 4.4 mmol/L (ref 3.5–5.1)
SODIUM: 141 mmol/L (ref 135–145)
Total Bilirubin: 0.7 mg/dL (ref 0.3–1.2)
Total Protein: 7.2 g/dL (ref 6.5–8.1)

## 2016-05-21 LAB — URINALYSIS, ROUTINE W REFLEX MICROSCOPIC
Bilirubin Urine: NEGATIVE
Glucose, UA: NEGATIVE mg/dL
KETONES UR: NEGATIVE mg/dL
NITRITE: POSITIVE — AB
PROTEIN: 100 mg/dL — AB
Specific Gravity, Urine: 1.013 (ref 1.005–1.030)
pH: 6 (ref 5.0–8.0)

## 2016-05-21 LAB — I-STAT TROPONIN, ED: TROPONIN I, POC: 0.06 ng/mL (ref 0.00–0.08)

## 2016-05-21 LAB — GLUCOSE, CAPILLARY: GLUCOSE-CAPILLARY: 107 mg/dL — AB (ref 65–99)

## 2016-05-21 LAB — I-STAT CG4 LACTIC ACID, ED: LACTIC ACID, VENOUS: 2.18 mmol/L — AB (ref 0.5–1.9)

## 2016-05-21 MED ORDER — ANASTROZOLE 1 MG PO TABS
1.0000 mg | ORAL_TABLET | Freq: Every day | ORAL | Status: DC
  Administered 2016-05-22 – 2016-05-23 (×2): 1 mg via ORAL
  Filled 2016-05-21 (×2): qty 1

## 2016-05-21 MED ORDER — IPRATROPIUM-ALBUTEROL 0.5-2.5 (3) MG/3ML IN SOLN: 3.0000 mL | Freq: Four times a day (QID) | RESPIRATORY_TRACT | Status: DC | PRN

## 2016-05-21 MED ORDER — DSS 100 MG PO CAPS: 100.0000 mg | ORAL_CAPSULE | Freq: Two times a day (BID) | ORAL | Status: DC

## 2016-05-21 MED ORDER — LORATADINE 10 MG PO TABS
10.0000 mg | ORAL_TABLET | Freq: Every day | ORAL | Status: DC
  Administered 2016-05-22 – 2016-05-23 (×2): 10 mg via ORAL
  Filled 2016-05-21 (×2): qty 1

## 2016-05-21 MED ORDER — DEXTROSE 5 % IV SOLN: 1.0000 g | Freq: Once | INTRAVENOUS | Status: DC

## 2016-05-21 MED ORDER — TIZANIDINE HCL 4 MG PO TABS
2.0000 mg | ORAL_TABLET | Freq: Two times a day (BID) | ORAL | Status: DC
  Administered 2016-05-21 – 2016-05-23 (×4): 2 mg via ORAL
  Filled 2016-05-21 (×4): qty 1

## 2016-05-21 MED ORDER — TIZANIDINE HCL 4 MG PO TABS
2.0000 mg | ORAL_TABLET | Freq: Three times a day (TID) | ORAL | Status: DC | PRN
Start: 2016-05-21 — End: 2016-05-23

## 2016-05-21 MED ORDER — DULOXETINE HCL 60 MG PO CPEP
60.0000 mg | ORAL_CAPSULE | Freq: Two times a day (BID) | ORAL | Status: DC
  Administered 2016-05-21 – 2016-05-23 (×4): 60 mg via ORAL
  Filled 2016-05-21 (×4): qty 1

## 2016-05-21 MED ORDER — ACETAMINOPHEN 650 MG RE SUPP: 650.0000 mg | Freq: Four times a day (QID) | RECTAL | Status: DC | PRN

## 2016-05-21 MED ORDER — LISINOPRIL 5 MG PO TABS
5.0000 mg | ORAL_TABLET | Freq: Two times a day (BID) | ORAL | Status: DC
Start: 2016-05-21 — End: 2016-05-23
  Administered 2016-05-21 – 2016-05-23 (×4): 5 mg via ORAL
  Filled 2016-05-21 (×4): qty 1

## 2016-05-21 MED ORDER — POLYETHYLENE GLYCOL 3350 17 G PO PACK
17.0000 g | PACK | Freq: Every day | ORAL | Status: DC
  Administered 2016-05-22: 17 g via ORAL
  Filled 2016-05-21: qty 1

## 2016-05-21 MED ORDER — QUETIAPINE FUMARATE 25 MG PO TABS
25.0000 mg | ORAL_TABLET | Freq: Two times a day (BID) | ORAL | Status: DC
  Administered 2016-05-21 – 2016-05-23 (×4): 25 mg via ORAL
  Filled 2016-05-21 (×4): qty 1

## 2016-05-21 MED ORDER — POLYETHYLENE GLYCOL 3350 17 GM/SCOOP PO POWD
17.0000 g | Freq: Every day | ORAL | Status: DC
  Filled 2016-05-21: qty 255

## 2016-05-21 MED ORDER — MENTHOL (TOPICAL ANALGESIC) 4 % EX GEL: 1.0000 "application " | Freq: Four times a day (QID) | CUTANEOUS | Status: DC | PRN

## 2016-05-21 MED ORDER — SODIUM CHLORIDE 0.9 % IV BOLUS (SEPSIS)
1000.0000 mL | Freq: Once | INTRAVENOUS | Status: AC
Start: 2016-05-21 — End: 2016-05-21
  Administered 2016-05-21: 1000 mL via INTRAVENOUS

## 2016-05-21 MED ORDER — PHENOL 1.4 % MT LIQD: 1.0000 | OROMUCOSAL | Status: DC | PRN

## 2016-05-21 MED ORDER — MAGNESIUM HYDROXIDE 400 MG/5ML PO SUSP: 30.0000 mL | Freq: Every day | ORAL | Status: DC | PRN

## 2016-05-21 MED ORDER — PANTOPRAZOLE SODIUM 40 MG PO TBEC
40.0000 mg | DELAYED_RELEASE_TABLET | Freq: Every day | ORAL | Status: DC
  Administered 2016-05-22 – 2016-05-23 (×2): 40 mg via ORAL
  Filled 2016-05-21 (×2): qty 1

## 2016-05-21 MED ORDER — GABAPENTIN 600 MG PO TABS
600.0000 mg | ORAL_TABLET | Freq: Two times a day (BID) | ORAL | Status: DC
  Administered 2016-05-21 – 2016-05-23 (×4): 600 mg via ORAL
  Filled 2016-05-21 (×4): qty 1

## 2016-05-21 MED ORDER — RIVASTIGMINE TARTRATE 1.5 MG PO CAPS
1.5000 mg | ORAL_CAPSULE | Freq: Two times a day (BID) | ORAL | Status: DC
  Administered 2016-05-21 – 2016-05-23 (×4): 1.5 mg via ORAL
  Filled 2016-05-21 (×5): qty 1

## 2016-05-21 MED ORDER — INSULIN ASPART 100 UNIT/ML ~~LOC~~ SOLN: 0.0000 [IU] | Freq: Every day | SUBCUTANEOUS | Status: DC

## 2016-05-21 MED ORDER — PREDNISOLONE ACETATE 1 % OP SUSP
1.0000 [drp] | Freq: Two times a day (BID) | OPHTHALMIC | Status: DC
  Administered 2016-05-21 – 2016-05-23 (×4): 1 [drp] via OPHTHALMIC
  Filled 2016-05-21 (×2): qty 1

## 2016-05-21 MED ORDER — CALCIUM CARBONATE ANTACID 500 MG PO CHEW
1.0000 | CHEWABLE_TABLET | Freq: Three times a day (TID) | ORAL | Status: DC
  Administered 2016-05-21 – 2016-05-23 (×5): 200 mg via ORAL
  Filled 2016-05-21 (×5): qty 1

## 2016-05-21 MED ORDER — SODIUM CHLORIDE 0.9 % IV SOLN
INTRAVENOUS | Status: DC
  Administered 2016-05-21 – 2016-05-22 (×2): via INTRAVENOUS

## 2016-05-21 MED ORDER — CARBIDOPA-LEVODOPA 25-100 MG PO TABS
1.0000 | ORAL_TABLET | Freq: Three times a day (TID) | ORAL | Status: DC
  Administered 2016-05-21 – 2016-05-23 (×5): 1 via ORAL
  Filled 2016-05-21 (×5): qty 1

## 2016-05-21 MED ORDER — TRAMADOL HCL 50 MG PO TABS
25.0000 mg | ORAL_TABLET | Freq: Four times a day (QID) | ORAL | Status: DC | PRN
Start: 2016-05-21 — End: 2016-05-23
  Administered 2016-05-21 – 2016-05-22 (×2): 25 mg via ORAL
  Filled 2016-05-21 (×2): qty 1

## 2016-05-21 MED ORDER — HEPARIN SODIUM (PORCINE) 5000 UNIT/ML IJ SOLN
5000.0000 [IU] | Freq: Three times a day (TID) | INTRAMUSCULAR | Status: DC
  Administered 2016-05-21 – 2016-05-23 (×5): 5000 [IU] via SUBCUTANEOUS
  Filled 2016-05-21 (×5): qty 1

## 2016-05-21 MED ORDER — ATROPINE SULFATE 1 % OP SOLN
1.0000 [drp] | Freq: Two times a day (BID) | OPHTHALMIC | Status: DC
  Administered 2016-05-21 – 2016-05-23 (×4): 1 [drp] via OPHTHALMIC
  Filled 2016-05-21 (×2): qty 2

## 2016-05-21 MED ORDER — BISACODYL 10 MG RE SUPP
10.0000 mg | Freq: Once | RECTAL | Status: AC
  Administered 2016-05-21: 10 mg via RECTAL
  Filled 2016-05-21: qty 1

## 2016-05-21 MED ORDER — PHENYLEPH-SHARK LIV OIL-MO-PET 0.25-3-14-71.9 % RE OINT: 1.0000 "application " | TOPICAL_OINTMENT | Freq: Two times a day (BID) | RECTAL | Status: DC | PRN

## 2016-05-21 MED ORDER — ACETAMINOPHEN 325 MG PO TABS: 650.0000 mg | ORAL_TABLET | Freq: Four times a day (QID) | ORAL | Status: DC | PRN

## 2016-05-21 MED ORDER — CLOPIDOGREL BISULFATE 75 MG PO TABS
75.0000 mg | ORAL_TABLET | Freq: Every day | ORAL | Status: DC
  Administered 2016-05-22 – 2016-05-23 (×2): 75 mg via ORAL
  Filled 2016-05-21 (×2): qty 1

## 2016-05-21 MED ORDER — METOPROLOL SUCCINATE ER 50 MG PO TB24
50.0000 mg | ORAL_TABLET | Freq: Every day | ORAL | Status: DC
  Administered 2016-05-22 – 2016-05-23 (×2): 50 mg via ORAL
  Filled 2016-05-21 (×2): qty 1

## 2016-05-21 MED ORDER — CEFTRIAXONE SODIUM 1 G IJ SOLR
1.0000 g | INTRAMUSCULAR | Status: DC
  Administered 2016-05-21 – 2016-05-22 (×2): 1 g via INTRAVENOUS
  Filled 2016-05-21 (×2): qty 10

## 2016-05-21 MED ORDER — FLUTICASONE PROPIONATE 50 MCG/ACT NA SUSP
2.0000 | Freq: Every day | NASAL | Status: DC | PRN
  Filled 2016-05-21: qty 16

## 2016-05-21 MED ORDER — INSULIN ASPART 100 UNIT/ML ~~LOC~~ SOLN
0.0000 [IU] | Freq: Three times a day (TID) | SUBCUTANEOUS | Status: DC
  Administered 2016-05-22 – 2016-05-23 (×3): 1 [IU] via SUBCUTANEOUS

## 2016-05-21 NOTE — ED Notes (Signed)
Patient transported to CT 

## 2016-05-21 NOTE — ED Triage Notes (Signed)
Pt BIB [facility] from [facility]. Pt reported to EMS that staff was difficult to arouse this morning and more agitated than normal once awake. Per EMS pt swinging right arm and hitting stretcher. A&OX1 at baseline per facility.

## 2016-05-21 NOTE — ED Notes (Addendum)
Family at bedside attempting to comfort pt. Family reports that pt behaviors of yelling and hitting her right hand on bed are at baseline for pt.

## 2016-05-21 NOTE — ED Provider Notes (Signed)
[facility] DEPT Provider Note   CSN: [id number] Arrival date & time: 05/21/16  1121     History   Chief Complaint Chief Complaint  Patient presents with  . Altered Mental Status    HPI Katherine Carr is a 76 y.o. female.  HPI   76 year old female presents from [facility] facility for concern of altered mental status. Ice has been reports that he was there, and he witnessed an episode lasting a few minutes of full body shaking, with patient's eyes rolled back in her head. Reports that she is appeared sleepier than usual.  This feels silly reports that they found patient unresponsive, with her not awakening to sternal rub, and her having hypoxia at that time to the 80s, and place her on 15 L of nonrebreather. They reportedly lasted roughly 5 minutes and then she returned to her baseline.  They deny fevers, prior shortness of breath or other concerns. Patients only concern of my history is abdominal pain. Points to the periumbilical area and right lower quadrant. Denies nausea or vomiting.  Patient answers questions intermittently, becomes agitated, hitting her right hand against the bed rail repeatedly.  Family reports that some of her behaviors are baseline however worse. Report she is DNR.  Past Medical History:  Diagnosis Date  . Anxiety   . Asthma    bronchial  . Breast cancer ([place])    "both sides" (10/29/2012)  . COPD (chronic obstructive pulmonary disease) ([place])   . Depression   . Diabetes mellitus   . Diverticulosis   . Fatty liver    hx: of  . Fibromyalgia   . Fibrous breast lumps   . GERD (gastroesophageal reflux disease)   . H/O hiatal hernia    Archie Endo 09/12/2007  (10/29/2012)  . Herpes   . Hypertension   . Left carotid stenosis    Hx: of  . Migraines    Archie Endo 09/01/2001 (10/29/2012)  . Osteoarthritis    Archie Endo 09/12/2007  (10/29/2012)  . Ovarian cyst   . Peripheral neuropathy   . Urinary incontinence   . Wears glasses     Patient Active  Problem List   Diagnosis Date Noted  . UTI (urinary tract infection) 05/21/2016  . AKI (acute kidney injury) ([place]) 05/21/2016  . Hypoglycemia due to insulin 05/21/2016  . Acute cystitis with hematuria   . Tachycardia   . Hematemesis 01/21/2016  . Upper GI bleed 01/21/2016  . Lower urinary tract infectious disease 02/05/2013  . Neuropathy 02/05/2013  . HTN (hypertension), malignant 02/05/2013  . Parkinson disease ([place]) 02/05/2013  . Fever 02/05/2013  . Acute encephalopathy 10/29/2012  . Elevated LFTs 10/29/2012  . Common bile duct dilatation 10/29/2012  . Primary cancer of upper inner quadrant of right female breast ([place]) 05/19/2012  . Carcinoma in situ of breast 04/11/2012  . Intraductal papilloma of breast 03/17/2012  . ANXIETY DEPRESSION 03/24/2008  . ABDOMINAL PAIN-MULTIPLE SITES 02/17/2008  . LIVER FUNCTION TESTS, ABNORMAL, HX OF 02/12/2008  . GASTROPARESIS 09/24/2007  . BACK PAIN 09/24/2007  . Chest pain, unspecified 09/24/2007  . IRRITABLE BOWEL SYNDROME 09/09/2007  . DYSPHAGIA 09/09/2007  . Abdominal pain, epigastric 09/09/2007  . ABDOMINAL PAIN-GENERALIZED 09/09/2007  . URI 07/08/2007  . CLAUDICATION, INTERMITTENT 04/21/2007  . FATIGUE 04/21/2007  . EDEMA 04/21/2007  . OBESITY 02/26/2007  . DEPRESSION 02/26/2007  . MIGRAINE HEADACHE 02/26/2007  . ALLERGIC RHINITIS 02/26/2007  . ASTHMA 02/26/2007  . GERD 02/26/2007  . DEGENERATIVE DISC DISEASE 02/26/2007  . FIBROMYALGIA 02/26/2007  .  RHEUMATIC FEVER, HX OF 02/26/2007  . ACUTE BRONCHITIS 02/21/2007  . HYPERLIPIDEMIA 01/31/2007  . LIVER FUNCTION TESTS, ABNORMAL 01/10/2007  . IBD 09/17/2006  . Type 2 diabetes mellitus with hypoglycemia without coma ([place]) 09/04/2006  . HYPERLIPIDEMIA 09/04/2006  . COMMON MIGRAINE 09/04/2006  . Essential hypertension 09/04/2006  . OSTEOARTHRITIS 09/04/2006  . SPONDYLOSIS, CERVICAL 09/04/2006  . CHEST PAIN, HX OF 09/04/2006  . ESOPHAGITIS 09/05/2004  . HIATAL HERNIA 09/05/2004    . HEMORRHOIDS, INTERNAL 02/18/2001  . GASTRITIS, CHRONIC 02/18/2001  . Diverticulosis of colon (without mention of hemorrhage) 02/18/2001    Past Surgical History:  Procedure Laterality Date  . ABDOMINAL HYSTERECTOMY    . ANTERIOR CERVICAL DECOMP/DISCECTOMY FUSION  09/2003   Archie Endo 09/25/2003 (10/29/2012)  . APPENDECTOMY    . BACK SURGERY     central decompression L5-S1/notes 10/23/2005 (10/29/2012)  . BREAST DUCTAL SYSTEM EXCISION Left    Archie Endo 04/02/2012  (10/29/2012)  . BREAST LUMPECTOMY Right 04/21/2012   Procedure: Right breast LUMPECTOMY with re-excision of margins;  Surgeon: Adin Hector, MD;  Location: [facility];  Service: General;  Laterality: Right;  . BREAST LUMPECTOMY WITH NEEDLE LOCALIZATION Right 04/02/2012   Procedure: Excision Ductal System Right Breast with Needle Localization;  Surgeon: Adin Hector, MD;  Location: [facility];  Service: General;  Laterality: Right;  needle localization at 7:30 at [facility]   . CARPAL TUNNEL RELEASE Bilateral   . CESAREAN SECTION    . ESOPHAGOGASTRODUODENOSCOPY N/A 01/23/2016   Procedure: ESOPHAGOGASTRODUODENOSCOPY (EGD);  Surgeon: Arta Silence, MD;  Location: [facility];  Service: Endoscopy;  Laterality: N/A;  . MYOMECTOMY    . NEVUS EXCISION Left 04/02/2012   Procedure: Excision Nevus Left Abdominal Wall;  Surgeon: Adin Hector, MD;  Location: [facility];  Service: General;  Laterality: Left;  . TONSILLECTOMY      OB History    Gravida Para Term Preterm AB Living   1 1       1    SAB TAB Ectopic Multiple Live Births                   Home Medications    Prior to Admission medications   Medication Sig Start Date End Date Taking? Authorizing Provider  acetaminophen (TYLENOL) 325 MG tablet Take 325 mg by mouth every 4 (four) hours as needed for moderate pain.    Yes [provider]  anastrozole (ARIMIDEX) 1 MG tablet Take 1 mg by mouth daily.   Yes [provider]  atropine 1 %  ophthalmic solution Place 1 drop into both eyes twice daily   Yes [provider]  calcium carbonate (TUMS - DOSED IN MG ELEMENTAL CALCIUM) 500 MG chewable tablet Chew 1 tablet by mouth 3 (three) times daily.   Yes [provider]  carbidopa-levodopa (SINEMET) 25-100 MG tablet Take 1/2 tab twice daily x 3 days, then 1/2 tab three times daily x 4 days, then 1 tab three times daily Patient taking differently: Take 1 tablet by mouth 3 (three) times daily.  11/24/14  Yes Jaffe, Adam R, DO  Cholecalciferol (VITAMIN D3) 1000 UNITS CAPS Take 2,000 Units by mouth daily.   Yes [provider]  clopidogrel (PLAVIX) 75 MG tablet Take 75 mg by mouth daily.   Yes [provider]  Cranberry 450 MG TABS Take 450 mg by mouth 2 (two) times daily.    Yes [provider]  diclofenac sodium (VOLTAREN) 1 % GEL Apply 4 g  topically 3 (three) times daily as needed (joint pain).   Yes [provider]  docusate sodium 100 MG CAPS Take 100 mg by mouth 2 (two) times daily. 02/06/13  Yes York, Marianne L, PA-C  DULoxetine (CYMBALTA) 60 MG capsule Take 60 mg by mouth 2 (two) times daily.   Yes [provider]  fluticasone (FLONASE) 50 MCG/ACT nasal spray Place 2 sprays into both nostrils daily as needed for rhinitis or allergies. 02/06/13  Yes York, Marianne L, PA-C  furosemide (LASIX) 20 MG tablet Take 20 mg by mouth daily.  12/29/15  Yes [provider]  gabapentin (NEURONTIN) 600 MG tablet Take 600 mg by mouth 2 (two) times daily.    Yes [provider]  insulin detemir (LEVEMIR) 100 unit/ml SOLN Inject 50-100 Units into the skin 2 (two) times daily. Takes 100 units in the morning and 50 units at bedtime   Yes [provider]  insulin lispro (HUMALOG) 100 UNIT/ML injection Inject 60-80 Units into the skin 3 (three) times daily with meals. Inject 80 units at 8AM and noon and inject 60 units at supper time   Yes [provider]    ipratropium-albuterol (DUONEB) 0.5-2.5 (3) MG/3ML SOLN Take 3 mLs by nebulization every 6 (six) hours as needed (shortness of breath). 02/06/13  Yes York, Marianne L, PA-C  lisinopril (PRINIVIL,ZESTRIL) 5 MG tablet Take 5 mg by mouth 2 (two) times daily.  01/10/16  Yes [provider]  loratadine (CLARITIN) 10 MG tablet Take 10 mg by mouth daily.   Yes [provider]  magnesium hydroxide (MILK OF MAGNESIA) 400 MG/5ML suspension Take 30 mLs by mouth daily as needed for mild constipation.   Yes [provider]  Melatonin 3 MG TABS Take 3 mg by mouth at bedtime.   Yes [provider]  Menthol, Topical Analgesic, (BIOFREEZE) 4 % GEL Apply 1 application topically 2 (two) times daily. Apply to buttock's   Yes [provider]  Menthol, Topical Analgesic, (BIOFREEZE) 4 % GEL Apply 1 application topically 4 (four) times daily as needed (for pain).   Yes [provider]  metoprolol succinate (TOPROL-XL) 50 MG 24 hr tablet Take 50 mg by mouth daily.  01/06/16  Yes [provider]  pantoprazole (PROTONIX) 40 MG tablet Take 1 tablet (40 mg total) by mouth 2 (two) times daily. Patient taking differently: Take 40 mg by mouth daily.  01/24/16  Yes Sheikh, Omair Latif, DO  phenol (SORE THROAT SPRAY) 1.4 % LIQD Use as directed 1 spray in the mouth or throat every 4 (four) hours as needed for throat irritation / pain.   Yes [provider]  phenylephrine-shark liver oil-mineral oil-petrolatum (PREPARATION H) 0.25-3-14-71.9 % rectal ointment Place 1 application rectally 2 (two) times daily as needed for hemorrhoids.   Yes [provider]  polyethylene glycol powder (GLYCOLAX/MIRALAX) powder Take 17 g by mouth daily.  01/06/16  Yes [provider]  potassium chloride (K-DUR,KLOR-CON) 10 MEQ tablet Take 10 mEq by mouth daily.  12/09/15  Yes [provider]  pravastatin (PRAVACHOL) 20 MG tablet Take 20 mg by mouth daily.  12/27/15   Yes [provider]  prednisoLONE acetate (PRED FORTE) 1 % ophthalmic suspension Place 1 drop into both eyes 2 (two) times daily.  01/10/16  Yes [provider]  QUEtiapine (SEROQUEL) 25 MG tablet Take 25 mg by mouth 2 (two) times daily.   Yes [provider]  rivastigmine (EXELON) 1.5 MG capsule Take 1.5 mg  by mouth 2 (two) times daily.  01/01/16  Yes [provider]  Sennosides-Docusate Sodium (SENNA S PO) Take 1 tablet by mouth 2 (two) times daily.    Yes [provider]  tiZANidine (ZANAFLEX) 2 MG tablet Take 2 mg by mouth 2 (two) times daily.  01/10/16  Yes [provider]  tiZANidine (ZANAFLEX) 2 MG tablet Take 2 mg by mouth every 8 (eight) hours as needed for muscle spasms.   Yes [provider]  traMADol (ULTRAM) 50 MG tablet Take 0.5 tablets (25 mg total) by mouth every 6 (six) hours as needed for moderate pain. 01/24/16  Yes Sheikh, Omair Latif, DO  ALPRAZolam Duanne Moron) 0.5 MG tablet Take 1 tablet (0.5 mg total) by mouth at bedtime as needed for anxiety. Patient not taking: Reported on 03/29/2016 01/24/16   Raiford Noble Latif, DO  cefpodoxime (VANTIN) 100 MG tablet Take 1 tablet (100 mg total) by mouth every 12 (twelve) hours. Patient not taking: Reported on 03/29/2016 01/25/16   Kerney Elbe, DO    Family History Family History  Problem Relation Age of Onset  . Heart disease Mother   . Heart disease Brother   . Diabetes Brother   . Arthritis Sister   . Stroke Sister   . Cirrhosis Brother   . Cancer Brother        Bladder cancer    Social History Social History  Substance Use Topics  . Smoking status: Former Smoker    Years: 20.00    Types: Cigarettes    Quit date: 08/15/1996  . Smokeless tobacco: Never Used     Comment: smoked 2-3 cigarettes/day  . Alcohol use No     Allergies   Sertraline hcl; Accolate [zafirlukast]; Avandia [rosiglitazone]; Codeine; Cyclobenzaprine; Furosemide; Gadolinium derivatives;  Hydrocodone-acetaminophen; Iohexol; Levofloxacin; Lorazepam; Lubiprostone; Metformin; Metoclopramide; Oxycodone; Promethazine hcl; Verapamil; Fentanyl; and Morphine   Review of Systems Review of Systems  Unable to perform ROS: Dementia  Constitutional: Negative for fever.  Respiratory: Negative for cough and shortness of breath.   Cardiovascular: Negative for chest pain.  Gastrointestinal: Positive for abdominal pain. Negative for nausea and vomiting.  Genitourinary: Negative for difficulty urinating.  Skin: Negative for rash.  Neurological: Negative for syncope and headaches.     Physical Exam Updated Vital Signs BP 137/64   Pulse (!) 57   Temp 98.1 F (36.7 C) (Axillary)   Resp 16   Ht 5\' 4"  (1.626 m)   Wt 90.3 kg (199 lb)   SpO2 100%   BMI 34.16 kg/m   Physical Exam  Constitutional: She appears well-developed and well-nourished. No distress.  HENT:  Head: Normocephalic and atraumatic.  Eyes: Conjunctivae and EOM are normal. Right pupil is not reactive. Left pupil is not reactive.  Neck: Normal range of motion.  Cardiovascular: Normal rate, regular rhythm, normal heart sounds and intact distal pulses.  Exam reveals no gallop and no friction rub.   No murmur heard. Pulmonary/Chest: Effort normal and breath sounds normal. No respiratory distress. She has no wheezes. She has no rales.  Abdominal: Soft. She exhibits no distension. There is no tenderness. There is no guarding.  Musculoskeletal: She exhibits no edema or tenderness.  Neurological: She is alert. No sensory deficit (denies).  Oriented to self Left upper extremity spastic paraplegia Moves both legs spontaneously and to pain but does not participate in strength testing Full strength RUE  Pupils dilated, nonreactive bilaterally, movement appears normal    Skin: Skin is warm and dry. No rash  noted. She is not diaphoretic. No erythema.  Nursing note and vitals reviewed.    ED Treatments / Results   Labs (all labs ordered are listed, but only abnormal results are displayed) Labs Reviewed  CBC WITH DIFFERENTIAL/PLATELET - Abnormal; Notable for the following:       Result Value   RBC 3.75 (*)    Hemoglobin 11.9 (*)    HCT 35.8 (*)    Neutro Abs 8.4 (*)    All other components within normal limits  COMPREHENSIVE METABOLIC PANEL - Abnormal; Notable for the following:    Glucose, Bld 52 (*)    BUN 38 (*)    Creatinine, Ser 1.74 (*)    ALT 7 (*)    GFR calc non Af Amer 28 (*)    GFR calc Af Amer 32 (*)    All other components within normal limits  URINALYSIS, ROUTINE W REFLEX MICROSCOPIC - Abnormal; Notable for the following:    APPearance HAZY (*)    Hgb urine dipstick SMALL (*)    Protein, ur 100 (*)    Nitrite POSITIVE (*)    Leukocytes, UA SMALL (*)    Bacteria, UA MANY (*)    Squamous Epithelial / LPF 0-5 (*)    All other components within normal limits  CULTURE, BLOOD (ROUTINE X 2)  CULTURE, BLOOD (ROUTINE X 2)  URINE CULTURE  I-STAT TROPOININ, ED  I-STAT CG4 LACTIC ACID, ED    EKG  EKG Interpretation  Date/Time:  Monday May 21 2016 11:26:52 EDT Ventricular Rate:  77 PR Interval:    QRS Duration: 87 QT Interval:  398 QTC Calculation: 451 R Axis:   2 Text Interpretation:  Sinus rhythm Low voltage, precordial leads No significant change since last tracing Confirmed by [facility] MD, Junie Panning ([id number]) on 05/21/2016 12:01:54 PM Also confirmed by [facility] MD, Peirce Deveney ([id number]), editor Drema Pry [phone])  on 05/21/2016 1:58:08 PM       Radiology Ct Abdomen Pelvis Wo Contrast  Result Date: 05/21/2016 CLINICAL DATA:  Abdominal pain.  Right lower quadrant. EXAM: CT ABDOMEN AND PELVIS WITHOUT CONTRAST TECHNIQUE: Multidetector CT imaging of the abdomen and pelvis was performed following the standard protocol without IV contrast. COMPARISON:  01/13/2009 FINDINGS: Lower chest: Scar identified within the left lower lobe. No pleural effusions. Hepatobiliary: No suspicious  liver abnormality. Stones noted within the dependent portion of the gallbladder measure up to 5 mm. No biliary dilatation. Pancreas: Again noted are several small calcifications in the head of pancreas. No pancreatic ductal dilatation or surrounding inflammatory changes. Spleen: Upper limits of normal in length measuring 12 cm. No focal splenic abnormality identified within the limitations of unenhanced technique. Adrenals/Urinary Tract: Normal appearance of the adrenal glands. No nephrolithiasis or hydronephrosis. Urinary bladder appears normal. Stomach/Bowel: The stomach is normal. No dilated loops of small or large bowel. Unremarkable appearance of the colon. The appendix is difficult to identified separate from the right lower quadrant bowel loops. No secondary signs of acute appendicitis. Vascular/Lymphatic: Aortic atherosclerosis. No aneurysm. There is no upper abdominal adenopathy. No pelvic or inguinal adenopathy identified. Reproductive: Status post hysterectomy. No adnexal masses. Other: No abdominal wall hernia or abnormality. No abdominopelvic ascites. Musculoskeletal: Anterolisthesis of L4 on L5 noted. There is degenerative disc disease identified at L5-S1. Status post L5 laminectomy. IMPRESSION: 1. No acute findings identified within the abdomen or pelvis. Nonvisualization of the appendix. 2.  Aortic Atherosclerosis (ICD10-I70.0). 3. Gallstones 4. Lumbar spondylosis. Electronically Signed   By: Queen Slough.D.  On: 05/21/2016 15:03   Ct Head Wo Contrast  Result Date: 05/21/2016 CLINICAL DATA:  Agitation, altered mental status and seizure like activity today. Abdominal pain. History of diabetes, hypertension, breast cancer, migraines. EXAM: CT HEAD WITHOUT CONTRAST TECHNIQUE: Contiguous axial images were obtained from the base of the skull through the vertex without intravenous contrast. COMPARISON:  CT HEAD November 11, 2015 FINDINGS: BRAIN: No intraparenchymal hemorrhage, mass effect nor  midline shift. The ventricles and sulci are normal for age. Patchy supratentorial white matter hypodensities within normal range for patient's age, though non-specific are most compatible with chronic small vessel ischemic disease. Old basal ganglia and RIGHT thalamus lacunar infarcts. No acute large vascular territory infarcts. No abnormal extra-axial fluid collections. Basal cisterns are patent. VASCULAR: Mild to moderate calcific atherosclerosis of the carotid siphons. SKULL: No skull fracture. Severe temporomandibular osteoarthrosis. No significant scalp soft tissue swelling. SINUSES/ORBITS: Trace LEFT mastoid effusion. Paranasal sinus are well aerated. The included ocular globes and orbital contents are non-suspicious. Status post bilateral ocular lens implants. OTHER: None. IMPRESSION: No acute intracranial process. Stable examination including old lacunar infarcts and moderate chronic small vessel ischemic disease. Electronically Signed   By: Elon Alas M.D.   On: 05/21/2016 14:56    Procedures Procedures (including critical care time)  Medications Ordered in ED Medications  cefTRIAXone (ROCEPHIN) 1 g in dextrose 5 % 50 mL IVPB (not administered)  sodium chloride 0.9 % bolus 1,000 mL (0 mLs Intravenous Stopped 05/21/16 1415)     Initial Impression / Assessment and Plan / ED Course  I have reviewed the triage vital signs and the nursing notes.  Pertinent labs & imaging results that were available during my care of the patient were reviewed by me and considered in my medical decision making (see chart for details).     76 year old female with a history of CVA with left-sided weakness, hypertension, diabetes, parkinsonism, dementia who presents with concern for altered mental status. History obtained from thisis most consistent with altered mental status and syncopal episode, her episode of unresponsiveness, well ex husband describes an episode most consistent with seizure-like  activity. They report that patient appears to be sleepier than her baseline at this time.  Patient also reports abdominal pain on my history ane exam.  CT head done given question of seizure-like activity and showed no acute findings. CT abdomen shows no acute findings.  Glucose 52, pt interactive, able to take po. Trop negative. Neurologic exam appears unchanged from baseline. CMP shows mild AKI. Urinalysis consistent with urinary tract infection. Feel this is most likely etiology of her worsening mental status.  ?First time seizure however one episode and will not initiate antiepileptics at this time.   Will admit for altered mental status, UTI, mild AKI. Given fluid and rocephin.  Final Clinical Impressions(s) / ED Diagnoses   Final diagnoses:  Urinary tract infection without hematuria, site unspecified  AKI (acute kidney injury) ([place])  Altered mental status, unspecified altered mental status type    New Prescriptions New Prescriptions   No medications on file     Gareth Morgan, MD 05/21/16 1840

## 2016-05-21 NOTE — ED Notes (Signed)
Pt transported to floor 

## 2016-05-21 NOTE — ED Notes (Signed)
Placed seizure pads on bed rails for pt protection. Pt hitting hitting side of bed/pads and punching bed screaming "help me" and "I got to get up" When asking pt what is wrong she states "I don't know". Pt denies pain.

## 2016-05-21 NOTE — H&P (Signed)
History and Physical    Katherine Carr OFB:[id number] DOB: 1940/01/29 DOA: 05/21/2016  Referring MD/NP/PA: er PCP: Wenda Low, MD Outpatient Specialists:  Patient coming from: nursing home- long term resident  Chief Complaint: hard to wake up this AM with "seizure" like movements  HPI: Katherine Carr is a 76 y.o. female with medical history significant of DM, CVA, blindness who is a long term resident of [facility] (8 years).  Family reports a steady decline in appetite over last few weeks.  She is blind and has to be fed her meals.  This AM staff reported that patient was difficult to arouse and more agitated than normal.  Family saw her this AM and she was her normal self but c/o shaking, knew who he was (husband).  Husband went to bathroom and when he returned, she was shaking all over.    In the ER, her glucose was found to be 52.  Urine was consistent with a UTI.  Otherwise labs were unremarkable.     Review of Systems: all systems reviewed, negative unless stated above in HPI   Past Medical History:  Diagnosis Date  . Anxiety   . Asthma    bronchial  . Breast cancer ([place])    "both sides" (10/29/2012)  . COPD (chronic obstructive pulmonary disease) ([place])   . Depression   . Diabetes mellitus   . Diverticulosis   . Fatty liver    hx: of  . Fibromyalgia   . Fibrous breast lumps   . GERD (gastroesophageal reflux disease)   . H/O hiatal hernia    Archie Endo 09/12/2007  (10/29/2012)  . Herpes   . Hypertension   . Left carotid stenosis    Hx: of  . Migraines    Archie Endo 09/01/2001 (10/29/2012)  . Osteoarthritis    Archie Endo 09/12/2007  (10/29/2012)  . Ovarian cyst   . Peripheral neuropathy   . Urinary incontinence   . Wears glasses     Past Surgical History:  Procedure Laterality Date  . ABDOMINAL HYSTERECTOMY    . ANTERIOR CERVICAL DECOMP/DISCECTOMY FUSION  09/2003   Ar[facility] Endo 09/25/2003 (10/29/2012)  . APPENDECTOMY    . BACK SURGERY     central decompression  L5-S1/notes 10/23/2005 (10/29/2012)  . BREAST DUCTAL SYSTEM EXCISION Left    Ar[facility] Endo 04/02/2012  (10/29/2012)  . BREAST LUMPECTOMY Right 04/21/2012   Procedure: Right breast LUMPECTOMY with re-excision of margins;  Surgeon: Adin Hector, MD;  Location: [facility];  Service: General;  Laterality: Right;  . BREAST LUMPECTOMY WITH NEEDLE LOCALIZATION Right 04/02/2012   Procedure: Excision Ductal System Right Breast with Needle Localization;  Surgeon: Adin Hector, MD;  Location: [facility];  Service: General;  Laterality: Right;  needle localization at 7:30 at [facility]   . CARPAL TUNNEL RELEASE Bilateral   . CESAREAN SECTION    . ESOPHAGOGASTRODUODENOSCOPY N/A 01/23/2016   Procedure: ESOPHAGOGASTRODUODENOSCOPY (EGD);  Surgeon: Arta Silence, MD;  Location: [facility];  Service: Endoscopy;  Laterality: N/A;  . MYOMECTOMY    . NEVUS EXCISION Left 04/02/2012   Procedure: Excision Nevus Left Abdominal Wall;  Surgeon: Adin Hector, MD;  Location: [facility];  Service: General;  Laterality: Left;  . TONSILLECTOMY       reports that she quit smoking about 19 years ago. Her smoking use included Cigarettes. She quit after 20.00 years of use. She has never used smokeless tobacco. She reports that she does not drink alcohol or use drugs.  Allergies  Allergen Reactions  . Sertraline Hcl Hives  . Accolate [Zafirlukast]     Unknown reaction per MAR   . Avandia [Rosiglitazone] Hives and Swelling  . Codeine Hives and Swelling  . Cyclobenzaprine Swelling and Hives  . Furosemide Swelling    REACTION: face was swollen and hand  . Gadolinium Derivatives Swelling  . Hydrocodone-Acetaminophen Hives and Swelling  . Iohexol      Code: SOB, Desc: xray dye, Onset Date: [id number]   . Levofloxacin     REACTION: PANIC ATTACK,DIARRHEA,EYES DISCOLORD  . Lorazepam Nausea And Vomiting  . Lubiprostone Nausea Only    REACTION: REALLY BAD NAUSEA  . Metformin Hives and Swelling  .  Metoclopramide Nausea And Vomiting and Swelling  . Oxycodone Hives and Swelling  . Promethazine Hcl     REACTION: TRAMA ANDJITTERS IN MY BODY  . Verapamil Hives and Nausea And Vomiting  . Fentanyl Anxiety    REACTION: PANIC ATTACKS  . Morphine Itching and Anxiety    REACTION: itching, panic attacks    Family History  Problem Relation Age of Onset  . Heart disease Mother   . Heart disease Brother   . Diabetes Brother   . Arthritis Sister   . Stroke Sister   . Cirrhosis Brother   . Cancer Brother        Bladder cancer     Prior to Admission medications   Medication Sig Start Date End Date Taking? Authorizing Provider  acetaminophen (TYLENOL) 325 MG tablet Take 325 mg by mouth every 4 (four) hours as needed for moderate pain.    Yes [provider]  anastrozole (ARIMIDEX) 1 MG tablet Take 1 mg by mouth daily.   Yes [provider]  atropine 1 % ophthalmic solution Place 1 drop into both eyes twice daily   Yes [provider]  calcium carbonate (TUMS - DOSED IN MG ELEMENTAL CALCIUM) 500 MG chewable tablet Chew 1 tablet by mouth 3 (three) times daily.   Yes [provider]  carbidopa-levodopa (SINEMET) 25-100 MG tablet Take 1/2 tab twice daily x 3 days, then 1/2 tab three times daily x 4 days, then 1 tab three times daily Patient taking differently: Take 1 tablet by mouth 3 (three) times daily.  11/24/14  Yes Jaffe, Adam R, DO  Cholecalciferol (VITAMIN D3) 1000 UNITS CAPS Take 2,000 Units by mouth daily.   Yes [provider]  clopidogrel (PLAVIX) 75 MG tablet Take 75 mg by mouth daily.   Yes [provider]  Cranberry 450 MG TABS Take 450 mg by mouth 2 (two) times daily.    Yes [provider]  diclofenac sodium (VOLTAREN) 1 % GEL Apply 4 g topically 3 (three) times daily as needed (joint pain).   Yes [provider]  docusate sodium 100 MG CAPS Take 100 mg by mouth 2 (two) times daily. 02/06/13  Yes York, Marianne  L, PA-C  DULoxetine (CYMBALTA) 60 MG capsule Take 60 mg by mouth 2 (two) times daily.   Yes [provider]  fluticasone (FLONASE) 50 MCG/ACT nasal spray Place 2 sprays into both nostrils daily as needed for rhinitis or allergies. 02/06/13  Yes York, Marianne L, PA-C  furosemide (LASIX) 20 MG tablet Take 20 mg by mouth daily.  12/29/15  Yes [provider]  gabapentin (NEURONTIN) 600 MG tablet Take 600 mg by mouth 2 (two) times daily.    Yes [provider]  insulin detemir (LEVEMIR) 100 unit/ml SOLN Inject 50-100 Units into the  skin 2 (two) times daily. Takes 100 units in the morning and 50 units at bedtime   Yes [provider]  insulin lispro (HUMALOG) 100 UNIT/ML injection Inject 60-80 Units into the skin 3 (three) times daily with meals. Inject 80 units at 8AM and noon and inject 60 units at supper time   Yes [provider]  ipratropium-albuterol (DUONEB) 0.5-2.5 (3) MG/3ML SOLN Take 3 mLs by nebulization every 6 (six) hours as needed (shortness of breath). 02/06/13  Yes York, Marianne L, PA-C  lisinopril (PRINIVIL,ZESTRIL) 5 MG tablet Take 5 mg by mouth 2 (two) times daily.  01/10/16  Yes [provider]  loratadine (CLARITIN) 10 MG tablet Take 10 mg by mouth daily.   Yes [provider]  magnesium hydroxide (MILK OF MAGNESIA) 400 MG/5ML suspension Take 30 mLs by mouth daily as needed for mild constipation.   Yes [provider]  Melatonin 3 MG TABS Take 3 mg by mouth at bedtime.   Yes [provider]  Menthol, Topical Analgesic, (BIOFREEZE) 4 % GEL Apply 1 application topically 2 (two) times daily. Apply to buttock's   Yes [provider]  Menthol, Topical Analgesic, (BIOFREEZE) 4 % GEL Apply 1 application topically 4 (four) times daily as needed (for pain).   Yes [provider]  metoprolol succinate (TOPROL-XL) 50 MG 24 hr tablet Take 50 mg by mouth daily.  01/06/16  Yes [provider]    pantoprazole (PROTONIX) 40 MG tablet Take 1 tablet (40 mg total) by mouth 2 (two) times daily. Patient taking differently: Take 40 mg by mouth daily.  01/24/16  Yes Sheikh, Omair Latif, DO  phenol (SORE THROAT SPRAY) 1.4 % LIQD Use as directed 1 spray in the mouth or throat every 4 (four) hours as needed for throat irritation / pain.   Yes [provider]  phenylephrine-shark liver oil-mineral oil-petrolatum (PREPARATION H) 0.25-3-14-71.9 % rectal ointment Place 1 application rectally 2 (two) times daily as needed for hemorrhoids.   Yes [provider]  polyethylene glycol powder (GLYCOLAX/MIRALAX) powder Take 17 g by mouth daily.  01/06/16  Yes [provider]  potassium chloride (K-DUR,KLOR-CON) 10 MEQ tablet Take 10 mEq by mouth daily.  12/09/15  Yes [provider]  pravastatin (PRAVACHOL) 20 MG tablet Take 20 mg by mouth daily.  12/27/15  Yes [provider]  prednisoLONE acetate (PRED FORTE) 1 % ophthalmic suspension Place 1 drop into both eyes 2 (two) times daily.  01/10/16  Yes [provider]  QUEtiapine (SEROQUEL) 25 MG tablet Take 25 mg by mouth 2 (two) times daily.   Yes [provider]  rivastigmine (EXELON) 1.5 MG capsule Take 1.5 mg by mouth 2 (two) times daily.  01/01/16  Yes [provider]  Sennosides-Docusate Sodium (SENNA S PO) Take 1 tablet by mouth 2 (two) times daily.    Yes [provider]  tiZANidine (ZANAFLEX) 2 MG tablet Take 2 mg by mouth 2 (two) times daily.  01/10/16  Yes [provider]  tiZANidine (ZANAFLEX) 2 MG tablet Take 2 mg by mouth every 8 (eight) hours as needed for muscle spasms.   Yes [provider]  traMADol (ULTRAM) 50 MG tablet Take 0.5 tablets (25 mg total) by mouth every 6 (six) hours as needed for moderate pain. 01/24/16  Yes Sheikh, Omair Latif, DO  ALPRAZolam Duanne Moron) 0.5 MG tablet Take 1 tablet (0.5 mg total) by mouth at bedtime as needed for anxiety. Patient  not taking: Reported on  03/29/2016 01/24/16   Raiford Noble Latif, DO  cefpodoxime (VANTIN) 100 MG tablet Take 1 tablet (100 mg total) by mouth every 12 (twelve) hours. Patient not taking: Reported on 03/29/2016 01/25/16   Kerney Elbe, DO    Physical Exam: Vitals:   05/21/16 1330 05/21/16 1415 05/21/16 1420 05/21/16 1500  BP: (!) 148/53 (!) 169/61  (!) 146/67  Pulse:   70 71  Resp: 17 19 17 17   Temp:      TempSrc:      SpO2:   100% 96%  Weight:      Height:          Constitutional: yelling out and deconditioned appearing Vitals:   05/21/16 1330 05/21/16 1415 05/21/16 1420 05/21/16 1500  BP: (!) 148/53 (!) 169/61  (!) 146/67  Pulse:   70 71  Resp: 17 19 17 17   Temp:      TempSrc:      SpO2:   100% 96%  Weight:      Height:       Eyes: PERRL, lids and conjunctivae normal ENMT: Mucous membranes are dry. Poor dentation Neck: normal, supple, no masses, no thyromegaly Respiratory: clear to auscultation bilaterally, no wheezing, no crackles. Normal respiratory effort. No accessory muscle use.  Cardiovascular: Regular rate and rhythm, no murmurs / rubs / gallops. No extremity edema. 2+ pedal pulses. No carotid bruits.  Abdomen: no tenderness, no masses palpated. No hepatosplenomegaly. Bowel sounds positive.  Musculoskeletal: no clubbing / cyanosis. Contracture on left arm Skin: no rashes, lesions, ulcers. No induration Neurologic: contracture on left arm, right arm moved freely- unable to fully cooperate with exam Psychiatric: not able to answer orientation questions     Labs on Admission: I have personally reviewed following labs and imaging studies  CBC:  Recent Labs Lab 05/21/16 1320  WBC 10.1  NEUTROABS 8.4*  HGB 11.9*  HCT 35.8*  MCV 95.5  PLT 093   Basic Metabolic Panel:  Recent Labs Lab 05/21/16 1320  NA 141  K 4.4  CL 104  CO2 27  GLUCOSE 52*  BUN 38*  CREATININE 1.74*  CALCIUM 9.8   GFR: Estimated Creatinine Clearance: 30.9 mL/min (A) (by  C-G formula based on SCr of 1.74 mg/dL (H)). Liver Function Tests:  Recent Labs Lab 05/21/16 1320  AST 20  ALT 7*  ALKPHOS 106  BILITOT 0.7  PROT 7.2  ALBUMIN 3.8   No results for input(s): LIPASE, AMYLASE in the last 168 hours. No results for input(s): AMMONIA in the last 168 hours. Coagulation Profile: No results for input(s): INR, PROTIME in the last 168 hours. Cardiac Enzymes: No results for input(s): CKTOTAL, CKMB, CKMBINDEX, TROPONINI in the last 168 hours. BNP (last 3 results) No results for input(s): PROBNP in the last 8760 hours. HbA1C: No results for input(s): HGBA1C in the last 72 hours. CBG: No results for input(s): GLUCAP in the last 168 hours. Lipid Profile: No results for input(s): CHOL, HDL, LDLCALC, TRIG, CHOLHDL, LDLDIRECT in the last 72 hours. Thyroid Function Tests: No results for input(s): TSH, T4TOTAL, FREET4, T3FREE, THYROIDAB in the last 72 hours. Anemia Panel: No results for input(s): VITAMINB12, FOLATE, FERRITIN, TIBC, IRON, RETICCTPCT in the last 72 hours. Urine analysis:    Component Value Date/Time   COLORURINE YELLOW 05/21/2016 1412   APPEARANCEUR HAZY (A) 05/21/2016 1412   LABSPEC 1.013 05/21/2016 1412   PHURINE 6.0 05/21/2016 1412   GLUCOSEU NEGATIVE 05/21/2016 1412   GLUCOSEU > or = 1000 mg/dL (AA) 09/09/2007  [place] (A) 05/21/2016 1412   HGBUR negative 01/27/2008 1543   BILIRUBINUR NEGATIVE 05/21/2016 1412   KETONESUR NEGATIVE 05/21/2016 1412   PROTEINUR 100 (A) 05/21/2016 1412   UROBILINOGEN 1.0 02/04/2013 2309   NITRITE POSITIVE (A) 05/21/2016 1412   LEUKOCYTESUR SMALL (A) 05/21/2016 1412   Sepsis Labs: Invalid input(s): PROCALCITONIN, LACTICIDVEN No results found for this or any previous visit (from the past 240 hour(s)).   Radiological Exams on Admission: Ct Abdomen Pelvis Wo Contrast  Result Date: 05/21/2016 CLINICAL DATA:  Abdominal pain.  Right lower quadrant. EXAM: CT ABDOMEN AND PELVIS WITHOUT CONTRAST  TECHNIQUE: Multidetector CT imaging of the abdomen and pelvis was performed following the standard protocol without IV contrast. COMPARISON:  01/13/2009 FINDINGS: Lower chest: Scar identified within the left lower lobe. No pleural effusions. Hepatobiliary: No suspicious liver abnormality. Stones noted within the dependent portion of the gallbladder measure up to 5 mm. No biliary dilatation. Pancreas: Again noted are several small calcifications in the head of pancreas. No pancreatic ductal dilatation or surrounding inflammatory changes. Spleen: Upper limits of normal in length measuring 12 cm. No focal splenic abnormality identified within the limitations of unenhanced technique. Adrenals/Urinary Tract: Normal appearance of the adrenal glands. No nephrolithiasis or hydronephrosis. Urinary bladder appears normal. Stomach/Bowel: The stomach is normal. No dilated loops of small or large bowel. Unremarkable appearance of the colon. The appendix is difficult to identified separate from the right lower quadrant bowel loops. No secondary signs of acute appendicitis. Vascular/Lymphatic: Aortic atherosclerosis. No aneurysm. There is no upper abdominal adenopathy. No pelvic or inguinal adenopathy identified. Reproductive: Status post hysterectomy. No adnexal masses. Other: No abdominal wall hernia or abnormality. No abdominopelvic ascites. Musculoskeletal: Anterolisthesis of L4 on L5 noted. There is degenerative disc disease identified at L5-S1. Status post L5 laminectomy. IMPRESSION: 1. No acute findings identified within the abdomen or pelvis. Nonvisualization of the appendix. 2.  Aortic Atherosclerosis (ICD10-I70.0). 3. Gallstones 4. Lumbar spondylosis. Electronically Signed   By: Kerby Moors M.D.   On: 05/21/2016 15:03   Ct Head Wo Contrast  Result Date: 05/21/2016 CLINICAL DATA:  Agitation, altered mental status and seizure like activity today. Abdominal pain. History of diabetes, hypertension, breast cancer,  migraines. EXAM: CT HEAD WITHOUT CONTRAST TECHNIQUE: Contiguous axial images were obtained from the base of the skull through the vertex without intravenous contrast. COMPARISON:  CT HEAD November 11, 2015 FINDINGS: BRAIN: No intraparenchymal hemorrhage, mass effect nor midline shift. The ventricles and sulci are normal for age. Patchy supratentorial white matter hypodensities within normal range for patient's age, though non-specific are most compatible with chronic small vessel ischemic disease. Old basal ganglia and RIGHT thalamus lacunar infarcts. No acute large vascular territory infarcts. No abnormal extra-axial fluid collections. Basal cisterns are patent. VASCULAR: Mild to moderate calcific atherosclerosis of the carotid siphons. SKULL: No skull fracture. Severe temporomandibular osteoarthrosis. No significant scalp soft tissue swelling. SINUSES/ORBITS: Trace LEFT mastoid effusion. Paranasal sinus are well aerated. The included ocular globes and orbital contents are non-suspicious. Status post bilateral ocular lens implants. OTHER: None. IMPRESSION: No acute intracranial process. Stable examination including old lacunar infarcts and moderate chronic small vessel ischemic disease. Electronically Signed   By: Elon Alas M.D.   On: 05/21/2016 14:56    EKG: Independently reviewed. Sinus, low voltage  Assessment/Plan Active Problems:   Type 2 diabetes mellitus with hypoglycemia without coma ([place])   OBESITY   Essential hypertension   Lower urinary tract infectious disease   Parkinson disease ([place])  AKI (acute kidney injury) ([place])   Hypoglycemia due to insulin   Hypoglycemia -?seizure like activity-- able to speak through -suspect due to decreased intake and AKI -hold insulin and monitor with SSI-- suspect will need an adjustment before d/c -patient eating, start d5 if blood sugars continue to be low -doubt seizures and if were seizures, patient back to baseline and source would be low  blood sugars  AKI -IVF -recheck in AM  UTI -rocephin -urine culture added on  Type 2 DM -see above -will need adjustment of meds before d/c to avoid hypoglycemia  Parkinson -continue sinemet  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

## 2016-05-21 NOTE — ED Notes (Signed)
Gave pt pre-packed bag lunch ([place] sandwich and applesauce) and water, per Dr. Billy Fischer.

## 2016-05-22 DIAGNOSIS — E11649 Type 2 diabetes mellitus with hypoglycemia without coma: Secondary | ICD-10-CM

## 2016-05-22 DIAGNOSIS — I10 Essential (primary) hypertension: Secondary | ICD-10-CM | POA: Diagnosis not present

## 2016-05-22 DIAGNOSIS — Z794 Long term (current) use of insulin: Secondary | ICD-10-CM | POA: Diagnosis not present

## 2016-05-22 DIAGNOSIS — G20 Parkinson's disease: Secondary | ICD-10-CM | POA: Diagnosis not present

## 2016-05-22 DIAGNOSIS — N179 Acute kidney failure, unspecified: Secondary | ICD-10-CM | POA: Diagnosis not present

## 2016-05-22 DIAGNOSIS — N182 Chronic kidney disease, stage 2 (mild): Secondary | ICD-10-CM | POA: Diagnosis not present

## 2016-05-22 DIAGNOSIS — E6609 Other obesity due to excess calories: Secondary | ICD-10-CM | POA: Diagnosis not present

## 2016-05-22 DIAGNOSIS — Z6834 Body mass index (BMI) 34.0-34.9, adult: Secondary | ICD-10-CM

## 2016-05-22 DIAGNOSIS — E160 Drug-induced hypoglycemia without coma: Secondary | ICD-10-CM | POA: Diagnosis not present

## 2016-05-22 DIAGNOSIS — N390 Urinary tract infection, site not specified: Secondary | ICD-10-CM | POA: Diagnosis not present

## 2016-05-22 DIAGNOSIS — R4182 Altered mental status, unspecified: Secondary | ICD-10-CM

## 2016-05-22 DIAGNOSIS — T383X5A Adverse effect of insulin and oral hypoglycemic [antidiabetic] drugs, initial encounter: Secondary | ICD-10-CM | POA: Diagnosis not present

## 2016-05-22 LAB — BASIC METABOLIC PANEL
ANION GAP: 10 (ref 5–15)
BUN: 31 mg/dL — ABNORMAL HIGH (ref 6–20)
CHLORIDE: 106 mmol/L (ref 101–111)
CO2: 24 mmol/L (ref 22–32)
CREATININE: 1.54 mg/dL — AB (ref 0.44–1.00)
Calcium: 9.1 mg/dL (ref 8.9–10.3)
GFR calc non Af Amer: 32 mL/min — ABNORMAL LOW (ref >60)
GFR, EST AFRICAN AMERICAN: 37 mL/min — AB (ref >60)
Glucose, Bld: 134 mg/dL — ABNORMAL HIGH (ref 65–99)
POTASSIUM: 4.6 mmol/L (ref 3.5–5.1)
SODIUM: 140 mmol/L (ref 135–145)

## 2016-05-22 LAB — GLUCOSE, CAPILLARY
GLUCOSE-CAPILLARY: 109 mg/dL — AB (ref 65–99)
Glucose-Capillary: 131 mg/dL — ABNORMAL HIGH (ref 65–99)
Glucose-Capillary: 138 mg/dL — ABNORMAL HIGH (ref 65–99)
Glucose-Capillary: 144 mg/dL — ABNORMAL HIGH (ref 65–99)

## 2016-05-22 LAB — CBC
HCT: 32 % — ABNORMAL LOW (ref 36.0–46.0)
Hemoglobin: 10.6 g/dL — ABNORMAL LOW (ref 12.0–15.0)
MCH: 31.7 pg (ref 26.0–34.0)
MCHC: 33.1 g/dL (ref 30.0–36.0)
MCV: 95.8 fL (ref 78.0–100.0)
PLATELETS: 175 10*3/uL (ref 150–400)
RBC: 3.34 MIL/uL — AB (ref 3.87–5.11)
RDW: 14.4 % (ref 11.5–15.5)
WBC: 4.5 10*3/uL (ref 4.0–10.5)

## 2016-05-22 LAB — MRSA PCR SCREENING: MRSA by PCR: NEGATIVE

## 2016-05-22 MED ORDER — INSULIN DETEMIR 100 UNIT/ML ~~LOC~~ SOLN
5.0000 [IU] | Freq: Every day | SUBCUTANEOUS | Status: DC
  Administered 2016-05-22: 5 [IU] via SUBCUTANEOUS
  Filled 2016-05-22 (×2): qty 0.05

## 2016-05-22 NOTE — Care Management Obs Status (Signed)
[place] [facility] NOTIFICATION   Patient Details  Name: Katherine Carr MRN: [id number] Date of Birth: [birth year]   Medicare Observation Status Notification Given:  Yes (explained notice to husband, [name] # [phone] via phone. left copy in room.)    Erenest Rasher, RN 05/22/2016, 4:57 PM

## 2016-05-22 NOTE — Progress Notes (Signed)
[facility] PROGRESS NOTE  Katherine Carr FYB:[id number] DOB: 1940/07/21 DOA: 05/21/2016 PCP: Wenda Low, MD  Interim summary history of present illness 76 year old female with medical history significant for diabetes, history of CVA, blindness, chronic kidney disease stage II, hypertension, Parkinson disease and dementia; who presents to the emergency department from a skilled nursing facility Katherine Carr has been living there for 8 years now); secondary to decreased level of awareness, seizure-like activity and recent anorexia with increase agitation. In the ED patient was found to be hypoglycemic and her UA consistent with UTI.   Assessment/Plan: 1-UTI -Urine culture pending -Will continue fluid resuscitation and empiric antibiotics -Will follow clinical response -Patient advised to increase potassium patient in keeping herself well hydrated.  2-acute renal failure on chronic kidney disease stage II -Patient baseline creatinine 1.04-1.2 -Creatinine on admission up to 1.74 -most likely secondary to decreased by mouth intake inducing dehydration/prerenal ischemia and also due to UTI. -continue fluid resuscitation and patient advised to increase fluid intake for better hydration.  -Will follow renal function trend.   3-type 2 diabetes mellitus chronically on insulin: With hypoglycemia  -Most likely secondary to decreased by mouth intake and also worsening renal function.  -Will continue sliding scale insulin and now that she is eating more will start low-dose Levemir  -Follow CBGs and adjust hypoglycemic regimen as needed   4-history of CVA with left residual hemiparesis:  -Continue Plavix for secondary prevention -Continue treatment of her risk factors (hypertension, diabetes, etc...) -No new focal deficit appreciated.  5-seizure like activity -Patient was able to speak through the episode and breathing have any post ictal, neither bladder or stool incontinence. -There  was no signs of tongue biting -Most likely triggered by hypoglycemia in the presence of UTI. -Will continue to monitor.  6-Parkinson disease and dementia  -Continue Sinemet and Exelon patch  -Continue supportive care.   7-hypertension  -Stable overall -Will resume home antihypertensive regimen. -Monitor vital signs.  8-GERD -continue PPI  9-depression -Continue Cymbalta -No hallucinations or suicidal ideation appreciated.   10-obesity -Body mass index is 34.16 kg/m. -Low calorie diet and healthy options discussed with patient.  Code Status: DNR Family Communication: No family at bedside Disposition Plan: Anticipate discharge back to skilled nursing facility in the next 24-48 hours. Continue antibiotics, follow cultures, start transitioning/at [facility] insulin therapy for her diabetes and follow further improvement of her renal function.  Consultants:  None  Procedures:  See below for x-ray reports  Antibiotics:  Rocephin 5/21  HPI/Subjective: Afebrile, denies chest pain, shortness of breath, nausea, vomiting and is currently oriented 2 (unknown baseline). No further hypoglycemic events and no further seizure like activity appreciated.  Objective: Vitals:   05/22/16 0901 05/22/16 1336  BP: (!) 164/94 (!) 118/50  Pulse:  82  Resp:  18  Temp:  99.5 F (37.5 C)    Intake/Output Summary (Last 24 hours) at 05/22/16 1640 Last data filed at 05/21/16 2150  Gross per 24 hour  Intake             1050 ml  Output                0 ml  Net             1050 ml   Filed Weights   05/21/16 1124  Weight: 90.3 kg (199 lb)    Exam:   General:  Obese, in no acute distress. Patient is blind. Afebrile, able to follow commands and oriented 2 during my interview. Denies  chest pain and shortness of breath. Endorses vague diffuse abd discomfort. No nausea, no vomiting.  Cardiovascular: S1 and S2, no rubs, no gallops; unable to asses JVD due to body habitus.  Respiratory:  No using accessory muscles. Good air movement bilaterally, no wheezing, no crackles  Abdomen: Obese, soft, nondistended, mild diffuse and vague tenderness with deep palpation, positive bowel sounds  Musculoskeletal: Patient with left upper extremity contracture and also signs of atrophy of her lower extremities bilaterally; no swelling, no erythema, no cyanosis.  Data Reviewed: Basic Metabolic Panel:  Recent Labs Lab 05/21/16 1320 05/22/16 0706  NA 141 140  K 4.4 4.6  CL 104 106  CO2 27 24  GLUCOSE 52* 134*  BUN 38* 31*  CREATININE 1.74* 1.54*  CALCIUM 9.8 9.1   Liver Function Tests:  Recent Labs Lab 05/21/16 1320  AST 20  ALT 7*  ALKPHOS 106  BILITOT 0.7  PROT 7.2  ALBUMIN 3.8   CBC:  Recent Labs Lab 05/21/16 1320 05/22/16 0706  WBC 10.1 4.5  NEUTROABS 8.4*  --   HGB 11.9* 10.6*  HCT 35.8* 32.0*  MCV 95.5 95.8  PLT 213 175   CBG:  Recent Labs Lab 05/21/16 2037 05/22/16 0739 05/22/16 1157  GLUCAP 107* 131* 138*    Recent Results (from the past 240 hour(s))  Blood culture (routine x 2)     Status: None (Preliminary result)   Collection Time: 05/21/16  6:06 PM  Result Value Ref Range Status   Specimen Description BLOOD RIGHT FOREARM  Final   Special Requests BOTTLES DRAWN AEROBIC ONLY BCAV  Final   Culture NO GROWTH < 24 HOURS  Final   Report Status PENDING  Incomplete  Blood culture (routine x 2)     Status: None (Preliminary result)   Collection Time: 05/21/16  6:22 PM  Result Value Ref Range Status   Specimen Description BLOOD BLOOD RIGHT FOREARM  Final   Special Requests   Final    BOTTLES DRAWN AEROBIC AND ANAEROBIC Blood Culture adequate volume   Culture NO GROWTH < 24 HOURS  Final   Report Status PENDING  Incomplete  MRSA PCR Screening     Status: None   Collection Time: 05/21/16 10:00 PM  Result Value Ref Range Status   MRSA by PCR NEGATIVE NEGATIVE Final    Comment:        The GeneXpert MRSA Assay (FDA approved for NASAL  specimens only), is one component of a comprehensive MRSA colonization surveillance program. It is not intended to diagnose MRSA infection nor to guide or monitor treatment for MRSA infections.      Studies: Ct Abdomen Pelvis Wo Contrast  Result Date: 05/21/2016 CLINICAL DATA:  Abdominal pain.  Right lower quadrant. EXAM: CT ABDOMEN AND PELVIS WITHOUT CONTRAST TECHNIQUE: Multidetector CT imaging of the abdomen and pelvis was performed following the standard protocol without IV contrast. COMPARISON:  01/13/2009 FINDINGS: Lower chest: Scar identified within the left lower lobe. No pleural effusions. Hepatobiliary: No suspicious liver abnormality. Stones noted within the dependent portion of the gallbladder measure up to 5 mm. No biliary dilatation. Pancreas: Again noted are several small calcifications in the head of pancreas. No pancreatic ductal dilatation or surrounding inflammatory changes. Spleen: Upper limits of normal in length measuring 12 cm. No focal splenic abnormality identified within the limitations of unenhanced technique. Adrenals/Urinary Tract: Normal appearance of the adrenal glands. No nephrolithiasis or hydronephrosis. Urinary bladder appears normal. Stomach/Bowel: The stomach is normal. No dilated loops of small  or large bowel. Unremarkable appearance of the colon. The appendix is difficult to identified separate from the right lower quadrant bowel loops. No secondary signs of acute appendicitis. Vascular/Lymphatic: Aortic atherosclerosis. No aneurysm. There is no upper abdominal adenopathy. No pelvic or inguinal adenopathy identified. Reproductive: Status post hysterectomy. No adnexal masses. Other: No abdominal wall hernia or abnormality. No abdominopelvic ascites. Musculoskeletal: Anterolisthesis of L4 on L5 noted. There is degenerative disc disease identified at L5-S1. Status post L5 laminectomy. IMPRESSION: 1. No acute findings identified within the abdomen or pelvis.  Nonvisualization of the appendix. 2.  Aortic Atherosclerosis (ICD10-I70.0). 3. Gallstones 4. Lumbar spondylosis. Electronically Signed   By: Kerby Moors M.D.   On: 05/21/2016 15:03   Ct Head Wo Contrast  Result Date: 05/21/2016 CLINICAL DATA:  Agitation, altered mental status and seizure like activity today. Abdominal pain. History of diabetes, hypertension, breast cancer, migraines. EXAM: CT HEAD WITHOUT CONTRAST TECHNIQUE: Contiguous axial images were obtained from the base of the skull through the vertex without intravenous contrast. COMPARISON:  CT HEAD November 11, 2015 FINDINGS: BRAIN: No intraparenchymal hemorrhage, mass effect nor midline shift. The ventricles and sulci are normal for age. Patchy supratentorial white matter hypodensities within normal range for patient's age, though non-specific are most compatible with chronic small vessel ischemic disease. Old basal ganglia and RIGHT thalamus lacunar infarcts. No acute large vascular territory infarcts. No abnormal extra-axial fluid collections. Basal cisterns are patent. VASCULAR: Mild to moderate calcific atherosclerosis of the carotid siphons. SKULL: No skull fracture. Severe temporomandibular osteoarthrosis. No significant scalp soft tissue swelling. SINUSES/ORBITS: Trace LEFT mastoid effusion. Paranasal sinus are well aerated. The included ocular globes and orbital contents are non-suspicious. Status post bilateral ocular lens implants. OTHER: None. IMPRESSION: No acute intracranial process. Stable examination including old lacunar infarcts and moderate chronic small vessel ischemic disease. Electronically Signed   By: Elon Alas M.D.   On: 05/21/2016 14:56    Scheduled Meds: . anastrozole  1 mg Oral Daily  . atropine  1 drop Both Eyes BID  . calcium carbonate  1 tablet Oral TID  . carbidopa-levodopa  1 tablet Oral TID  . clopidogrel  75 mg Oral Daily  . DULoxetine  60 mg Oral BID  . gabapentin  600 mg Oral BID  . heparin   5,000 Units Subcutaneous Q8H  . insulin aspart  0-9 Units Subcutaneous TID WC  . insulin detemir  5 Units Subcutaneous QHS  . lisinopril  5 mg Oral BID  . loratadine  10 mg Oral Daily  . metoprolol succinate  50 mg Oral Daily  . pantoprazole  40 mg Oral Daily  . polyethylene glycol  17 g Oral Daily  . prednisoLONE acetate  1 drop Both Eyes BID  . QUEtiapine  25 mg Oral BID  . rivastigmine  1.5 mg Oral BID  . tiZANidine  2 mg Oral BID   Continuous Infusions: . sodium chloride 50 mL/hr at 05/21/16 2150  . cefTRIAXone (ROCEPHIN)  IV    . cefTRIAXone (ROCEPHIN)  IV Stopped (05/21/16 2220)    Active Problems:   Type 2 diabetes mellitus with hypoglycemia without coma ([place])   OBESITY   Essential hypertension   Lower urinary tract infectious disease   Parkinson disease ([place])   UTI (urinary tract infection)   AKI (acute kidney injury) ([place])     Time spent: 25 minutes   Barton Dubois  [facility] Pager [phone]. If 7PM-7AM, please contact night-coverage at [email or web address], password [facility] 05/22/2016, 4:40 PM  LOS: 1 day

## 2016-05-22 NOTE — Care Management CC44 (Signed)
Condition Code 44 Documentation Completed  Patient Details  Name: JENELLA CRAIGIE MRN: [id number] Date of Birth: [birth year]   Condition Code 44 given:  Yes Patient signature on Condition Code 44 notice:  Yes Documentation of 2 MD's agreement:  Yes Code 44 added to claim:  Yes    Erenest Rasher, RN 05/22/2016, 4:57 PM

## 2016-05-22 NOTE — Care Management Note (Signed)
Case Management Note  Patient Details  Name: Katherine Carr MRN: [id number] Date of Birth: 05/03/1940  Subjective/Objective:  DM, hypoglycemia, AKI, UTI                 Action/Plan: Discharge Planning: NCM spoke to husband and plan is dc back to SNF tomorrow. Attending notified of [place]. Explained notice to husband, [name] via phone [phone]. He verbalized understanding. CSW following for return back to SNF.   PCP Wenda Low MD  Expected Discharge Date:  05/22/2016              Expected Discharge Plan:  [place]  In-House Referral:  NA  Discharge planning Services  CM Consult  Post Acute Care Choice:  NA Choice offered to:  NA  DME Arranged:  N/A DME Agency:  NA  HH Arranged:  NA HH Agency:  NA  Status of Service:  Completed, signed off  If discussed at [place] of Stay Meetings, dates discussed:    Additional Comments:  Erenest Rasher, RN 05/22/2016, 4:58 PM

## 2016-05-23 DIAGNOSIS — N390 Urinary tract infection, site not specified: Secondary | ICD-10-CM | POA: Diagnosis not present

## 2016-05-23 LAB — CBC
HCT: 30.2 % — ABNORMAL LOW (ref 36.0–46.0)
HEMOGLOBIN: 9.8 g/dL — AB (ref 12.0–15.0)
MCH: 31.1 pg (ref 26.0–34.0)
MCHC: 32.5 g/dL (ref 30.0–36.0)
MCV: 95.9 fL (ref 78.0–100.0)
Platelets: 145 10*3/uL — ABNORMAL LOW (ref 150–400)
RBC: 3.15 MIL/uL — AB (ref 3.87–5.11)
RDW: 14.1 % (ref 11.5–15.5)
WBC: 3.8 10*3/uL — AB (ref 4.0–10.5)

## 2016-05-23 LAB — BASIC METABOLIC PANEL
Anion gap: 10 (ref 5–15)
BUN: 23 mg/dL — ABNORMAL HIGH (ref 6–20)
CO2: 23 mmol/L (ref 22–32)
CREATININE: 1.58 mg/dL — AB (ref 0.44–1.00)
Calcium: 8.8 mg/dL — ABNORMAL LOW (ref 8.9–10.3)
Chloride: 107 mmol/L (ref 101–111)
GFR, EST AFRICAN AMERICAN: 36 mL/min — AB (ref >60)
GFR, EST NON AFRICAN AMERICAN: 31 mL/min — AB (ref >60)
Glucose, Bld: 114 mg/dL — ABNORMAL HIGH (ref 65–99)
POTASSIUM: 4.4 mmol/L (ref 3.5–5.1)
SODIUM: 140 mmol/L (ref 135–145)

## 2016-05-23 LAB — GLUCOSE, CAPILLARY
GLUCOSE-CAPILLARY: 93 mg/dL (ref 65–99)
Glucose-Capillary: 126 mg/dL — ABNORMAL HIGH (ref 65–99)
Glucose-Capillary: 123 mg/dL — ABNORMAL HIGH (ref 65–99)

## 2016-05-23 MED ORDER — FUROSEMIDE 20 MG PO TABS: 20.0000 mg | ORAL_TABLET | Freq: Every day | ORAL | 0 refills | Status: AC | PRN

## 2016-05-23 MED ORDER — INSULIN ASPART 100 UNIT/ML ~~LOC~~ SOLN: 0.0000 [IU] | Freq: Three times a day (TID) | SUBCUTANEOUS | 0 refills | Status: AC

## 2016-05-23 MED ORDER — CEPHALEXIN 500 MG PO CAPS
500.0000 mg | ORAL_CAPSULE | Freq: Two times a day (BID) | ORAL | Status: DC
  Administered 2016-05-23: 500 mg via ORAL
  Filled 2016-05-23: qty 1

## 2016-05-23 MED ORDER — INSULIN DETEMIR 100 UNIT/ML ~~LOC~~ SOLN: 5.0000 [IU] | Freq: Every day | SUBCUTANEOUS | 0 refills | Status: AC

## 2016-05-23 MED ORDER — CEPHALEXIN 500 MG PO CAPS: 500.0000 mg | ORAL_CAPSULE | Freq: Two times a day (BID) | ORAL | 0 refills | Status: AC

## 2016-05-23 NOTE — Clinical Social Work Note (Signed)
Clinical Social Work Assessment  Patient Details  Name: Katherine Carr MRN: [id number] Date of Birth: 06/17/1940  Date of referral:  05/23/16               Reason for consult:  Facility Placement                Permission sought to share information with:  Facility Sport and exercise psychologist, Family Supports Permission granted to share information::  No  Name::     Sue/Jack  Agency::  [facility]  Relationship::  POA/Spouse  Contact Information:  [phone]  Housing/Transportation Living arrangements for the past 2 months:  [place] of Information:  Adult Children, Power of [place], Spouse Patient Interpreter Needed:  None Criminal Activity/Legal Involvement Pertinent to Current Situation/Hospitalization:  No - Comment as needed Significant Relationships:  Adult Children, Spouse, Friend Lives with:  Facility Resident Do you feel safe going back to the place where you live?  Yes Need for family participation in patient care:  Yes (Comment)  Care giving concerns:  CSW received consult regarding discharge planning. Patient is disoriented. CSW spoke with patient's son, spouse, and POA Collie Siad. Patient resides at [facility] and will return there at discharge. CSW to continue to follow and assist with discharge planning needs.   Social Worker assessment / plan:  CSW spoke with patient's family concerning return to [facility] at discharge.  Employment status:  Retired Nurse, adult PT Recommendations:  Not assessed at this time Information / Referral to community resources:  [place]  Patient/Family's Response to care:  Patient's family expressed agreement with plan. Patient's son is in [place] so he appreciates the hospital's care. Collie Siad, [place], expressed being unhappy with [facility] care and wants to make sure they take better care of patient.   Patient/Family's Understanding of and Emotional Response to Diagnosis,  Current Treatment, and Prognosis:  Patient/family is realistic regarding therapy needs and expressed being hopeful for SNF placement. Patient's family expressed understanding of CSW role and discharge process though they did have questions regarding patient's medical diagnoses. CSW directed them to the patient's RN. No questions/concerns about plan or treatment.    Emotional Assessment Appearance:  Appears stated age Attitude/Demeanor/Rapport:  Unable to Assess Affect (typically observed):  Unable to Assess Orientation:  Oriented to Self Alcohol / Substance use:  Not Applicable Psych involvement (Current and /or in the community):  No (Comment)  Discharge Needs  Concerns to be addressed:  Care Coordination Readmission within the last 30 days:  No Current discharge risk:  None Barriers to Discharge:  Continued Medical Work up   [employer], [place] 05/23/2016, 8:14 AM

## 2016-05-23 NOTE — NC FL2 (Signed)
[place] MEDICAID FL2 LEVEL OF CARE SCREENING TOOL     IDENTIFICATION  Patient Name: Katherine Carr Birthdate: Aug 12, 1940 Sex: female Admission Date (Current Location): 05/21/2016  [facility] and [place] Number:  Herbalist and Address:  [facility] [place], [place] [address], [place], [place] [address]      Provider Number: [id number]  Attending Physician Name and Address:  Lavina Hamman, MD  Relative Name and Phone Number:  [name], spouse, [phone]    Current Level of Care: Hospital Recommended Level of Care: [place] Prior Approval Number:    Date Approved/Denied:   PASRR Number:    Discharge Plan: SNF    Current Diagnoses: Patient Active Problem List   Diagnosis Date Noted  . UTI (urinary tract infection) 05/21/2016  . AKI (acute kidney injury) ([place]) 05/21/2016  . Hypoglycemia due to insulin 05/21/2016  . Acute cystitis with hematuria   . Tachycardia   . Hematemesis 01/21/2016  . Upper GI bleed 01/21/2016  . Lower urinary tract infectious disease 02/05/2013  . Neuropathy 02/05/2013  . HTN (hypertension), malignant 02/05/2013  . Parkinson disease ([place]) 02/05/2013  . Fever 02/05/2013  . Acute encephalopathy 10/29/2012  . Elevated LFTs 10/29/2012  . Common bile duct dilatation 10/29/2012  . Primary cancer of upper inner quadrant of right female breast ([place]) 05/19/2012  . Carcinoma in situ of breast 04/11/2012  . Intraductal papilloma of breast 03/17/2012  . ANXIETY DEPRESSION 03/24/2008  . ABDOMINAL PAIN-MULTIPLE SITES 02/17/2008  . LIVER FUNCTION TESTS, ABNORMAL, HX OF 02/12/2008  . GASTROPARESIS 09/24/2007  . BACK PAIN 09/24/2007  . Chest pain, unspecified 09/24/2007  . IRRITABLE BOWEL SYNDROME 09/09/2007  . DYSPHAGIA 09/09/2007  . Abdominal pain, epigastric 09/09/2007  . ABDOMINAL PAIN-GENERALIZED 09/09/2007  . URI 07/08/2007  . CLAUDICATION, INTERMITTENT 04/21/2007  . FATIGUE 04/21/2007  . EDEMA  04/21/2007  . OBESITY 02/26/2007  . DEPRESSION 02/26/2007  . MIGRAINE HEADACHE 02/26/2007  . ALLERGIC RHINITIS 02/26/2007  . ASTHMA 02/26/2007  . GERD 02/26/2007  . DEGENERATIVE DISC DISEASE 02/26/2007  . FIBROMYALGIA 02/26/2007  . RHEUMATIC FEVER, HX OF 02/26/2007  . ACUTE BRONCHITIS 02/21/2007  . HYPERLIPIDEMIA 01/31/2007  . LIVER FUNCTION TESTS, ABNORMAL 01/10/2007  . IBD 09/17/2006  . Type 2 diabetes mellitus with hypoglycemia without coma ([place]) 09/04/2006  . HYPERLIPIDEMIA 09/04/2006  . COMMON MIGRAINE 09/04/2006  . Essential hypertension 09/04/2006  . OSTEOARTHRITIS 09/04/2006  . SPONDYLOSIS, CERVICAL 09/04/2006  . CHEST PAIN, HX OF 09/04/2006  . ESOPHAGITIS 09/05/2004  . HIATAL HERNIA 09/05/2004  . HEMORRHOIDS, INTERNAL 02/18/2001  . GASTRITIS, CHRONIC 02/18/2001  . Diverticulosis of colon (without mention of hemorrhage) 02/18/2001    Orientation RESPIRATION BLADDER Height & Weight     Self  Normal Incontinent Weight: 90.3 kg (199 lb) Height:  5\' 4"  (162.6 cm)  BEHAVIORAL SYMPTOMS/MOOD NEUROLOGICAL BOWEL NUTRITION STATUS      Continent Diet (Please see DC Summary)  AMBULATORY STATUS COMMUNICATION OF NEEDS Skin   Extensive Assist Verbally Normal                       Personal Care Assistance Level of Assistance  Bathing, Feeding, Dressing Bathing Assistance: Maximum assistance Feeding assistance: Maximum assistance Dressing Assistance: Maximum assistance     Functional Limitations Info             SPECIAL CARE FACTORS FREQUENCY  Contractures      Additional Factors Info  Code Status, Allergies, Psychotropic, Insulin Sliding Scale Code Status Info: DNR Allergies Info:  Sertraline Hcl, Accolate Zafirlukast, Avandia Rosiglitazone, Codeine, Cyclobenzaprine, Furosemide, Gadolinium Derivatives, Hydrocodone-acetaminophen, Iohexol, Levofloxacin, Lorazepam, Lubiprostone, Metformin, Metoclopramide, Oxycodone, Promethazine Hcl,  Verapamil, Fentanyl, Morphine Psychotropic Info: Cymbalta, Seroquel Insulin Sliding Scale Info: 3x daily with meals       Current Medications (05/23/2016):  This is the current hospital active medication list Current Facility-Administered Medications  Medication Dose Route Frequency Provider Last Rate Last Dose  . 0.9 %  sodium chloride infusion   Intravenous Continuous Geradine Girt, DO 50 mL/hr at 05/22/16 2134    . acetaminophen (TYLENOL) tablet 650 mg  650 mg Oral Q6H PRN Eulogio Bear U, DO       Or  . acetaminophen (TYLENOL) suppository 650 mg  650 mg Rectal Q6H PRN Eulogio Bear U, DO      . anastrozole (ARIMIDEX) tablet 1 mg  1 mg Oral Daily Vann, Jessica U, DO   1 mg at 05/22/16 0900  . atropine 1 % ophthalmic solution 1 drop  1 drop Both Eyes BID Eulogio Bear U, DO   1 drop at 05/22/16 2134  . calcium carbonate (TUMS - dosed in mg elemental calcium) chewable tablet 200 mg of elemental calcium  1 tablet Oral TID Eulogio Bear U, DO   200 mg of elemental calcium at 05/22/16 2135  . carbidopa-levodopa (SINEMET IR) 25-100 MG per tablet immediate release 1 tablet  1 tablet Oral TID Eulogio Bear U, DO   1 tablet at 05/22/16 2134  . cefTRIAXone (ROCEPHIN) 1 g in dextrose 5 % 50 mL IVPB  1 g Intravenous Once Gareth Morgan, MD      . cefTRIAXone (ROCEPHIN) 1 g in dextrose 5 % 50 mL IVPB  1 g Intravenous Q24H Eulogio Bear U, DO   Stopped at 05/22/16 2204  . clopidogrel (PLAVIX) tablet 75 mg  75 mg Oral Daily Eulogio Bear U, DO   75 mg at 05/22/16 0901  . DULoxetine (CYMBALTA) DR capsule 60 mg  60 mg Oral BID Eulogio Bear U, DO   60 mg at 05/22/16 2135  . fluticasone (FLONASE) 50 MCG/ACT nasal spray 2 spray  2 spray Each Nare Daily PRN Eulogio Bear U, DO      . gabapentin (NEURONTIN) tablet 600 mg  600 mg Oral BID Eulogio Bear U, DO   600 mg at 05/22/16 2135  . heparin injection 5,000 Units  5,000 Units Subcutaneous Q8H Vann, Jessica U, DO   5,000 Units at 05/23/16 0555  . insulin  aspart (novoLOG) injection 0-9 Units  0-9 Units Subcutaneous TID WC Vann, Jessica U, DO   1 Units at 05/22/16 1315  . insulin detemir (LEVEMIR) injection 5 Units  5 Units Subcutaneous QHS Barton Dubois, MD   5 Units at 05/22/16 2137  . ipratropium-albuterol (DUONEB) 0.5-2.5 (3) MG/3ML nebulizer solution 3 mL  3 mL Nebulization Q6H PRN Vann, Jessica U, DO      . lisinopril (PRINIVIL,ZESTRIL) tablet 5 mg  5 mg Oral BID Vann, Jessica U, DO   5 mg at 05/22/16 2138  . loratadine (CLARITIN) tablet 10 mg  10 mg Oral Daily Eulogio Bear U, DO   10 mg at 05/22/16 0901  . metoprolol succinate (TOPROL-XL) 24 hr tablet 50 mg  50 mg Oral Daily Vann, Jessica U, DO   50 mg at 05/22/16 0900  . pantoprazole (PROTONIX) EC tablet 40 mg  40 mg Oral Daily Eulogio Bear U, DO   40 mg at 05/22/16 0901  . phenol (CHLORASEPTIC) mouth spray 1 spray  1 spray Mouth/Throat Q4H PRN Eulogio Bear U, DO      . polyethylene glycol (MIRALAX / GLYCOLAX) packet 17 g  17 g Oral Daily Wynell Balloon, RPH   17 g at 05/22/16 0859  . prednisoLONE acetate (PRED FORTE) 1 % ophthalmic suspension 1 drop  1 drop Both Eyes BID Eulogio Bear U, DO   1 drop at 05/22/16 2134  . QUEtiapine (SEROQUEL) tablet 25 mg  25 mg Oral BID Eulogio Bear U, DO   25 mg at 05/22/16 2134  . rivastigmine (EXELON) capsule 1.5 mg  1.5 mg Oral BID Eulogio Bear U, DO   1.5 mg at 05/22/16 2134  . tiZANidine (ZANAFLEX) tablet 2 mg  2 mg Oral BID Eulogio Bear U, DO   2 mg at 05/22/16 2135  . tiZANidine (ZANAFLEX) tablet 2 mg  2 mg Oral Q8H PRN Geradine Girt, DO      . traMADol (ULTRAM) tablet 25 mg  25 mg Oral Q6H PRN Eulogio Bear U, DO   25 mg at 05/22/16 9728     Discharge Medications: Please see discharge summary for a list of discharge medications.  Relevant Imaging Results:  Relevant Lab Results:   Additional Information SSN: [id number]  Benard Halsted, LCSWA

## 2016-05-23 NOTE — Discharge Summary (Signed)
[facility] Discharge Summary   Patient: Katherine Carr VOH:[id number]   PCP: Wenda Low, MD DOB: [birth year]   Date of admission: 05/21/2016   Date of discharge:  05/23/2016    Discharge Diagnoses:  Active Problems:   Type 2 diabetes mellitus with hypoglycemia without coma (HCC)   OBESITY   Essential hypertension   Lower urinary tract infectious disease   Parkinson disease ([place])   UTI (urinary tract infection)   AKI (acute kidney injury) ([place])   Hypoglycemia due to insulin   Admitted From: SNF Disposition:  SNF  Recommendations for Outpatient Follow-up:  1. Please follow up with PCP in 1 week, need to follow up on pending urine culture.  2. ENCOURAGE PO FLUIDS AND BOWEL REGIMEN  Follow-up Information    Wenda Low, MD. Schedule an appointment as soon as possible for a visit in 1 week(s).   Specialty:  Internal Medicine Contact information: [address] [place] [address] [phone]          Diet recommendation: cardiac and carb modified diet  Activity: The patient is advised to gradually reintroduce usual activities.  Discharge Condition: good  Code Status: DNR DNI  History of present illness: As per the H and P dictated on admission, "Katherine Carr is a 76 y.o. female with medical history significant of DM, CVA, blindness who is a long term resident of [facility] (8 years).  Family reports a steady decline in appetite over last few weeks.  She is blind and has to be fed her meals.  This AM staff reported that patient was difficult to arouse and more agitated than normal.  Family saw her this AM and she was her normal self but c/o shaking, knew who he was (husband).  Husband went to bathroom and when he returned, she was shaking all over.    In the ER, her glucose was found to be 52.  Urine was consistent with a UTI.  Otherwise labs were unremarkable."  Hospital Course:  Summary of her active problems in the hospital is as  following. 1-UTI -Urine culture growing gram negative rods.  -Clinically getting better, blood culture negative.  - will change to oral keflex, was on vantin in march, follow culture. But can be discharge back to SNF.  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 -STOP LISINOPRIL AND CHANGE LASIX TO PRN.  3-type 2 diabetes mellitus chronically on insulin: With hypoglycemia  -Most likely secondary to decreased by mouth intake and also worsening renal function.  -Will continue sliding scale insulin -Follow CBGs and adjust hypoglycemic regimen as needed  - CHANGED LEVEMIR DOSE AND STOPPED SCHEDULED SHORT ACTING INSULIN  4-history of CVA with left residual hemiparesis:  -Continue Plavix for secondary prevention -Continue treatment of her risk factors (hypertension, diabetes, etc...) -No new focal deficit appreciated.  5-seizure like activity. SEIZURES RULED OUT -Patient was able to speak through the episode and breathing have any post ictal, neither bladder or stool incontinence. -There was no signs of tongue biting -Most likely triggered by hypoglycemia in the presence of UTI.  6-Parkinson disease and dementia  -Continue Sinemet and Exelon patch  -Continue supportive care.   7-hypertension  -Stable overall -Will resume home antihypertensive regimen. - STOP LISINOPRIL AND CHANGE LASIX TO PRN.  -Monitor vital signs.  8-GERD -continue PPI  9-depression -Continue Cymbalta -No hallucinations or suicidal ideation appreciated.   10-obesity -Body mass index is 34.16 kg/m. -Low calorie diet and healthy options discussed with patient.  All other chronic medical condition were stable during the hospitalization.  Patient was seen by physical therapy, SNF, which was  arranged by Education officer, museum and case Freight forwarder. On the day of the discharge the patient's vitals were stable, and no other acute medical condition were reported by patient. the patient was felt safe to be discharge at SNF with therapt.  Procedures and Results:  none   Consultations:  none  DISCHARGE MEDICATION: Current Discharge Medication List    START taking these medications   Details  cephALEXin (KEFLEX) 500 MG capsule Take 1 capsule (500 mg total) by mouth every 12 (twelve) hours. Qty: 13 capsule, Refills: 0    insulin aspart (NOVOLOG) 100 UNIT/ML injection Inject 0-9 Units into the skin 3 (three) times daily with meals. Qty: 10 mL, Refills: 0      CONTINUE these medications which have CHANGED   Details  furosemide (LASIX) 20 MG tablet Take 1 tablet (20 mg total) by mouth daily as needed for fluid or edema. Qty: 30 tablet, Refills: 0    insulin detemir (LEVEMIR) 100 UNIT/ML injection Inject 0.05 mLs (5 Units total) into the skin at bedtime. Qty: 10 mL, Refills: 0      CONTINUE these medications which have NOT CHANGED   Details  acetaminophen (TYLENOL) 325 MG tablet Take 325 mg by mouth every 4 (four) hours as needed for moderate pain.     anastrozole (ARIMIDEX) 1 MG tablet Take 1 mg by mouth daily.    atropine 1 % ophthalmic solution Place 1 drop into both eyes twice daily    calcium carbonate (TUMS - DOSED IN MG ELEMENTAL CALCIUM) 500 MG chewable tablet Chew 1 tablet by mouth 3 (three) times daily.    carbidopa-levodopa (SINEMET) 25-100 MG tablet Take 1/2 tab twice daily x 3 days, then 1/2 tab three times daily x 4 days, then 1 tab three times daily Qty: 90 tablet, Refills: 0    Cholecalciferol (VITAMIN D3) 1000 UNITS CAPS Take 2,000 Units by mouth daily.    clopidogrel (PLAVIX) 75 MG tablet Take 75 mg by mouth daily.    Cranberry 450 MG TABS Take 450 mg by mouth 2 (two) times daily.     diclofenac sodium (VOLTAREN) 1 % GEL Apply 4 g topically 3 (three) times  daily as needed (joint pain).    docusate sodium 100 MG CAPS Take 100 mg by mouth 2 (two) times daily. Qty: 10 capsule, Refills: 0    DULoxetine (CYMBALTA) 60 MG capsule Take 60 mg by mouth 2 (two) times daily.    fluticasone (FLONASE) 50 MCG/ACT nasal spray Place 2 sprays into both nostrils daily as needed for rhinitis or allergies. Refills: 2    gabapentin (NEURONTIN) 600 MG tablet Take 600 mg by mouth 2 (two) times daily.     ipratropium-albuterol (DUONEB) 0.5-2.5 (3) MG/3ML SOLN Take 3 mLs by nebulization every 6 (six) hours as needed (shortness of breath). Qty: 360 mL, Refills: 2    loratadine (CLARITIN) 10 MG tablet Take 10 mg by mouth daily.    magnesium hydroxide (MILK OF MAGNESIA) 400 MG/5ML suspension Take 30 mLs by mouth daily as needed for mild constipation.    Melatonin 3 MG TABS Take 3 mg by mouth at bedtime.    !! Menthol, Topical Analgesic, (BIOFREEZE) 4 % GEL Apply 1 application topically  2 (two) times daily. Apply to buttock's    !! Menthol, Topical Analgesic, (BIOFREEZE) 4 % GEL Apply 1 application topically 4 (four) times daily as needed (for pain).    metoprolol succinate (TOPROL-XL) 50 MG 24 hr tablet Take 50 mg by mouth daily.     pantoprazole (PROTONIX) 40 MG tablet Take 1 tablet (40 mg total) by mouth 2 (two) times daily. Qty: 60 tablet, Refills: 0    phenol (SORE THROAT SPRAY) 1.4 % LIQD Use as directed 1 spray in the mouth or throat every 4 (four) hours as needed for throat irritation / pain.    phenylephrine-shark liver oil-mineral oil-petrolatum (PREPARATION H) 0.25-3-14-71.9 % rectal ointment Place 1 application rectally 2 (two) times daily as needed for hemorrhoids.    polyethylene glycol powder (GLYCOLAX/MIRALAX) powder Take 17 g by mouth daily.     pravastatin (PRAVACHOL) 20 MG tablet Take 20 mg by mouth daily.     prednisoLONE acetate (PRED FORTE) 1 % ophthalmic suspension Place 1 drop into both eyes 2 (two) times daily.     QUEtiapine  (SEROQUEL) 25 MG tablet Take 25 mg by mouth 2 (two) times daily.    rivastigmine (EXELON) 1.5 MG capsule Take 1.5 mg by mouth 2 (two) times daily.     Sennosides-Docusate Sodium (SENNA S PO) Take 1 tablet by mouth 2 (two) times daily.     tiZANidine (ZANAFLEX) 2 MG tablet Take 2 mg by mouth every 8 (eight) hours as needed for muscle spasms.    ALPRAZolam (XANAX) 0.5 MG tablet Take 1 tablet (0.5 mg total) by mouth at bedtime as needed for anxiety. Qty: 5 tablet, Refills: 0     !! - Potential duplicate medications found. Please discuss with provider.    STOP taking these medications     insulin lispro (HUMALOG) 100 UNIT/ML injection      lisinopril (PRINIVIL,ZESTRIL) 5 MG tablet      potassium chloride (K-DUR,KLOR-CON) 10 MEQ tablet      traMADol (ULTRAM) 50 MG tablet      cefpodoxime (VANTIN) 100 MG tablet        Allergies  Allergen Reactions  . Sertraline Hcl Hives  . Accolate [Zafirlukast]     Unknown reaction per MAR   . Avandia [Rosiglitazone] Hives and Swelling  . Codeine Hives and Swelling  . Cyclobenzaprine Swelling and Hives  . Furosemide Swelling    REACTION: face was swollen and hand  . Gadolinium Derivatives Swelling  . Hydrocodone-Acetaminophen Hives and Swelling  . Iohexol      Code: SOB, Desc: xray dye, Onset Date: [id number]   . Levofloxacin     REACTION: PANIC ATTACK,DIARRHEA,EYES DISCOLORD  . Lorazepam Nausea And Vomiting  . Lubiprostone Nausea Only    REACTION: REALLY BAD NAUSEA  . Metformin Hives and Swelling  . Metoclopramide Nausea And Vomiting and Swelling  . Oxycodone Hives and Swelling  . Promethazine Hcl     REACTION: TRAMA ANDJITTERS IN MY BODY  . Verapamil Hives and Nausea And Vomiting  . Fentanyl Anxiety    REACTION: PANIC ATTACKS  . Morphine Itching and Anxiety    REACTION: itching, panic attacks   Discharge Instructions    Diet - low sodium heart healthy    Complete by:  As directed    Discharge instructions    Complete by:  As  directed    It is important that you read following instructions as well as go over your medication list with RN to help you understand your  care after this hospitalization.  Discharge Instructions: Please follow-up with PCP in one week  Please request your primary care physician to go over all Hospital Tests and Procedure/Radiological results at the follow up,  Please get all Hospital records sent to your PCP by signing hospital release before you go home.   Do not take more than prescribed Pain, Sleep and Anxiety Medications. You were cared for by a hospitalist during your hospital stay. If you have any questions about your discharge medications or the care you received while you were in the hospital after you are discharged, you can call the unit and ask to speak with the hospitalist on call if the hospitalist that took care of you is not available.  Once you are discharged, your primary care physician will handle any further medical issues. Please note that NO REFILLS for any discharge medications will be authorized once you are discharged, as it is imperative that you return to your primary care physician (or establish a relationship with a primary care physician if you do not have one) for your aftercare needs so that they can reassess your need for medications and monitor your lab values. You Must read complete instructions/literature along with all the possible adverse reactions/side effects for all the Medicines you take and that have been prescribed to you. Take any new Medicines after you have completely understood and accept all the possible adverse reactions/side effects.   Increase activity slowly    Complete by:  As directed      Discharge Exam: Filed Weights   05/21/16 1124  Weight: 90.3 kg (199 lb)   Vitals:   05/22/16 2136 05/23/16 0638  BP: 103/64 (!) 111/93  Pulse: 86 65  Resp: 17 18  Temp: 98 F (36.7 C) 98.4 F (36.9 C)   General: Appear in mild distress, no  Rash; Oral Mucosa moist. Cardiovascular: S1 and S2 Present, no Murmur, no JVD Respiratory: Bilateral Air entry present and Clear to Auscultation, no Crackles, no wheezes Abdomen: Bowel Sound present, Soft and no tenderness Extremities: no Pedal edema, no calf tenderness Neurology: Grossly no focal neuro deficit.  The results of significant diagnostics from this hospitalization (including imaging, microbiology, ancillary and laboratory) are listed below for reference.    Significant Diagnostic Studies: Ct Abdomen Pelvis Wo Contrast  Result Date: 05/21/2016 CLINICAL DATA:  Abdominal pain.  Right lower quadrant. EXAM: CT ABDOMEN AND PELVIS WITHOUT CONTRAST TECHNIQUE: Multidetector CT imaging of the abdomen and pelvis was performed following the standard protocol without IV contrast. COMPARISON:  01/13/2009 FINDINGS: Lower chest: Scar identified within the left lower lobe. No pleural effusions. Hepatobiliary: No suspicious liver abnormality. Stones noted within the dependent portion of the gallbladder measure up to 5 mm. No biliary dilatation. Pancreas: Again noted are several small calcifications in the head of pancreas. No pancreatic ductal dilatation or surrounding inflammatory changes. Spleen: Upper limits of normal in length measuring 12 cm. No focal splenic abnormality identified within the limitations of unenhanced technique. Adrenals/Urinary Tract: Normal appearance of the adrenal glands. No nephrolithiasis or hydronephrosis. Urinary bladder appears normal. Stomach/Bowel: The stomach is normal. No dilated loops of small or large bowel. Unremarkable appearance of the colon. The appendix is difficult to identified separate from the right lower quadrant bowel loops. No secondary signs of acute appendicitis. Vascular/Lymphatic: Aortic atherosclerosis. No aneurysm. There is no upper abdominal adenopathy. No pelvic or inguinal adenopathy identified. Reproductive: Status post hysterectomy. No adnexal  masses. Other: No abdominal wall hernia or abnormality. No abdominopelvic  ascites. Musculoskeletal: Anterolisthesis of L4 on L5 noted. There is degenerative disc disease identified at L5-S1. Status post L5 laminectomy. IMPRESSION: 1. No acute findings identified within the abdomen or pelvis. Nonvisualization of the appendix. 2.  Aortic Atherosclerosis (ICD10-I70.0). 3. Gallstones 4. Lumbar spondylosis. Electronically Signed   By: Kerby Moors M.D.   On: 05/21/2016 15:03   Ct Head Wo Contrast  Result Date: 05/21/2016 CLINICAL DATA:  Agitation, altered mental status and seizure like activity today. Abdominal pain. History of diabetes, hypertension, breast cancer, migraines. EXAM: CT HEAD WITHOUT CONTRAST TECHNIQUE: Contiguous axial images were obtained from the base of the skull through the vertex without intravenous contrast. COMPARISON:  CT HEAD November 11, 2015 FINDINGS: BRAIN: No intraparenchymal hemorrhage, mass effect nor midline shift. The ventricles and sulci are normal for age. Patchy supratentorial white matter hypodensities within normal range for patient's age, though non-specific are most compatible with chronic small vessel ischemic disease. Old basal ganglia and RIGHT thalamus lacunar infarcts. No acute large vascular territory infarcts. No abnormal extra-axial fluid collections. Basal cisterns are patent. VASCULAR: Mild to moderate calcific atherosclerosis of the carotid siphons. SKULL: No skull fracture. Severe temporomandibular osteoarthrosis. No significant scalp soft tissue swelling. SINUSES/ORBITS: Trace LEFT mastoid effusion. Paranasal sinus are well aerated. The included ocular globes and orbital contents are non-suspicious. Status post bilateral ocular lens implants. OTHER: None. IMPRESSION: No acute intracranial process. Stable examination including old lacunar infarcts and moderate chronic small vessel ischemic disease. Electronically Signed   By: Elon Alas M.D.   On:  05/21/2016 14:56    Microbiology: Recent Results (from the past 240 hour(s))  Culture, Urine     Status: Abnormal (Preliminary result)   Collection Time: 05/21/16  2:12 PM  Result Value Ref Range Status   Specimen Description URINE, CATHETERIZED  Final   Special Requests NONE  Final   Culture (A)  Final    >=100,000 COLONIES/mL GRAM NEGATIVE RODS CULTURE REINCUBATED FOR BETTER GROWTH    Report Status PENDING  Incomplete  Blood culture (routine x 2)     Status: None (Preliminary result)   Collection Time: 05/21/16  6:06 PM  Result Value Ref Range Status   Specimen Description BLOOD RIGHT FOREARM  Final   Special Requests BOTTLES DRAWN AEROBIC ONLY BCAV  Final   Culture NO GROWTH 2 DAYS  Final   Report Status PENDING  Incomplete  Blood culture (routine x 2)     Status: None (Preliminary result)   Collection Time: 05/21/16  6:22 PM  Result Value Ref Range Status   Specimen Description BLOOD BLOOD RIGHT FOREARM  Final   Special Requests   Final    BOTTLES DRAWN AEROBIC AND ANAEROBIC Blood Culture adequate volume   Culture NO GROWTH 2 DAYS  Final   Report Status PENDING  Incomplete  MRSA PCR Screening     Status: None   Collection Time: 05/21/16 10:00 PM  Result Value Ref Range Status   MRSA by PCR NEGATIVE NEGATIVE Final    Comment:        The GeneXpert MRSA Assay (FDA approved for NASAL specimens only), is one component of a comprehensive MRSA colonization surveillance program. It is not intended to diagnose MRSA infection nor to guide or monitor treatment for MRSA infections.      Labs: CBC:  Recent Labs Lab 05/21/16 1320 05/22/16 0706 05/23/16 0448  WBC 10.1 4.5 3.8*  NEUTROABS 8.4*  --   --   HGB 11.9* 10.6* 9.8*  HCT 35.8* 32.0*  30.2*  MCV 95.5 95.8 95.9  PLT 213 175 021*   Basic Metabolic Panel:  Recent Labs Lab 05/21/16 1320 05/22/16 0706 05/23/16 0448  NA 141 140 140  K 4.4 4.6 4.4  CL 104 106 107  CO2 27 24 23   GLUCOSE 52* 134* 114*  BUN  38* 31* 23*  CREATININE 1.74* 1.54* 1.58*  CALCIUM 9.8 9.1 8.8*   Liver Function Tests:  Recent Labs Lab 05/21/16 1320  AST 20  ALT 7*  ALKPHOS 106  BILITOT 0.7  PROT 7.2  ALBUMIN 3.8   CBG:  Recent Labs Lab 05/22/16 1157 05/22/16 1742 05/22/16 2124 05/23/16 0740 05/23/16 1224  GLUCAP 138* 109* 144* 93 126*   Time spent: 35 minutes  Signed:  Madox Corkins  [facility]  05/23/2016  , 12:43 PM

## 2016-05-23 NOTE — Progress Notes (Signed)
Pt d/c'd to SNF at this time. [facility] transportation picked pt up, with her belongings.  RN Ginger seen to pt prior to leaving unit.

## 2016-05-23 NOTE — Clinical Social Work Placement (Signed)
   CLINICAL SOCIAL WORK PLACEMENT  NOTE  Date:  05/23/2016  Patient Details  Name: Katherine Carr MRN: [id number] Date of Birth: 1940-11-25  Clinical Social Work is seeking post-discharge placement for this patient at the [place] level of care (*CSW will initial, date and re-position this form in  chart as items are completed):  Yes   Patient/family provided with [facility] Work Department's list of facilities offering this level of care within the geographic area requested by the patient (or if unable, by the patient's family).  Yes   Patient/family informed of their freedom to choose among providers that offer the needed level of care, that participate in Medicare, Medicaid or managed care program needed by the patient, have an available bed and are willing to accept the patient.  Yes   Patient/family informed of [facility]'s ownership interest in [facility] and [facility], as well as of the fact that they are under no obligation to receive care at these facilities.  PASRR submitted to EDS on       PASRR number received on       Existing PASRR number confirmed on 05/23/16     FL2 transmitted to all facilities in geographic area requested by pt/family on       FL2 transmitted to all facilities within larger geographic area on       Patient informed that his/her managed care company has contracts with or will negotiate with certain facilities, including the following:        Yes   Patient/family informed of bed offers received.  Patient chooses bed at [facility]     Physician recommends and patient chooses bed at      Patient to be transferred to [facility] on 05/23/16.  Patient to be transferred to facility by [facility]     Patient family notified on 05/23/16 of transfer.  Name of family member notified:  attempted to contact [place] Dominique [phone]     PHYSICIAN       Additional  Comment:    _______________________________________________ Wende Neighbors, LCSW 05/23/2016, 2:33 PM

## 2016-05-23 NOTE — Progress Notes (Signed)
Clinical Social Worker facilitated patient discharge including contacting patient family and facility to confirm patient discharge plans.  Clinical information faxed to facility and family agreeable with plan.  CSW arranged ambulance transport via [facility] to [facility] .  RN to call [phone] (and ask for nurse that has room 702 B)report prior to discharge.  Clinical Social Worker will sign off for now as social work intervention is no longer needed. Please consult [place] again if new need arises.  Rhea Pink, MSW, [place]

## 2016-05-25 LAB — URINE CULTURE: Culture: >=100000 — AB

## 2016-05-26 LAB — CULTURE, BLOOD (ROUTINE X 2)
CULTURE: NO GROWTH
CULTURE: NO GROWTH
SPECIAL REQUESTS: ADEQUATE

## 2016-06-02 NOTE — Addendum Note (Signed)
Addendum  created 06/02/16 0825 by Duane Boston, MD   Sign clinical note

## 2016-11-27 ENCOUNTER — Telehealth: Payer: Self-pay

## 2016-11-27 NOTE — Telephone Encounter (Signed)
NP at [facility] called. She was asking when the pt was last seen at [facility]. The pt is on anastrozole.  Her  last visit was 05/25/14. Her f/u visit was cancelled by pt. inbasket sent for appt with lab and MD. The appt information needs to be called to [phone] and ask for Sula Soda who is the secretary that coordinates scheduling and transportation.

## 2016-12-03 ENCOUNTER — Telehealth: Payer: Self-pay | Admitting: Oncology

## 2016-12-03 NOTE — Telephone Encounter (Signed)
Spoke to appointment coordinator regarding upcoming December  appointments per 11/27 sch message

## 2016-12-17 ENCOUNTER — Telehealth: Payer: Self-pay | Admitting: *Deleted

## 2016-12-17 NOTE — Telephone Encounter (Signed)
"  Katherine Carr with [facility] calling to cancel tomorrow's appointment with Dr. Jana Hakim.  Nothing going on or wrong with her.  Her family wants to cancel"   Appointments cancelled as requested.   .Routing call information to collaborative nurse and provider for review.  Further patient communication through collaborative nurse.

## 2016-12-18 ENCOUNTER — Ambulatory Visit: Payer: Medicare Other | Admitting: Oncology

## 2016-12-18 ENCOUNTER — Other Ambulatory Visit: Payer: Medicare Other

## 2019-03-02 DEATH — deceased
